# Patient Record
Sex: Female | Born: 1960 | Race: White | Marital: Single | Smoking: Former smoker
Health system: Southern US, Community
[De-identification: ages and names within clinical notes are randomized; demographics above are authoritative.]

## PROBLEM LIST (undated history)

## (undated) DIAGNOSIS — Z9359 Other cystostomy status: Secondary | ICD-10-CM

## (undated) DIAGNOSIS — N390 Urinary tract infection, site not specified: Secondary | ICD-10-CM

## (undated) DIAGNOSIS — R0602 Shortness of breath: Secondary | ICD-10-CM

## (undated) DIAGNOSIS — M797 Fibromyalgia: Secondary | ICD-10-CM

## (undated) DIAGNOSIS — G609 Hereditary and idiopathic neuropathy, unspecified: Secondary | ICD-10-CM

## (undated) DIAGNOSIS — G479 Sleep disorder, unspecified: Secondary | ICD-10-CM

## (undated) DIAGNOSIS — I10 Essential (primary) hypertension: Secondary | ICD-10-CM

## (undated) DIAGNOSIS — R41840 Attention and concentration deficit: Secondary | ICD-10-CM

## (undated) DIAGNOSIS — F332 Major depressive disorder, recurrent severe without psychotic features: Secondary | ICD-10-CM

## (undated) DIAGNOSIS — Z9289 Personal history of other medical treatment: Secondary | ICD-10-CM

## (undated) DIAGNOSIS — A6920 Lyme disease, unspecified: Secondary | ICD-10-CM

## (undated) DIAGNOSIS — D509 Iron deficiency anemia, unspecified: Secondary | ICD-10-CM

## (undated) DIAGNOSIS — IMO0001 Reserved for inherently not codable concepts without codable children: Secondary | ICD-10-CM

## (undated) DIAGNOSIS — R683 Clubbing of fingers: Secondary | ICD-10-CM

## (undated) DIAGNOSIS — R160 Hepatomegaly, not elsewhere classified: Secondary | ICD-10-CM

## (undated) DIAGNOSIS — M6208 Separation of muscle (nontraumatic), other site: Secondary | ICD-10-CM

## (undated) DIAGNOSIS — E039 Hypothyroidism, unspecified: Secondary | ICD-10-CM

## (undated) DIAGNOSIS — R109 Unspecified abdominal pain: Secondary | ICD-10-CM

## (undated) DIAGNOSIS — K5900 Constipation, unspecified: Secondary | ICD-10-CM

## (undated) DIAGNOSIS — J449 Chronic obstructive pulmonary disease, unspecified: Secondary | ICD-10-CM

## (undated) DIAGNOSIS — E876 Hypokalemia: Secondary | ICD-10-CM

## (undated) DIAGNOSIS — K219 Gastro-esophageal reflux disease without esophagitis: Secondary | ICD-10-CM

## (undated) DIAGNOSIS — F419 Anxiety disorder, unspecified: Secondary | ICD-10-CM

## (undated) DIAGNOSIS — G8929 Other chronic pain: Secondary | ICD-10-CM

## (undated) DIAGNOSIS — G932 Benign intracranial hypertension: Secondary | ICD-10-CM

## (undated) DIAGNOSIS — G4733 Obstructive sleep apnea (adult) (pediatric): Secondary | ICD-10-CM

## (undated) DIAGNOSIS — G894 Chronic pain syndrome: Secondary | ICD-10-CM

## (undated) DIAGNOSIS — E1159 Type 2 diabetes mellitus with other circulatory complications: Secondary | ICD-10-CM

## (undated) DIAGNOSIS — R262 Difficulty in walking, not elsewhere classified: Secondary | ICD-10-CM

## (undated) DIAGNOSIS — R531 Weakness: Secondary | ICD-10-CM

## (undated) DIAGNOSIS — E119 Type 2 diabetes mellitus without complications: Secondary | ICD-10-CM

## (undated) DIAGNOSIS — R609 Edema, unspecified: Secondary | ICD-10-CM

## (undated) DIAGNOSIS — Z9981 Dependence on supplemental oxygen: Secondary | ICD-10-CM

## (undated) DIAGNOSIS — R251 Tremor, unspecified: Secondary | ICD-10-CM

## (undated) DIAGNOSIS — D649 Anemia, unspecified: Secondary | ICD-10-CM

## (undated) DIAGNOSIS — M533 Sacrococcygeal disorders, not elsewhere classified: Secondary | ICD-10-CM

## (undated) HISTORY — PX: DENTAL RESTORATION/EXTRACTION WITH X-RAY: SHX5796

## (undated) HISTORY — DX: Type 2 diabetes mellitus with other circulatory complications: E11.59

## (undated) HISTORY — DX: Iron deficiency anemia, unspecified: D50.9

## (undated) HISTORY — DX: Sacrococcygeal disorders, not elsewhere classified: M53.3

## (undated) HISTORY — DX: Hepatomegaly, not elsewhere classified: R16.0

## (undated) HISTORY — DX: Separation of muscle (nontraumatic), other site: M62.08

## (undated) HISTORY — DX: Major depressive disorder, recurrent severe without psychotic features: F33.2

## (undated) HISTORY — DX: Clubbing of fingers: R68.3

## (undated) HISTORY — DX: Hereditary and idiopathic neuropathy, unspecified: G60.9

## (undated) HISTORY — DX: Type 2 diabetes mellitus without complications: E11.9

## (undated) HISTORY — PX: APPENDECTOMY: SHX54

## (undated) HISTORY — DX: Unspecified abdominal pain: R10.9

## (undated) HISTORY — DX: Attention and concentration deficit: R41.840

## (undated) HISTORY — PX: OTHER SURGICAL HISTORY: SHX169

## (undated) HISTORY — DX: Constipation, unspecified: K59.00

## (undated) HISTORY — DX: Difficulty in walking, not elsewhere classified: R26.2

## (undated) HISTORY — DX: Chronic pain syndrome: G89.4

## (undated) HISTORY — DX: Anxiety disorder, unspecified: F41.9

## (undated) HISTORY — DX: Dependence on supplemental oxygen: Z99.81

## (undated) HISTORY — PX: FOOT SURGERY: SHX648

## (undated) HISTORY — DX: Tremor, unspecified: R25.1

## (undated) HISTORY — DX: Benign intracranial hypertension: G93.2

## (undated) HISTORY — DX: Essential (primary) hypertension: I10

## (undated) HISTORY — DX: Hypokalemia: E87.6

## (undated) HISTORY — DX: Edema, unspecified: R60.9

## (undated) HISTORY — DX: Other cystostomy status: Z93.59

## (undated) HISTORY — DX: Reserved for inherently not codable concepts without codable children: IMO0001

---

## 1991-12-28 DIAGNOSIS — A6920 Lyme disease, unspecified: Secondary | ICD-10-CM

## 1991-12-28 HISTORY — DX: Lyme disease, unspecified: A69.20

## 2012-03-02 DIAGNOSIS — Z993 Dependence on wheelchair: Secondary | ICD-10-CM | POA: Insufficient documentation

## 2012-09-18 DIAGNOSIS — E662 Morbid (severe) obesity with alveolar hypoventilation: Secondary | ICD-10-CM | POA: Insufficient documentation

## 2013-10-19 ENCOUNTER — Non-Acute Institutional Stay (SKILLED_NURSING_FACILITY): Payer: Medicare Other | Admitting: Internal Medicine

## 2013-10-19 DIAGNOSIS — I10 Essential (primary) hypertension: Secondary | ICD-10-CM

## 2013-10-19 DIAGNOSIS — E1159 Type 2 diabetes mellitus with other circulatory complications: Secondary | ICD-10-CM

## 2013-10-19 DIAGNOSIS — R300 Dysuria: Secondary | ICD-10-CM

## 2013-10-19 DIAGNOSIS — J441 Chronic obstructive pulmonary disease with (acute) exacerbation: Secondary | ICD-10-CM

## 2013-10-19 DIAGNOSIS — K5900 Constipation, unspecified: Secondary | ICD-10-CM

## 2013-10-19 DIAGNOSIS — F32A Depression, unspecified: Secondary | ICD-10-CM | POA: Insufficient documentation

## 2013-10-19 DIAGNOSIS — G609 Hereditary and idiopathic neuropathy, unspecified: Secondary | ICD-10-CM

## 2013-10-19 DIAGNOSIS — G894 Chronic pain syndrome: Secondary | ICD-10-CM | POA: Insufficient documentation

## 2013-10-19 DIAGNOSIS — E039 Hypothyroidism, unspecified: Secondary | ICD-10-CM

## 2013-10-19 DIAGNOSIS — E1151 Type 2 diabetes mellitus with diabetic peripheral angiopathy without gangrene: Secondary | ICD-10-CM | POA: Insufficient documentation

## 2013-10-19 DIAGNOSIS — D500 Iron deficiency anemia secondary to blood loss (chronic): Secondary | ICD-10-CM

## 2013-10-19 DIAGNOSIS — F3289 Other specified depressive episodes: Secondary | ICD-10-CM

## 2013-10-19 DIAGNOSIS — F329 Major depressive disorder, single episode, unspecified: Secondary | ICD-10-CM

## 2013-10-19 DIAGNOSIS — G4733 Obstructive sleep apnea (adult) (pediatric): Secondary | ICD-10-CM

## 2013-10-19 DIAGNOSIS — E1149 Type 2 diabetes mellitus with other diabetic neurological complication: Secondary | ICD-10-CM

## 2013-10-19 HISTORY — DX: Hereditary and idiopathic neuropathy, unspecified: G60.9

## 2013-10-19 HISTORY — DX: Chronic pain syndrome: G89.4

## 2013-10-19 HISTORY — DX: Constipation, unspecified: K59.00

## 2013-10-19 HISTORY — DX: Type 2 diabetes mellitus with other circulatory complications: E11.59

## 2013-10-19 NOTE — Progress Notes (Signed)
Patient ID: [name], female   DOB: 31-Dec-1960, 52 y.o.   MRN: [id number]  This is an acute visit.  I will care skilled.  Facilities [facility].  Chief complaint-acute visit status post transfer to our service from another facility  History of present illness.  Patient is a 52 year old female with a very complex medical history.  She had been in a skilled facility in [place] and is transferring here to be closer to her parents who live in [place].  She apparently has an extensive medical history including morbid obesity fibromyalgia chronic fatigue-also a history of diabetes type 2 hypothyroidism anemia which has required transfusion in the past.  Also a history of COPD and respiratory failure complicated at times with pneumonia.  She appears to have an extensive history of pain control and i her medications include methadone as well as oxycodone 10 mg every 8 hours-- also is receiving a muscle relaxer.   she does complain of constipation at times--she did receive an enema last night apparently with good results said she had some relief she is on numerous agenst  She also has a history of hypertension blood pressures currently appear to be fairly well controlled most recently 128/70 132/76.  She is a type II diabetic she is on NovoLog 70 30---34 units blood sugars so far in the higher 100 lower 200 range at this point will monitor.  Today her main complaint is some dysuria she does have a suprapubic catheter that is draining light amber colored urine-.  She says she has a history of frequent UTIs.  Family medical social history.  As stated above-patient is single she did have a partner in [place] apparently that relationship has ended.  Her parents living [place] and that is why apparently she is here  Previous medical history.  History of morbid obesity.  Diabetes type 2.  Hypothyroidism.  Hypertension.  COPD.  Pneumonia.  Constipation.  Chronic  pain.  Coronary artery disease.   appears to have a history of neurogenic bladder  Review of systems.  In general no complaints of fever or chills.  Skin-does not complaining of itching or bruising today.  Eyes-states she thinks she has diabetic retinopathy-says occasionally she will see black spots in her visual fields this has been going on for several months-also complains of dry eyes.  Throat-complains of dry throat but does not complaining of dysphagia.  Respiratory does not complaining of shortness of breath or cough-she does have history of COPD.  Cardiac-does not complaining of chest pain appears to have some history of coronary artery disease has when necessary nitroglycerin.  GI-history of significant constipation on numerous agents apparently had a large bowel movement yesterday although has still concerned about constipation.  GU-appears to have some history of neurogenic bladder with a suprapubic catheter does complain of some dysuria.  Muscle skeletal has diffuse joint pain this is chronic on numerous agents as noted above.  Neurologic history of peripheral neuropathy she says the Neurontin helps significantly does not complaining of headache or dizziness.  Psych does have a history of significant depression is on Paxil as well as Wellbutrin.  Physical exam.  Temperature 98.6 pulse 90 respirations 18 blood pressure 128/70 her weight is 287.  In general this is a pleasant morbidly obese middle-aged female in no distress resting comfortably in bed.  Her skin is warm and dry.  Eyes she does have prescription lenses pupils are. We'll round reactive light sclera and conjunctiva are clear extraocular movements intact visual acuity  appears grossly intact.  Oropharynx is clear she has dentures upper and lower plates mucous membranes moist.  Chest is clear to auscultation with somewhat reduced breath sounds I suspect this is due to obesity could not appreciate any  rhonchi rales or wheezes.  Heart is regular rate and rhythm without murmur gallop or rub.  Abdomen she is morbidly obese protuberant soft with positive bowel sounds.  GU-he does have a suprapubic catheter there is some suprapubic tenderness it is draining amber colored urine.  Muscle skeletal does move all extremities x4 has obese changes to her lower extremities which limits her range of motion.  Neurologic difficult exam patient is very sensitive lower extremities when areas attempted to be palpated--clinically pulling her legs away.   Circulation-appear to have somewhat reduced peripheral pulses bilaterally.Marland Kitchen  Psych she is alert and oriented x3 pleasant and appropriate.  Labs none available from the current data we have received from previous nursing facility.  Assessment and plan.  #1 history of chronic pain-she is on numerous agents including methadone 30 mg every 8 hours as well as oxycodone 10 mg every 8 hours-he also continues on Zanaflex when necessary which appear she takes about once a day continue to monitor I suspect this will be a challenge.  #2 constipation again she is on numerous agents including Colace fleets enema every 3 days when necessary as well as Dulcolax milk of magnesia when necessary she is also receiving -she is still complaining of some constipation Will obtain an abdominal x-ray to rule out anything acute.  #3 hypertension-at this point appears to be fairly well controlled she is  HCTZ on clonidine when necessary--will check a metabolic panel  #4-dysuria-she does have a significant history of UTIs per patient's report with I suspect a neurogenic bladder well check a urine culture.  #5-diabetes type 2-she is on Novolog-70//30-since we have minimal data will continue to monitor for now.  #6 history of neuropathy-she is on Neurontin she says this helps significantly we will continue to monitor.  #7-history of dry eyes-will order topical eyedrops and  monitor.  #8 history of hypothyroidism she is on Synthroid we'll update ATS H.  #9 history COPD she does continue on nebulizers at this point appears stable.  #10-history of depression I suspect this is been quite significant she does continue on Paxil as well as Wellbutrin.  Of note we'll order a CBC CMP and TSH for updated values  and hopefully give [place] an indication of what her baseline might be  [id number]-of note greater than 45 minutes spent assessing patient-discussing her status with nursing staff as well with the patient at bedside-and coordinating and formulating a plan of care for numerous diagnoses    .

## 2013-10-23 ENCOUNTER — Non-Acute Institutional Stay (SKILLED_NURSING_FACILITY): Payer: Medicare Other | Admitting: Internal Medicine

## 2013-10-23 DIAGNOSIS — J9610 Chronic respiratory failure, unspecified whether with hypoxia or hypercapnia: Secondary | ICD-10-CM

## 2013-10-23 DIAGNOSIS — IMO0001 Reserved for inherently not codable concepts without codable children: Secondary | ICD-10-CM

## 2013-10-23 DIAGNOSIS — M797 Fibromyalgia: Secondary | ICD-10-CM

## 2013-10-23 DIAGNOSIS — G4733 Obstructive sleep apnea (adult) (pediatric): Secondary | ICD-10-CM

## 2013-10-23 DIAGNOSIS — D500 Iron deficiency anemia secondary to blood loss (chronic): Secondary | ICD-10-CM
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
## 2013-10-27 DIAGNOSIS — F332 Major depressive disorder, recurrent severe without psychotic features: Secondary | ICD-10-CM

## 2013-10-27 HISTORY — DX: Major depressive disorder, recurrent severe without psychotic features: F33.2

## 2013-10-30 ENCOUNTER — Non-Acute Institutional Stay (SKILLED_NURSING_FACILITY): Payer: Medicare Other | Admitting: Internal Medicine

## 2013-10-30 DIAGNOSIS — D500 Iron deficiency anemia secondary to blood loss (chronic): Secondary | ICD-10-CM

## 2013-10-30 NOTE — Progress Notes (Signed)
Patient ID: [name], female   DOB: 11-Jan-1961, 52 y.o.   MRN: [id number] Facility; [facility] Chief complaint followup anemia History; this is a patient I admitted to the facility from another nursing facility. She is a medically complex 52 year old woman who arrived with virtually no useful medical information. Her medical issues was fairly severe anemia and a history of being transfused on 5 different occasions. She gives a history of both bleeding from chronic interstitial cystitis with a chronic Foley catheter as well as rectal bleeding. She has not allowed a rectal exam and has not allowed the nurses to do a digital guaiac test her hemoglobin on November 3 was 6.9 MCV of 74.6 MCHC of 21.4 white count is 5.7 platelet count at 258 differential count on the white count is normal. I had put out a fairly urgent request for medical records. I have received a discharge summary from [facility] in [place]. This is dated 08/18/2013. With regards to her anemia her hemoglobin was 7.5 on presentation to their emergency room. There is no further information on this it is not stated she was transfused she was listed as being on ferrous sulfate at 325 twice a day as well as Protonix and Carafate. However the patient tells me she will not take either oral or intravenous iron. She states she has a severe "almost allergic reaction to both of these" therefore she has been refusing the oral iron that she was on on arrival here. Furthermore she tells me that she does not wish to be transfused at least at this moment. She states she does not have enough quality of life to justify prolonging it. Nor will she except iron in any form.   Physical examination O2 sat is 96% on 2 L respirations 18 pulse rate 77 Cardiac 2/6 pansystolic murmur noted at the lower left sternal border. Jugular venous pressure is not elevated. Mental status; clearly an element of depression here note that she has been seen by  psychiatry in the facility started on Effexor on October 28  Imession/plan #1 microcytic hypochromic anemia; at this point I am not completely certain whether this is iron deficiency or anemia of chronic disease. The physician notes from presumably a discharge summary from [facility] states this is chronic disease. Patient is not allowing stool guaiacs, nor does she currently want to be transfused although I will leave this open for further discussion with her. She will not take iron in any form. I note that her reticulocyte percent was 3.5% [slightly elevated] however absolute reticulocyte count is 113.1. Vitamin B12 and folate are pending. I will repeat her hemoglobin and iron studies in 2 weeks and attempt to discuss this with her again. Patient states she has actually had troubles with anemia since her teenage years. Nevertheless I am troubled by the absence of a concrete diagnosis.

## 2013-11-12 ENCOUNTER — Other Ambulatory Visit: Payer: Self-pay | Admitting: *Deleted

## 2013-11-12 MED ORDER — OXYCODONE HCL 10 MG PO TABS: ORAL_TABLET | ORAL | Status: DC

## 2013-11-13 ENCOUNTER — Non-Acute Institutional Stay (SKILLED_NURSING_FACILITY): Payer: Medicare Other | Admitting: Internal Medicine

## 2013-11-13 DIAGNOSIS — F32A Depression, unspecified: Secondary | ICD-10-CM

## 2013-11-13 DIAGNOSIS — F329 Major depressive disorder, single episode, unspecified: Secondary | ICD-10-CM

## 2013-11-13 DIAGNOSIS — D500 Iron deficiency anemia secondary to blood loss (chronic): Secondary | ICD-10-CM

## 2013-11-13 DIAGNOSIS — F3289 Other specified depressive episodes: Secondary | ICD-10-CM

## 2013-11-13 NOTE — Progress Notes (Signed)
Patient ID: [name], female   DOB: 06-20-1961, 52 y.o.   MRN: [id number]  Facility; [facility] Chief complaint followup anemia History; this is a patient I admitted to the facility from another nursing facility. She is a medically complex 52 year old woman who arrived with virtually no useful medical information. Her medical issues was fairly severe anemia and a history of being transfused on 5 different occasions. She gives a history of both bleeding from chronic interstitial cystitis with a chronic Foley catheter as well as rectal bleeding. She has not allowed a rectal exam and has not allowed the nurses to do a digital guaiac test her hemoglobin on November 3 was 6.9 MCV of 74.6 MCHC of 21.4 white count is 5.7 platelet count at 258 differential count on the white count is normal. I had put out a fairly urgent request for medical records. I have received a discharge summary from [facility] in [place]. This is dated 08/18/2013. With regards to her anemia her hemoglobin was 7.5 on presentation to their emergency room. There is no further information on this it is not stated she was transfused she was listed as being on ferrous sulfate at 325 twice a day as well as Protonix and Carafate. However the patient tells me she will not take either oral or intravenous iron. She states she has a severe "almost allergic reaction to both of these" therefore she has been refusing the oral iron that she was on on arrival here. Furthermore she tells me that she does not wish to be transfused at least at this moment. She states she does not have enough quality of life to justify prolonging it. Nor will she except iron in any form.   Followup lab work shows a serum iron of less than 10, ferritin level is 3 sedimentation rate of 85 hemoglobin of 6.9, white count of 6.4 platelet count of 233,000 her MCV is 75 MCHC of 29 ALT is from November 17. Once again the patient reiterates today that she has been iron  deficient since her teenage years. She states her mother had the same thing. He had some form of allergic reaction to IV iron and some form of reaction to oral iron [question vomiting]. I tried to have a discussion with her that vomiting does not not necessarily preclude reattempting oral iron perhaps in a different form however she will not agree to this. Nor is she willing to entertain the idea of a transfusion at the moment although she wishes to leave that discussion open for another day  Physical examination O2 sat is 8% on 2 L respirations 18 pulse rate 82 Cardiac 2/6 pansystolic murmur noted at the lower left sternal border. Jugular venous pressure is not elevated. Abdomen; she has a large ventral hernia however this reduces easily. Her abdomen is otherwise distended. But no clear masses Mental status; clearly an element of depression here note that she has been seen by psychiatry in the facility started on Effexor on October 28  Imession/plan #1 microcytic hypochromic anemia; subsequent lab work has verified that this woman clearly has iron deficiency anemia. However the exact diagnosis is unclear. She has not allowed stool guaiacs. She will not agree to iron in any form of IV or oral. Furthermore she is steadfast in the idea that she does not want to be transfused. The exact diagnosis here is unclear, it is unlikely that this is malignancy given the length of time that this patient states that she has had this  problem [teenager]. She states she has been extensively evaluated in the past however I don't have any of these records. I will recheck her hemoglobin again in a month's time and rediscuss this with her. She will not allow me to do anything to stabilize her hemoglobin I can't see in attempting any further investigations. Otherwise certainly agree that she is depressed I am not at the moment thinking that this is an incompetent decision to refuse transfusion

## 2013-11-14 ENCOUNTER — Other Ambulatory Visit: Payer: Self-pay | Admitting: *Deleted

## 2013-11-14 MED ORDER — METHADONE HCL 10 MG PO TABS: ORAL_TABLET | ORAL | Status: DC

## 2013-11-27 ENCOUNTER — Non-Acute Institutional Stay (SKILLED_NURSING_FACILITY): Payer: Medicare Other | Admitting: Internal Medicine

## 2013-11-27 DIAGNOSIS — D500 Iron deficiency anemia secondary to blood loss (chronic): Secondary | ICD-10-CM

## 2013-11-27 DIAGNOSIS — M797 Fibromyalgia: Secondary | ICD-10-CM

## 2013-11-27 DIAGNOSIS — IMO0001 Reserved for inherently not codable concepts without codable children: Secondary | ICD-10-CM

## 2013-11-27 NOTE — Progress Notes (Signed)
Patient ID: [name], female   DOB: 1961-02-20, 52 y.o.   MRN: [id number] Facility ; [name] Chief complaint; multiple medical complaint History; this is a medically complex 52 year old woman I ask me received in transfer from another facility I believe in September of this year. Among her most significant medical issues is apparently chronic iron deficiency anemia for which the patient absolutely refuses iron and in the form including No oral iron, IV iron and she is not currently allowing transfusions although she has been transfused 5 times in the past according to her. I am following up on her hemoglobin in roughly 2 weeks. Which time I will attempt to rediscuss things with her. Her last hemoglobin was 7 on November 4 she has microcytic hypochromic anemia and iron studies that strongly suggest iron deficiency anemia with a percent saturation of 6% B12 and folate normal  She also has a chronic pain syndrome and has been on long-standing methadone 30 mg 3 times a day. I suspect this is probably a combination of osteoarthritis and fibromyalgia. The patient is adamant today that she has inadequate pain control and asked that she have every 4 when necessary analgesia which I don't think is unreasonable in her setting  She has not allowed any form of workup of her anemia including no stool guaiacs, as mentioned no transfusions or by mouth or IV iron and in light of this referral to other physicians in consultation would seem unreasonable. She's been followed by psychiatry in the facility and there've been adjustments to those medications and in general see seems better now on Effexor rather than Paxil  Impression/plan #1 severe chronic pain on methadone chronically I think it is reasonable to increase her oxycodone when necessary #2 iron deficiency anemia with a history of rectal bleeding and apparently chronic bleeding in her urinary tract according to patient; I have no information on any of  this #3 chronic pruritus this may be narcotic induced however the patient is having none of this. I'll give her something when necessary

## 2013-12-03 ENCOUNTER — Other Ambulatory Visit: Payer: Self-pay | Admitting: *Deleted

## 2013-12-03 MED ORDER — OXYCODONE HCL 10 MG PO TABS: ORAL_TABLET | ORAL | Status: DC

## 2013-12-14 ENCOUNTER — Other Ambulatory Visit: Payer: Self-pay | Admitting: *Deleted

## 2013-12-14 MED ORDER — METHADONE HCL 10 MG PO TABS: ORAL_TABLET | ORAL | Status: DC

## 2014-01-10 ENCOUNTER — Other Ambulatory Visit: Payer: Self-pay | Admitting: *Deleted

## 2014-01-10 MED ORDER — METHADONE HCL 10 MG PO TABS: ORAL_TABLET | ORAL | Status: DC

## 2014-01-15 ENCOUNTER — Non-Acute Institutional Stay (SKILLED_NURSING_FACILITY): Payer: Medicare Other | Admitting: Internal Medicine

## 2014-01-15 DIAGNOSIS — G4733 Obstructive sleep apnea (adult) (pediatric): Secondary | ICD-10-CM

## 2014-01-15 DIAGNOSIS — D500 Iron deficiency anemia secondary to blood loss (chronic): Secondary | ICD-10-CM

## 2014-01-15 NOTE — Progress Notes (Signed)
Patient ID: Karen BarrioChristy Walls, female   DOB: 09/23/1961, 53 y.o.   MRN: [id number] Facility ; [facility] Chief complaint; multiple medical complaints History; this is a medically complex 53 year old woman we received in transfer from another facility I believe in September of 2014. Among her most significant medical issues is apparently chronic iron deficiency anemia for which the patient absolutely refuses iron in form including No oral iron, IV iron and she is not currently allowing transfusions although she has been transfused 5 times in the past according to her. Her last hemoglobin was on 12/17 at which time her hemoglobin was 7 MCV 74.5 MCHC 21.5 her potassium was 3.3 total CO2 at 37. The patient clearly has iron deficiency with a ferritin of 3 an iron of less than 10 both of these on 11/17  She also has a chronic pain syndrome and has been on long-standing methadone 30 mg 3 times a day. I suspect this is probably a combination of osteoarthritis and fibromyalgia. The patient is adamant today that she has inadequate pain control and asked that she have every 4 when necessary analgesia which I don't think is unreasonable in her setting  She has not allowed any form of workup of her anemia including no stool guaiacs, as mentioned no transfusions or by mouth or IV iron and in light of this referral to other physicians in consultation would seem unreasonable. She's been followed by psychiatry in the facility and there've been adjustments to those medications and in general see seems better now on Effexor rather than Paxil   recent issues include anemia increasing shortness of breath, skin lesions on her scalp with generalized scalp pruritus but no history of psoriasis or seborrheic dermatitis.  Physical Exam; O2 sat 95% on 2 L respirations 18 and unlabored pulse rate 75 Respiratory shallow air entry bilaterally but no crackles or wheezes Cardiac heart sounds are distant no murmurs Lymph; in spite of  the patient's claim of swollen lymph nodes in her left cervical chain I feel nothing here there are no masses Skin; on the anterior part of her scalp in the midline and proximal to the her hairline her several ulcerations. A careful inspection of her scalp does not show evidence of any particular lesions although there may be a similar lesion to the front of her scalp at the top of her occiput. There is no evidence of a particular skin the lesion on her scalp nor evidence of an infestation.   Impression/plan #1 severe chronic pain on methadone chronically I think it is reasonable to increase her oxycodone when necessary #2 iron deficiency anemia with a history of rectal bleeding and apparently chronic bleeding in her urinary tract according to patient; I have no information on any of this. The patient does not allowed even a rudimentary workup. I will recheck her hemoglobin periodicly #3 scalp lesions. I am really uncertain about whether what I am seeing is the primary lesion or as a result of her scratching. I am going to treat her empirically for seborrheic dermatitis although the evidence for this seems scant. She will apparently not tolerate topical steroids

## 2014-02-07 ENCOUNTER — Other Ambulatory Visit: Payer: Self-pay | Admitting: *Deleted

## 2014-02-07 MED ORDER — OXYCODONE HCL 10 MG PO TABS: ORAL_TABLET | ORAL | Status: DC

## 2014-02-07 NOTE — Telephone Encounter (Signed)
[facility] 

## 2014-02-13 ENCOUNTER — Other Ambulatory Visit: Payer: Self-pay | Admitting: *Deleted

## 2014-02-13 MED ORDER — METHADONE HCL 10 MG PO TABS: ORAL_TABLET | ORAL | Status: DC

## 2014-02-13 NOTE — Telephone Encounter (Signed)
[facility] 

## 2014-02-18 ENCOUNTER — Non-Acute Institutional Stay (SKILLED_NURSING_FACILITY): Payer: Medicare Other | Admitting: Nurse Practitioner

## 2014-02-18 DIAGNOSIS — F32A Depression, unspecified: Secondary | ICD-10-CM

## 2014-02-18 DIAGNOSIS — L989 Disorder of the skin and subcutaneous tissue, unspecified: Secondary | ICD-10-CM

## 2014-02-18 DIAGNOSIS — G609 Hereditary and idiopathic neuropathy, unspecified: Secondary | ICD-10-CM

## 2014-02-18 DIAGNOSIS — J449 Chronic obstructive pulmonary disease, unspecified: Secondary | ICD-10-CM

## 2014-02-18 DIAGNOSIS — K5900 Constipation, unspecified: Secondary | ICD-10-CM

## 2014-02-18 DIAGNOSIS — I10 Essential (primary) hypertension: Secondary | ICD-10-CM

## 2014-02-18 DIAGNOSIS — F329 Major depressive disorder, single episode, unspecified: Secondary | ICD-10-CM

## 2014-02-18 DIAGNOSIS — F3289 Other specified depressive episodes: Secondary | ICD-10-CM

## 2014-02-18 DIAGNOSIS — E039 Hypothyroidism, unspecified: Secondary | ICD-10-CM

## 2014-02-18 DIAGNOSIS — D509 Iron deficiency anemia, unspecified: Secondary | ICD-10-CM

## 2014-02-18 DIAGNOSIS — E1159 Type 2 diabetes mellitus with other circulatory complications: Secondary | ICD-10-CM

## 2014-02-18 DIAGNOSIS — R300 Dysuria: Secondary | ICD-10-CM

## 2014-02-18 DIAGNOSIS — G894 Chronic pain syndrome: Secondary | ICD-10-CM

## 2014-02-18 HISTORY — DX: Iron deficiency anemia, unspecified: D50.9

## 2014-02-18 NOTE — Progress Notes (Signed)
Patient ID: [name], female   DOB: [birth year], 53 y.o.   MRN: [id number]    Nursing Home Location:  [place] of Service: SNF (31)  PCP: No primary provider on file.  Allergies not on file  Chief Complaint  Patient presents with  . Medical Managment of Chronic Issues    HPI:  53 year old female with a very complex medical history who is being seen today for routine follow up; pt moved to from a skilled facility in [place] and is transferring here to be closer to her parents who live in [place]. medical history includes HTN, morbid obesity, COPD, fibromyalgia and chronic pain, diabetes type 2, hypothyroidism,  anemia which has required transfusion in the past but is current refusing, she reports she wants to be transferred to an assisted living facility and then she may be willing to consider iron or an transfusion at that point however pt is unable to take care of herself in a assisted living environment; pt reports she needs therapy due to ongoing depression and anxiety, assures me she is not suicidal but tired of living the way she is living; pt is currently being seen by psych services at the facility  . Pt also complains about severe constipation complicated by narcotic use due to chronic pain Most recent complaints are dysuria she does have a suprapubic catheter and has a UA C&S pending; and ongoing scalp lesion but does not tolerate steroid cream and current shampoo is not helping  Pt also report worsening shortness of breath and chest pains; realizes her hgb is low and this is most likely the cause but refuses iron or transfusion.  Review of Systems:  Review of Systems  Constitutional: Negative for fever and chills.  Respiratory: Positive for shortness of breath (COPD; ongoing but progressively worse, no SHORTESS OF breath during  exam or interview ). Negative for cough and sputum production.   Cardiovascular: Positive for chest pain. Negative for  leg swelling.  Gastrointestinal: Positive for constipation. Negative for heartburn, abdominal pain and diarrhea.  Genitourinary: Positive for dysuria. Negative for hematuria and flank pain.  Musculoskeletal: Negative for myalgias.  Skin: Negative for itching and rash.  Neurological: Negative for dizziness and headaches.  Psychiatric/Behavioral: Positive for depression. The patient is nervous/anxious.      Medications: [name] Medications  New Prescriptions   No medications on file  Previous Medications   METHADONE (DOLOPHINE) 10 MG TABLET    Take three tablets by mouth every eight hours for pain   OXYCODONE HCL 10 MG TABS    Take one tablet by mouth every 4 hours as needed  Modified Medications   No medications on file  Discontinued Medications   No medications on file     Physical Exam: Blood pressure 127/66, HR 85, RR 20, Temp 97.6 Physical Exam  Constitutional: She is oriented to person, place, and time and well-developed, well-nourished, and in no distress.  HENT:  Mouth/Throat: Oropharynx is clear and moist. No oropharyngeal exudate.  Eyes: Conjunctivae and EOM are normal. Pupils are equal, round, and reactive to light.  Neck: Normal range of motion. Neck supple.  Cardiovascular: Normal rate, regular rhythm and normal heart sounds.   Pulmonary/Chest: Effort normal and breath sounds normal. No respiratory distress.  Abdominal: Soft. Bowel sounds are normal.  Musculoskeletal: She exhibits no edema.  Neurological: She is alert and oriented to person, place, and time.  Skin: Skin is warm and dry. Rash (to scalp) noted.  Labs reviewed: CBC NO Diff (Complete Blood Count)    Result: 10/21/2013 9:38 PM   ( Status: F )       WBC 6.3     4.0-10.5 K/uL SLN   RBC 3.52   L 3.87-5.11 MIL/uL SLN   Hemoglobin 7.7   L 12.0-15.0 g/dL SLN   Hematocrit 26.2   L 36.0-46.0 % SLN   MCV 74.4   L 78.0-100.0 fL SLN   MCH 21.9   L 26.0-34.0 pg SLN   MCHC 29.4    L 30.0-36.0 g/dL SLN   RDW 18.0   H 11.5-15.5 % SLN   Platelet Count 319     150-400 K/uL SLN   Comprehensive Metabolic Panel    Result: 10/21/2013 7:24 PM   ( Status: F )       Sodium 140     135-145 mEq/L SLN   Potassium 3.2   L 3.5-5.3 mEq/L SLN   Chloride 95   L 96-112 mEq/L SLN   CO2 37   H 19-32 mEq/L SLN   Glucose 155   H 70-99 mg/dL SLN   BUN 10     6-23 mg/dL SLN   Creatinine 0.91     0.50-1.10 mg/dL SLN   Bilirubin, Total 0.5     0.3-1.2 mg/dL SLN   Alkaline Phosphatase 73     39-117 U/L SLN   AST/SGOT 17     0-37 U/L SLN   ALT/SGPT 18     0-35 U/L SLN   Total Protein 7.3     6.0-8.3 g/dL SLN   Albumin 3.8     3.5-5.2 g/dL SLN   Calcium 8.9     8.4-10.5 mg/dL SLN   TSH, Ultrasensitive    Result: 10/21/2013 7:37 PM   ( Status: F )       TSH 2.519     0.350-4.500 uIU/mL SLN   Hemoglobin A1C    Result: 10/21/2013 10:20 PM   ( Status: F )       Hemoglobin A1C 6.8   H <5.7 % SLN C Estimated Average Glucose 148   H  CBC NO Diff (Complete Blood Count)    Result: 10/29/2013 10:24 AM   ( Status: F )     C WBC 5.7     4.0-10.5 K/uL SLN   RBC 3.23   L 3.87-5.11 MIL/uL SLN   Hemoglobin 6.9   LL 12.0-15.0 g/dL SLN   Hematocrit 24.1   L 36.0-46.0 % SLN   MCV 74.6   L 78.0-100.0 fL SLN   MCH 21.4   L 26.0-34.0 pg SLN   MCHC 28.6   L 30.0-36.0 g/dL SLN   RDW 17.9   H 11.5-15.5 % SLN   Platelet Count 258     150-400 K/uL SLN   CBC with Diff    Result: 10/29/2013 10:24 AM   ( Status: F )       WBC 5.7     4.0-10.5 K/uL SLN   RBC 3.23   L 3.87-5.11 MIL/uL SLN   Hemoglobin 6.9   LL 12.0-15.0 g/dL SLN   Hematocrit 24.1   L 36.0-46.0 % SLN   MCV 74.6   L 78.0-100.0 fL SLN   MCH 21.4   L 26.0-34.0 pg SLN   MCHC 28.6   L 30.0-36.0 g/dL SLN   RDW 17.9   H 11.5-15.5 % SLN   Platelet Count 258     150-400 K/uL SLN   Granulocyte %  59     43-77 % SLN   Absolute Gran 3.4     1.7-7.7 K/uL SLN   Lymph % 27     12-46 % SLN   Absolute Lymph 1.6     0.7-4.0 K/uL SLN   Mono % 10      3-12 % SLN   Absolute Mono 0.6     0.1-1.0 K/uL SLN   Eos % 3     0-5 % SLN   Absolute Eos 0.2     0.0-0.7 K/uL SLN   Baso % 1     0-1 % SLN   Absolute Baso 0.0     0.0-0.1 K/uL SLN   Smear Review Criteria for review not met  SLN   Reticulocyte Count (RETIC)    Result: 10/29/2013 10:24 AM   ( Status: F )       % Retic 3.5   H 0.4-2.3 % SLN   RBC 3.23   L 3.87-5.11 MIL/uL SLN   ABS Retic 113.1     19.0-186.0 K/uL SLN   Vitamin B12    Result: 10/29/2013 1:38 PM   ( Status: F )       Vitamin B12 559     211-911 pg/mL SLN   Folate    Result: 10/29/2013 1:38 PM   ( Status: F )       Folate 11.1   Iron and IBC    Result: 10/30/2013 10:19 AM   ( Status: F )     C Iron 26   L 42-145 ug/dL SLN   UIBC 381     125-400 ug/dL SLN   TIBC 407     250-470 ug/dL SLN   %SAT 6   L 20-55 % SLN   Hemoglobin    Result: 10/30/2013 11:24 AM   ( Status: F )       Hemoglobin 7.0   L 12.0-15.0 g/dL SLN   Hematocrit    Result: 10/30/2013 11:24 AM   ( Status: F )       Hematocrit 24.4   L 36.0-46.0 % SLN   Vitamin B12    Result: 10/30/2013 3:28 PM   ( Status: F )       Vitamin B12 584     211-911 pg/mL SLN   Folate    Result: 10/30/2013 3:28 PM   ( Status: F )       Folate 11.2      ng/mL SLN  Basic Metabolic Panel    Result: 11/05/2013 11:32 AM   ( Status: F )     C Sodium 139     135-145 mEq/L SLN   Potassium 3.3   L 3.5-5.3 mEq/L SLN   Chloride 94   L 96-112 mEq/L SLN   CO2 37   H 19-32 mEq/L SLN   Glucose 194   H 70-99 mg/dL SLN   BUN 12     6-23 mg/dL SLN   Creatinine 0.99     0.50-1.10 mg/dL SLN   Calcium 8.8     8.4-10.5 mg/dL SLN   Sodium 140     135-145 mEq/L SLN   Potassium 3.4   L 3.5-5.3 mEq/L SLN   Chloride 95   L 96-112 mEq/L SLN   CO2 38   H 19-32 mEq/L SLN   Glucose 192   H 70-99 mg/dL SLN   BUN 13     6-23 mg/dL SLN   Creatinine 1.02  0.50-1.10 mg/dL SLN   Bilirubin, Total 0.4     0.3-1.2 mg/dL SLN   Alkaline Phosphatase 69     39-117 U/L SLN   AST/SGOT 15     0-37 U/L SLN    ALT/SGPT 12     0-35 U/L SLN   Total Protein 6.8     6.0-8.3 g/dL SLN   Albumin 3.5     3.5-5.2 g/dL SLN   Calcium 8.7 CBC NO Diff (Complete Blood Count)    Result: 12/12/2013 12:26 PM   ( Status: F )     C WBC 6.3     4.0-10.5 K/uL SLN   RBC 3.26   L 3.87-5.11 MIL/uL SLN   Hemoglobin 7.0   L 12.0-15.0 g/dL SLN   Hematocrit 24.3   L 36.0-46.0 % SLN   MCV 74.5   L 78.0-100.0 fL SLN   MCH 21.5   L 26.0-34.0 pg SLN   MCHC 28.8   L 30.0-36.0 g/dL SLN   RDW 18.3   H 11.5-15.5 % SLN   Platelet Count 278     150-400 K/uL SLN   Basic Metabolic Panel    Result: 12/12/2013 11:27 AM   ( Status: F )       Sodium 139     135-145 mEq/L SLN   Potassium 3.3   L 3.5-5.3 mEq/L SLN   Chloride 94   L 96-112 mEq/L SLN   CO2 37   H 19-32 mEq/L SLN   Glucose 146   H 70-99 mg/dL SLN   BUN 10     6-23 mg/dL SLN   Creatinine 0.93     0.50-1.10 mg/dL SLN   Calcium 8.8     8.4-10.5 mg/dL SLN   Albumin    Result: 12/14/2013 10:35 AM   ( Status: F )     C Albumin 3.8     3.5-5.2 G/dL Hemoglobin A1C    Result: 01/14/2014 1:23 PM   ( Status: F )     C Hemoglobin A1C 5.7   H <5.7 % SLN C Estimated Average Glucose 117   H <117 mg/dL SLN   CBC NO Diff (Complete Blood Count)    Result: 01/16/2014 12:07 PM   ( Status: F )     C WBC 5.5     4.0-10.5 K/uL SLN   RBC 3.16   L 3.87-5.11 MIL/uL SLN   Hemoglobin 6.8   LL 12.0-15.0 g/dL SLN C Hematocrit 23.2   L 36.0-46.0 % SLN   MCV 73.4   L 78.0-100.0 fL SLN   MCH 21.5   L 26.0-34.0 pg SLN   MCHC 29.3   L 30.0-36.0 g/dL SLN   RDW 18.0   H 11.5-15.5 % SLN   Platelet Count 246     150-400 K/uL SLN  Basic Metabolic Panel    Result: 01/25/2014 10:41 AM   ( Status: F )     C Sodium 139     135-145 mEq/L SLN   Potassium 3.5     3.5-5.3 mEq/L SLN   Chloride 93   L 96-112 mEq/L SLN   CO2 37   H 19-32 mEq/L SLN   Glucose 164   H 70-99 mg/dL SLN   BUN 15     6-23 mg/dL SLN   Creatinine 0.98     0.50-1.10 mg/dL SLN   Calcium 8.8      8.4-10.5 mg/dL SL Assessment/Plan 1. Unspecified constipation - pt reports ongoing IBS and constipation  despite miralax, colace, fleets enema -will start amitiza BID with meals and make miralax and colace PRN at this time for better results with contipation  2. Type II or unspecified type diabetes mellitus with peripheral circulatory disorders, uncontrolled(250.72) -A1c at [facility], pt with SSI, will cont insulin  3. HTN (hypertension) -Patient is stable; continue current regimen. Will monitor and make changes as necessary.  4. Unspecified hereditary and idiopathic peripheral neuropathy -fairly well controlled on gabapentin   5. Chronic pain syndrome -stable on current medication   6. Morbid obesity -conts to eat excessively, RD has met with pt  7. Depression -with anxiety disorder and panic attack; pt being followed by psych   8. Unspecified hypothyroidism -TSH was WNL in oct 2014  9. COPD (chronic obstructive pulmonary disease) -worsening shortness of breath, however her last hgb is 6.6; pt is aware this is contributing to worsening shortness of breath but does not wish for transfusion or supplements. No cough or congestion   10. Anemia, iron deficiency -conts to have a downward trend in hgb does not want supplements or transfusion, said she has had a enough regarding this; will get palliative care consult at this time; pt reports she has been on hospice in the past and is agreeable to this. Hopefully with a palliative care consult better goal of care can be established  11. Scalp lesion -does not want topical steroids; previous treatments have not helped  -will have staff apply mupirocin topically at this time  12. Dysuria Culture and sensitives pending will await results   45 mins in total time time greater than 50% of total time spent doing pt counseled and coordination of care regarding multiple medical diseases

## 2014-02-19 ENCOUNTER — Non-Acute Institutional Stay (SKILLED_NURSING_FACILITY): Payer: Medicare Other | Admitting: Internal Medicine

## 2014-02-19 DIAGNOSIS — K1120 Sialoadenitis, unspecified: Secondary | ICD-10-CM

## 2014-02-19 DIAGNOSIS — N10 Acute tubulo-interstitial nephritis: Secondary | ICD-10-CM

## 2014-02-19 NOTE — Progress Notes (Signed)
Patient ID: [name], female   DOB: 1961-10-15, 53 y.o.   MRN: [id number]  Facility ; [facility] Chief complaint; multiple medical complaints History; this is a medically complex 53 year old woman we received in transfer from another facility I believe in September of 2014. Among her most significant medical issues is apparently chronic iron deficiency anemia for which the patient absolutely refuses iron in any form including No oral iron, IV iron and she is not currently allowing transfusions although she has been transfused 5 times in the past according to her. Her last hemoglobin was on 12/17 at which time her hemoglobin was 6.8 MCV 74.5 MCHC 21.5 her potassium was 3.5 total CO2 at 37. The patient clearly has iron deficiency with a ferritin of 3 an iron of less than 10 both of these on 11/17  She also has a chronic pain syndrome and has been on long-standing methadone 30 mg 3 times a day. I suspect this is probably a combination of osteoarthritis and fibromyalgia. The patient is adamant today that she has inadequate pain control and asked that she have every 4 when necessary analgesia which I don't think is unreasonable in her setting. She states the staff will not give her Tylenol and oxycodone within close proximity of each other her at all try to clarify this.  She has not allowed any form of workup of her anemia including no stool guaiacs, as mentioned no transfusions or by mouth or IV iron and in light of this referral to other physicians in consultation would seem unreasonable. She's been followed by psychiatry in the facility. She is requested to see hospice and palliative care who are seeing her today.  More recently she is complaining of lower abdominal pain, intermittent blood in her Foley catheter. aShe has a culture showing Pseudomonas without sensitivities back yet. She has a significant allergy to penicillin, Levaquin and sulfonamides. She is also complaining of pain at the angle  of her left jaw, generalized headache and diffuse body pain  Physical Exam; Abdomen; obese and distended. There is no overt tenderness and no clear masses GU suprapubic and possibly costovertebral angle tenderness are present HEENT there is indeed significant tenderness and fullness at the angle of her left jaw compatible with possible parotitis Scalp she has a large excoriation on the front of her scalp. I've previously treated her in paraplegia for seborrheic dermatitis although I don't think this is helped. I see no other scalp lesion and I doubt this is scalp psoriasis  Impression/plan #1 severe chronic pain on methadone chronically; she is not want her methadone increased. She would like the oxycodone 5 mg in the Tylenol to be used independently which is reasonable #2 iron deficiency anemia with a history of rectal bleeding and apparently chronic bleeding in her urinary tract according to patient; I have no information on any of this. The patient does not allowed even a rudimentary workup. I will recheck her hemoglobin periodicly #3 scalp lesions; I'm not completely certain what I'm dealing with here whether this is self-induced, or another skin issue. As mentioned I don't think she has psoriasis. #4 Pseudomonas urinary tract infection question pyelonephritis. I'm going to start her on [place]. She is vehementl that she has tolerated cephalosporins in the past [Rocephin]. We'll start her on Fortaz #5 left-sided parotitis I am going to add doxycycline  Once again I have attempted to get this lady to agree to transfusion or iron therapy. She is absolutely opposed to this. She is seeing  hospice although I don't think she is ready to completely give up return to hospital although at this point her refusals would make that probably not very useful.

## 2014-02-22 ENCOUNTER — Emergency Department (HOSPITAL_COMMUNITY): Payer: Medicare Other

## 2014-02-22 ENCOUNTER — Encounter (HOSPITAL_COMMUNITY): Payer: Self-pay | Admitting: Emergency Medicine

## 2014-02-22 ENCOUNTER — Inpatient Hospital Stay (HOSPITAL_COMMUNITY)
Admission: EM | Admit: 2014-02-22 | Discharge: 2014-03-11 | DRG: 699 | Disposition: A | Discharge status: Skilled Nursing Facility | Payer: Medicare Other | Attending: Internal Medicine | Admitting: Internal Medicine

## 2014-02-22 DIAGNOSIS — J449 Chronic obstructive pulmonary disease, unspecified: Secondary | ICD-10-CM

## 2014-02-22 DIAGNOSIS — D500 Iron deficiency anemia secondary to blood loss (chronic): Secondary | ICD-10-CM | POA: Diagnosis present

## 2014-02-22 DIAGNOSIS — E1149 Type 2 diabetes mellitus with other diabetic neurological complication: Secondary | ICD-10-CM | POA: Diagnosis present

## 2014-02-22 DIAGNOSIS — Z7401 Bed confinement status: Secondary | ICD-10-CM

## 2014-02-22 DIAGNOSIS — K5900 Constipation, unspecified: Secondary | ICD-10-CM

## 2014-02-22 DIAGNOSIS — L919 Hypertrophic disorder of the skin, unspecified: Secondary | ICD-10-CM

## 2014-02-22 DIAGNOSIS — F1120 Opioid dependence, uncomplicated: Secondary | ICD-10-CM | POA: Diagnosis present

## 2014-02-22 DIAGNOSIS — Z9119 Patient's noncompliance with other medical treatment and regimen: Secondary | ICD-10-CM

## 2014-02-22 DIAGNOSIS — E1151 Type 2 diabetes mellitus with diabetic peripheral angiopathy without gangrene: Secondary | ICD-10-CM | POA: Diagnosis present

## 2014-02-22 DIAGNOSIS — G894 Chronic pain syndrome: Secondary | ICD-10-CM

## 2014-02-22 DIAGNOSIS — Z91199 Patient's noncompliance with other medical treatment and regimen due to unspecified reason: Secondary | ICD-10-CM

## 2014-02-22 DIAGNOSIS — Z515 Encounter for palliative care: Secondary | ICD-10-CM

## 2014-02-22 DIAGNOSIS — G608 Other hereditary and idiopathic neuropathies: Secondary | ICD-10-CM | POA: Diagnosis present

## 2014-02-22 DIAGNOSIS — M129 Arthropathy, unspecified: Secondary | ICD-10-CM | POA: Diagnosis present

## 2014-02-22 DIAGNOSIS — N390 Urinary tract infection, site not specified: Secondary | ICD-10-CM

## 2014-02-22 DIAGNOSIS — G609 Hereditary and idiopathic neuropathy, unspecified: Secondary | ICD-10-CM

## 2014-02-22 DIAGNOSIS — E039 Hypothyroidism, unspecified: Secondary | ICD-10-CM

## 2014-02-22 DIAGNOSIS — T4275XA Adverse effect of unspecified antiepileptic and sedative-hypnotic drugs, initial encounter: Secondary | ICD-10-CM | POA: Diagnosis present

## 2014-02-22 DIAGNOSIS — G4733 Obstructive sleep apnea (adult) (pediatric): Secondary | ICD-10-CM | POA: Diagnosis present

## 2014-02-22 DIAGNOSIS — Z87891 Personal history of nicotine dependence: Secondary | ICD-10-CM

## 2014-02-22 DIAGNOSIS — E1159 Type 2 diabetes mellitus with other circulatory complications: Secondary | ICD-10-CM

## 2014-02-22 DIAGNOSIS — R509 Fever, unspecified: Secondary | ICD-10-CM | POA: Diagnosis present

## 2014-02-22 DIAGNOSIS — Z79899 Other long term (current) drug therapy: Secondary | ICD-10-CM

## 2014-02-22 DIAGNOSIS — A499 Bacterial infection, unspecified: Secondary | ICD-10-CM | POA: Diagnosis present

## 2014-02-22 DIAGNOSIS — Y846 Urinary catheterization as the cause of abnormal reaction of the patient, or of later complication, without mention of misadventure at the time of the procedure: Secondary | ICD-10-CM | POA: Diagnosis present

## 2014-02-22 DIAGNOSIS — D649 Anemia, unspecified: Secondary | ICD-10-CM

## 2014-02-22 DIAGNOSIS — F329 Major depressive disorder, single episode, unspecified: Secondary | ICD-10-CM

## 2014-02-22 DIAGNOSIS — S0100XA Unspecified open wound of scalp, initial encounter: Secondary | ICD-10-CM | POA: Diagnosis present

## 2014-02-22 DIAGNOSIS — I798 Other disorders of arteries, arterioles and capillaries in diseases classified elsewhere: Secondary | ICD-10-CM | POA: Diagnosis present

## 2014-02-22 DIAGNOSIS — K111 Hypertrophy of salivary gland: Secondary | ICD-10-CM | POA: Diagnosis present

## 2014-02-22 DIAGNOSIS — F332 Major depressive disorder, recurrent severe without psychotic features: Secondary | ICD-10-CM | POA: Diagnosis present

## 2014-02-22 DIAGNOSIS — Z87440 Personal history of urinary (tract) infections: Secondary | ICD-10-CM

## 2014-02-22 DIAGNOSIS — L909 Atrophic disorder of skin, unspecified: Secondary | ICD-10-CM | POA: Diagnosis present

## 2014-02-22 DIAGNOSIS — E876 Hypokalemia: Secondary | ICD-10-CM | POA: Diagnosis present

## 2014-02-22 DIAGNOSIS — K219 Gastro-esophageal reflux disease without esophagitis: Secondary | ICD-10-CM | POA: Diagnosis present

## 2014-02-22 DIAGNOSIS — Z5987 Material hardship due to limited financial resources, not elsewhere classified: Secondary | ICD-10-CM

## 2014-02-22 DIAGNOSIS — B965 Pseudomonas (aeruginosa) (mallei) (pseudomallei) as the cause of diseases classified elsewhere: Secondary | ICD-10-CM | POA: Diagnosis present

## 2014-02-22 DIAGNOSIS — Z6841 Body Mass Index (BMI) 40.0 and over, adult: Secondary | ICD-10-CM

## 2014-02-22 DIAGNOSIS — F22 Delusional disorders: Secondary | ICD-10-CM | POA: Diagnosis present

## 2014-02-22 DIAGNOSIS — Z794 Long term (current) use of insulin: Secondary | ICD-10-CM

## 2014-02-22 DIAGNOSIS — Z8619 Personal history of other infectious and parasitic diseases: Secondary | ICD-10-CM

## 2014-02-22 DIAGNOSIS — F32A Depression, unspecified: Secondary | ICD-10-CM

## 2014-02-22 DIAGNOSIS — I10 Essential (primary) hypertension: Secondary | ICD-10-CM

## 2014-02-22 DIAGNOSIS — R195 Other fecal abnormalities: Secondary | ICD-10-CM | POA: Diagnosis present

## 2014-02-22 DIAGNOSIS — K2970 Gastritis, unspecified, without bleeding: Secondary | ICD-10-CM | POA: Diagnosis present

## 2014-02-22 DIAGNOSIS — T83511A Infection and inflammatory reaction due to indwelling urethral catheter, initial encounter: Principal | ICD-10-CM | POA: Diagnosis present

## 2014-02-22 DIAGNOSIS — B9689 Other specified bacterial agents as the cause of diseases classified elsewhere: Secondary | ICD-10-CM | POA: Diagnosis present

## 2014-02-22 DIAGNOSIS — IMO0001 Reserved for inherently not codable concepts without codable children: Secondary | ICD-10-CM | POA: Diagnosis present

## 2014-02-22 DIAGNOSIS — L989 Disorder of the skin and subcutaneous tissue, unspecified: Secondary | ICD-10-CM

## 2014-02-22 DIAGNOSIS — E1142 Type 2 diabetes mellitus with diabetic polyneuropathy: Secondary | ICD-10-CM | POA: Diagnosis present

## 2014-02-22 DIAGNOSIS — D509 Iron deficiency anemia, unspecified: Secondary | ICD-10-CM

## 2014-02-22 DIAGNOSIS — K2990 Gastroduodenitis, unspecified, without bleeding: Secondary | ICD-10-CM

## 2014-02-22 DIAGNOSIS — Z9089 Acquired absence of other organs: Secondary | ICD-10-CM

## 2014-02-22 DIAGNOSIS — R5381 Other malaise: Secondary | ICD-10-CM | POA: Diagnosis present

## 2014-02-22 DIAGNOSIS — Z598 Other problems related to housing and economic circumstances: Secondary | ICD-10-CM

## 2014-02-22 DIAGNOSIS — Z66 Do not resuscitate: Secondary | ICD-10-CM

## 2014-02-22 DIAGNOSIS — R5383 Other fatigue: Secondary | ICD-10-CM | POA: Diagnosis present

## 2014-02-22 DIAGNOSIS — J4489 Other specified chronic obstructive pulmonary disease: Secondary | ICD-10-CM | POA: Diagnosis present

## 2014-02-22 DIAGNOSIS — K5641 Fecal impaction: Secondary | ICD-10-CM | POA: Diagnosis present

## 2014-02-22 DIAGNOSIS — F411 Generalized anxiety disorder: Secondary | ICD-10-CM | POA: Diagnosis present

## 2014-02-22 HISTORY — DX: Hypothyroidism, unspecified: E03.9

## 2014-02-22 HISTORY — DX: Essential (primary) hypertension: I10

## 2014-02-22 HISTORY — DX: Gastro-esophageal reflux disease without esophagitis: K21.9

## 2014-02-22 HISTORY — DX: Fibromyalgia: M79.7

## 2014-02-22 HISTORY — DX: Urinary tract infection, site not specified: N39.0

## 2014-02-22 HISTORY — DX: Lyme disease, unspecified: A69.20

## 2014-02-22 HISTORY — DX: Chronic obstructive pulmonary disease, unspecified: J44.9

## 2014-02-22 HISTORY — DX: Morbid (severe) obesity due to excess calories: E66.01

## 2014-02-22 HISTORY — DX: Personal history of other medical treatment: Z92.89

## 2014-02-22 HISTORY — DX: Shortness of breath: R06.02

## 2014-02-22 HISTORY — DX: Weakness: R53.1

## 2014-02-22 HISTORY — DX: Sleep disorder, unspecified: G47.9

## 2014-02-22 HISTORY — DX: Other chronic pain: G89.29

## 2014-02-22 HISTORY — DX: Obstructive sleep apnea (adult) (pediatric): G47.33

## 2014-02-22 HISTORY — DX: Anemia, unspecified: D64.9

## 2014-02-22 LAB — HEMOGLOBIN AND HEMATOCRIT, BLOOD
HCT: 26.4 % — ABNORMAL LOW (ref 36.0–46.0)
Hemoglobin: 7.1 g/dL — ABNORMAL LOW (ref 12.0–15.0)

## 2014-02-22 LAB — GLUCOSE, CAPILLARY
GLUCOSE-CAPILLARY: 209 mg/dL — AB (ref 70–99)
Glucose-Capillary: 114 mg/dL — ABNORMAL HIGH (ref 70–99)

## 2014-02-22 LAB — CBC WITH DIFFERENTIAL/PLATELET
Basophils Absolute: 0 10*3/uL (ref 0.0–0.1)
Basophils Relative: 0 % (ref 0–1)
Eosinophils Absolute: 0.1 10*3/uL (ref 0.0–0.7)
Eosinophils Relative: 1 % (ref 0–5)
HCT: 27.6 % — ABNORMAL LOW (ref 36.0–46.0)
Hemoglobin: 7.6 g/dL — ABNORMAL LOW (ref 12.0–15.0)
Lymphocytes Relative: 17 % (ref 12–46)
Lymphs Abs: 1.5 10*3/uL (ref 0.7–4.0)
MCH: 20.9 pg — AB (ref 26.0–34.0)
MCHC: 27.5 g/dL — AB (ref 30.0–36.0)
MCV: 75.8 fL — ABNORMAL LOW (ref 78.0–100.0)
MONO ABS: 0.7 10*3/uL (ref 0.1–1.0)
Monocytes Relative: 8 % (ref 3–12)
NEUTROS PCT: 74 % (ref 43–77)
Neutro Abs: 6.3 10*3/uL (ref 1.7–7.7)
PLATELETS: 281 10*3/uL (ref 150–400)
RBC: 3.64 MIL/uL — AB (ref 3.87–5.11)
RDW: 16.9 % — ABNORMAL HIGH (ref 11.5–15.5)
WBC: 8.6 10*3/uL (ref 4.0–10.5)

## 2014-02-22 LAB — COMPREHENSIVE METABOLIC PANEL
ALT: 62 U/L — ABNORMAL HIGH (ref 0–35)
AST: 61 U/L — ABNORMAL HIGH (ref 0–37)
Albumin: 3.3 g/dL — ABNORMAL LOW (ref 3.5–5.2)
Alkaline Phosphatase: 146 U/L — ABNORMAL HIGH (ref 39–117)
BUN: 11 mg/dL (ref 6–23)
CO2: 35 mEq/L — ABNORMAL HIGH (ref 19–32)
CREATININE: 0.86 mg/dL (ref 0.50–1.10)
Calcium: 9.3 mg/dL (ref 8.4–10.5)
Chloride: 91 mEq/L — ABNORMAL LOW (ref 96–112)
GFR calc Af Amer: 88 mL/min — ABNORMAL LOW (ref >90)
GFR, EST NON AFRICAN AMERICAN: 76 mL/min — AB (ref >90)
Glucose, Bld: 144 mg/dL — ABNORMAL HIGH (ref 70–99)
Potassium: 3.2 mEq/L — ABNORMAL LOW (ref 3.7–5.3)
Sodium: 137 mEq/L (ref 137–147)
TOTAL PROTEIN: 7.8 g/dL (ref 6.0–8.3)
Total Bilirubin: 0.5 mg/dL (ref 0.3–1.2)

## 2014-02-22 LAB — URINALYSIS, ROUTINE W REFLEX MICROSCOPIC
Glucose, UA: NEGATIVE mg/dL
Ketones, ur: NEGATIVE mg/dL
NITRITE: POSITIVE — AB
Protein, ur: NEGATIVE mg/dL
SPECIFIC GRAVITY, URINE: 1.008 (ref 1.005–1.030)
UROBILINOGEN UA: 4 mg/dL — AB (ref 0.0–1.0)
pH: 7 (ref 5.0–8.0)

## 2014-02-22 LAB — URINE MICROSCOPIC-ADD ON

## 2014-02-22 LAB — PROTIME-INR
INR: 1.12 (ref 0.00–1.49)
Prothrombin Time: 14.2 seconds (ref 11.6–15.2)

## 2014-02-22 LAB — LIPASE, BLOOD: Lipase: 13 U/L (ref 11–59)

## 2014-02-22 LAB — I-STAT CG4 LACTIC ACID, ED: Lactic Acid, Venous: 1.17 mmol/L (ref 0.5–2.2)

## 2014-02-22 LAB — AMMONIA: Ammonia: 32 umol/L (ref 11–60)

## 2014-02-22 LAB — POC OCCULT BLOOD, ED: FECAL OCCULT BLD: POSITIVE — AB

## 2014-02-22 LAB — TROPONIN I: Troponin I: <0.3 ng/mL (ref <0.30)

## 2014-02-22 MED ORDER — FLEET ENEMA 7-19 GM/118ML RE ENEM
1.0000 | ENEMA | Freq: Every day | RECTAL | Status: DC | PRN
  Administered 2014-02-22 – 2014-02-26 (×2): 1 via RECTAL
  Filled 2014-02-22 (×6): qty 1

## 2014-02-22 MED ORDER — POTASSIUM CHLORIDE CRYS ER 20 MEQ PO TBCR
40.0000 meq | EXTENDED_RELEASE_TABLET | ORAL | Status: AC
  Administered 2014-02-22 (×2): 40 meq via ORAL
  Filled 2014-02-22 (×2): qty 2

## 2014-02-22 MED ORDER — ONDANSETRON HCL 4 MG PO TABS
4.0000 mg | ORAL_TABLET | Freq: Four times a day (QID) | ORAL | Status: DC | PRN
  Administered 2014-03-10: 4 mg via ORAL
  Filled 2014-02-22 (×2): qty 1

## 2014-02-22 MED ORDER — BENZOCAINE 10 % MT GEL
1.0000 "application " | Freq: Three times a day (TID) | OROMUCOSAL | Status: DC | PRN
  Filled 2014-02-22: qty 9.4

## 2014-02-22 MED ORDER — SODIUM CHLORIDE 0.9 % IV SOLN
250.0000 mL | INTRAVENOUS | Status: DC | PRN
  Administered 2014-02-24: 250 mL via INTRAVENOUS

## 2014-02-22 MED ORDER — VENLAFAXINE HCL ER 150 MG PO CP24
150.0000 mg | ORAL_CAPSULE | Freq: Every day | ORAL | Status: DC
  Administered 2014-02-23 – 2014-02-24 (×2): 150 mg via ORAL
  Filled 2014-02-22 (×3): qty 1

## 2014-02-22 MED ORDER — DEXTROSE 5 % IV SOLN
1.0000 g | Freq: Three times a day (TID) | INTRAVENOUS | Status: DC
  Administered 2014-02-23 – 2014-02-26 (×11): 1 g via INTRAVENOUS
  Filled 2014-02-22 (×11): qty 1

## 2014-02-22 MED ORDER — ONDANSETRON HCL 4 MG/2ML IJ SOLN: 4.0000 mg | Freq: Four times a day (QID) | INTRAMUSCULAR | Status: DC | PRN

## 2014-02-22 MED ORDER — TRAZODONE HCL 100 MG PO TABS
100.0000 mg | ORAL_TABLET | Freq: Every day | ORAL | Status: DC
  Administered 2014-02-22 – 2014-03-10 (×17): 100 mg via ORAL
  Filled 2014-02-22 (×19): qty 1

## 2014-02-22 MED ORDER — LORATADINE 10 MG PO TABS
10.0000 mg | ORAL_TABLET | Freq: Every day | ORAL | Status: DC
  Administered 2014-02-23 – 2014-03-11 (×17): 10 mg via ORAL
  Filled 2014-02-22 (×18): qty 1

## 2014-02-22 MED ORDER — BUPROPION HCL ER (XL) 300 MG PO TB24
300.0000 mg | ORAL_TABLET | Freq: Every day | ORAL | Status: DC
  Administered 2014-02-25 – 2014-03-11 (×15): 300 mg via ORAL
  Filled 2014-02-22 (×18): qty 1

## 2014-02-22 MED ORDER — SODIUM CHLORIDE 0.9 % IJ SOLN
3.0000 mL | Freq: Two times a day (BID) | INTRAMUSCULAR | Status: DC
  Administered 2014-02-23 – 2014-03-09 (×21): 3 mL via INTRAVENOUS

## 2014-02-22 MED ORDER — SUCRALFATE 1 G PO TABS
1.0000 g | ORAL_TABLET | Freq: Three times a day (TID) | ORAL | Status: DC
  Administered 2014-02-22 – 2014-03-11 (×58): 1 g via ORAL
  Filled 2014-02-22 (×75): qty 1

## 2014-02-22 MED ORDER — OXYCODONE HCL 5 MG PO TABS
10.0000 mg | ORAL_TABLET | Freq: Three times a day (TID) | ORAL | Status: DC | PRN
Start: 2014-02-22 — End: 2014-02-22
  Administered 2014-02-22: 10 mg via ORAL
  Filled 2014-02-22: qty 2

## 2014-02-22 MED ORDER — TIZANIDINE HCL 4 MG PO TABS
4.0000 mg | ORAL_TABLET | Freq: Every evening | ORAL | Status: DC
  Administered 2014-02-22 – 2014-03-10 (×16): 4 mg via ORAL
  Filled 2014-02-22 (×20): qty 1

## 2014-02-22 MED ORDER — DOXYCYCLINE HYCLATE 100 MG PO TABS
100.0000 mg | ORAL_TABLET | Freq: Two times a day (BID) | ORAL | Status: DC
  Administered 2014-02-22 – 2014-02-26 (×8): 100 mg via ORAL
  Filled 2014-02-22 (×9): qty 1

## 2014-02-22 MED ORDER — INSULIN ASPART 100 UNIT/ML ~~LOC~~ SOLN
0.0000 [IU] | Freq: Three times a day (TID) | SUBCUTANEOUS | Status: DC
  Administered 2014-02-23: 3 [IU] via SUBCUTANEOUS
  Administered 2014-02-23: 2 [IU] via SUBCUTANEOUS
  Administered 2014-02-23: 3 [IU] via SUBCUTANEOUS
  Administered 2014-02-24: 1 [IU] via SUBCUTANEOUS
  Administered 2014-02-24: 2 [IU] via SUBCUTANEOUS
  Administered 2014-02-25: 3 [IU] via SUBCUTANEOUS
  Administered 2014-02-26 (×2): 1 [IU] via SUBCUTANEOUS
  Administered 2014-02-27: 3 [IU] via SUBCUTANEOUS
  Administered 2014-02-27: 2 [IU] via SUBCUTANEOUS
  Administered 2014-02-28: 1 [IU] via SUBCUTANEOUS
  Administered 2014-03-01: 3 [IU] via SUBCUTANEOUS
  Administered 2014-03-01: 1 [IU] via SUBCUTANEOUS
  Administered 2014-03-02: 3 [IU] via SUBCUTANEOUS
  Administered 2014-03-03: 1 [IU] via SUBCUTANEOUS
  Administered 2014-03-03: 3 [IU] via SUBCUTANEOUS
  Administered 2014-03-03 – 2014-03-07 (×2): 1 [IU] via SUBCUTANEOUS
  Administered 2014-03-07 – 2014-03-08 (×2): 2 [IU] via SUBCUTANEOUS
  Administered 2014-03-09 – 2014-03-10 (×3): 1 [IU] via SUBCUTANEOUS

## 2014-02-22 MED ORDER — PHENAZOPYRIDINE HCL 200 MG PO TABS
200.0000 mg | ORAL_TABLET | Freq: Three times a day (TID) | ORAL | Status: DC | PRN
  Administered 2014-02-22 – 2014-03-11 (×18): 200 mg via ORAL
  Filled 2014-02-22 (×18): qty 1

## 2014-02-22 MED ORDER — POLYVINYL ALCOHOL-POVIDONE 2.7-2 % OP SOLN: 1.0000 [drp] | Freq: Two times a day (BID) | OPHTHALMIC | Status: DC

## 2014-02-22 MED ORDER — OXYCODONE HCL 5 MG PO TABS
10.0000 mg | ORAL_TABLET | ORAL | Status: DC | PRN
  Administered 2014-02-22 – 2014-03-11 (×46): 10 mg via ORAL
  Filled 2014-02-22 (×46): qty 2

## 2014-02-22 MED ORDER — MAGNESIUM HYDROXIDE 400 MG/5ML PO SUSP
30.0000 mL | Freq: Every day | ORAL | Status: DC | PRN
  Administered 2014-03-04: 30 mL via ORAL
  Filled 2014-02-22: qty 30

## 2014-02-22 MED ORDER — HYDROXYZINE HCL 25 MG PO TABS
25.0000 mg | ORAL_TABLET | Freq: Three times a day (TID) | ORAL | Status: DC | PRN
  Administered 2014-02-25: 25 mg via ORAL
  Filled 2014-02-22: qty 1

## 2014-02-22 MED ORDER — POLYETHYLENE GLYCOL 3350 17 G PO PACK
17.0000 g | PACK | Freq: Every day | ORAL | Status: DC | PRN
  Filled 2014-02-22: qty 1

## 2014-02-22 MED ORDER — DEXTROSE 5 % IV SOLN
500.0000 mg | Freq: Once | INTRAVENOUS | Status: DC
  Filled 2014-02-22: qty 0.5

## 2014-02-22 MED ORDER — IPRATROPIUM-ALBUTEROL 0.5-2.5 (3) MG/3ML IN SOLN: 3.0000 mL | RESPIRATORY_TRACT | Status: DC | PRN

## 2014-02-22 MED ORDER — DEXTROSE 5 % IV SOLN
1.0000 g | Freq: Once | INTRAVENOUS | Status: AC
  Administered 2014-02-22: 1 g via INTRAVENOUS
  Filled 2014-02-22: qty 1

## 2014-02-22 MED ORDER — BISACODYL 5 MG PO TBEC
10.0000 mg | DELAYED_RELEASE_TABLET | Freq: Every day | ORAL | Status: DC | PRN
  Filled 2014-02-22 (×2): qty 2

## 2014-02-22 MED ORDER — DIAZEPAM 5 MG PO TABS
10.0000 mg | ORAL_TABLET | Freq: Every evening | ORAL | Status: DC | PRN
  Administered 2014-02-22: 5 mg via ORAL
  Administered 2014-02-25 – 2014-03-09 (×5): 10 mg via ORAL
  Filled 2014-02-22 (×9): qty 2

## 2014-02-22 MED ORDER — CEFTAZIDIME 1 G IJ SOLR
1.0000 g | Freq: Once | INTRAMUSCULAR | Status: DC
  Filled 2014-02-22: qty 1

## 2014-02-22 MED ORDER — INSULIN ASPART PROT & ASPART (70-30 MIX) 100 UNIT/ML ~~LOC~~ SUSP
34.0000 [IU] | Freq: Two times a day (BID) | SUBCUTANEOUS | Status: DC
  Administered 2014-02-23 – 2014-03-04 (×18): 34 [IU] via SUBCUTANEOUS
  Filled 2014-02-22 (×2): qty 10

## 2014-02-22 MED ORDER — ACETAMINOPHEN 325 MG PO TABS
650.0000 mg | ORAL_TABLET | ORAL | Status: DC | PRN
  Administered 2014-02-23 – 2014-03-11 (×34): 650 mg via ORAL
  Filled 2014-02-22 (×34): qty 2

## 2014-02-22 MED ORDER — METHADONE HCL 5 MG PO TABS
10.0000 mg | ORAL_TABLET | Freq: Three times a day (TID) | ORAL | Status: DC
  Administered 2014-02-22: 10 mg via ORAL
  Filled 2014-02-22 (×2): qty 2

## 2014-02-22 MED ORDER — OXYBUTYNIN CHLORIDE 5 MG PO TABS
5.0000 mg | ORAL_TABLET | Freq: Three times a day (TID) | ORAL | Status: DC
  Administered 2014-02-22 – 2014-03-11 (×47): 5 mg via ORAL
  Filled 2014-02-22 (×55): qty 1

## 2014-02-22 MED ORDER — SODIUM CHLORIDE 0.9 % IJ SOLN: 3.0000 mL | INTRAMUSCULAR | Status: DC | PRN

## 2014-02-22 MED ORDER — FLUTICASONE PROPIONATE 50 MCG/ACT NA SUSP
1.0000 | Freq: Every day | NASAL | Status: DC
  Administered 2014-02-23 – 2014-03-09 (×5): 1 via NASAL
  Filled 2014-02-22 (×3): qty 16

## 2014-02-22 MED ORDER — LEVOTHYROXINE SODIUM 150 MCG PO TABS
300.0000 ug | ORAL_TABLET | Freq: Every day | ORAL | Status: DC
  Administered 2014-02-23 – 2014-03-11 (×16): 300 ug via ORAL
  Filled 2014-02-22 (×19): qty 2

## 2014-02-22 MED ORDER — LUBIPROSTONE 24 MCG PO CAPS
24.0000 ug | ORAL_CAPSULE | Freq: Two times a day (BID) | ORAL | Status: DC
  Administered 2014-02-23 – 2014-03-09 (×6): 24 ug via ORAL
  Filled 2014-02-22 (×38): qty 1

## 2014-02-22 MED ORDER — GABAPENTIN 300 MG PO CAPS
300.0000 mg | ORAL_CAPSULE | Freq: Two times a day (BID) | ORAL | Status: DC
  Administered 2014-02-22 – 2014-03-11 (×34): 300 mg via ORAL
  Filled 2014-02-22 (×36): qty 1

## 2014-02-22 MED ORDER — POLYVINYL ALCOHOL 1.4 % OP SOLN
1.0000 [drp] | Freq: Two times a day (BID) | OPHTHALMIC | Status: DC
  Administered 2014-02-22 – 2014-03-11 (×34): 1 [drp] via OPHTHALMIC
  Filled 2014-02-22 (×2): qty 15

## 2014-02-22 MED ORDER — SODIUM CHLORIDE 0.9 % IV SOLN
INTRAVENOUS | Status: DC
  Administered 2014-02-22 – 2014-02-23 (×2): via INTRAVENOUS

## 2014-02-22 NOTE — H&P (Addendum)
PCP:   No primary provider on file.   Chief Complaint:  Fever, weakness  HPI: 53 year old female with multiple medical problems who is currently residing at skilled nursing facility was brought to the hospital with fever and generalized weakness. Patient has previous history of Lyme disease, chronic pain syndrome, chronic UTIs, patient is bedbound says she was diagnosed with Lyme disease in 1993. She also has history of fibromyalgia and has been on methadone 10 mg 3 times a day. Patient has suprapubic catheter in place. Patient was recently diagnosed with Pseudomonas UTI and was started on Fortaz in the nursing home by Dr. Dellia Nims. She also has history of anemia, patient did not want her transfusions at the nursing home, and also has refused iron in the past. Patient is very depressed at this time, and is very unhappy with the care provided at the nursing home. She also has been complaint of lesion on the scalp which appears more self-inflicted as patient says that the skin tags are hanging over her forehead. She also has been complaining of abdominal pain especially around the area of bladder.  In  the ED, patient was found to have abnormal UA as well as positive stool for occult blood. She also found to have hemoglobin of 7.6, she received one dose of Fortaz in the ED. CT scan of the abdomen was done, which showed abundant colonic stool and no other significant abnormality.   Allergies:   Allergies  Allergen Reactions  . Ibuprofen   . Levaquin [Levofloxacin In D5w]   . Penicillins   . Sulfa Antibiotics       Past Medical History  Diagnosis Date  . Diabetes mellitus without complication   . Chronic pain   . Morbid obesity   . OSA (obstructive sleep apnea)   . Generalized weakness   . Fibromyalgia   . Lyme disease   . Anemia   . History of blood transfusion     pt states she has had 5 blood transfusions  . Sleep disorder     Past Surgical History  Procedure Laterality Date  .  Explaratory      on stomach,   . Appendectomy    . Foot surgery      due to stepping on broken glass  . Spinal tap    . Supra pubic catheter      Prior to Admission medications   Medication Sig Start Date End Date Taking? Authorizing Provider  acetaminophen (TYLENOL) 325 MG tablet Take 650 mg by mouth every 4 (four) hours as needed for mild pain.   Yes Historical Provider, MD  benzocaine (ORAJEL) 10 % mucosal gel Use as directed 1 application in the mouth or throat 3 (three) times daily as needed for mouth pain.   Yes Historical Provider, MD  bisacodyl (DULCOLAX) 5 MG EC tablet Take 10 mg by mouth daily as needed for moderate constipation.   Yes Historical Provider, MD  buPROPion (WELLBUTRIN XL) 300 MG 24 hr tablet Take 300 mg by mouth daily.   Yes Historical Provider, MD  cefTAZidime (FORTAZ) 1 G injection Inject 1 g into the muscle every 12 (twelve) hours. At 9am, 9pm   Yes Historical Provider, MD  cloNIDine (CATAPRES) 0.1 MG tablet Take 0.1 mg by mouth as needed.   Yes Historical Provider, MD  diazepam (VALIUM) 10 MG tablet Take 10 mg by mouth at bedtime as needed for anxiety.   Yes Historical Provider, MD  diazepam (VALIUM) 2 MG tablet Take 2  mg by mouth 2 (two) times daily as needed for anxiety (and personal care).   Yes Historical Provider, MD  doxycycline (VIBRA-TABS) 100 MG tablet Take 100 mg by mouth every 12 (twelve) hours. At noon and midnight   Yes Historical Provider, MD  fluticasone (FLONASE) 50 MCG/ACT nasal spray Place 1 spray into both nostrils daily.   Yes Historical Provider, MD  gabapentin (NEURONTIN) 300 MG capsule Take 300 mg by mouth 2 (two) times daily.   Yes Historical Provider, MD  guaifenesin (ROBITUSSIN) 100 MG/5ML syrup Take 200 mg by mouth every 4 (four) hours as needed for cough.   Yes Historical Provider, MD  hydrochlorothiazide (HYDRODIURIL) 25 MG tablet Take 25 mg by mouth daily.   Yes Historical Provider, MD  hydrOXYzine (ATARAX/VISTARIL) 25 MG tablet Take  25 mg by mouth every 8 (eight) hours as needed for itching.   Yes Historical Provider, MD  insulin lispro (HUMALOG) 100 UNIT/ML injection Inject 0-8 Units into the skin See admin instructions. Sliding Scale - Inject before each meal and at bed time   Yes Historical Provider, MD  insulin NPH-regular Human (NOVOLIN 70/30) (70-30) 100 UNIT/ML injection Inject 34 Units into the skin 2 (two) times daily with a meal.    Yes Historical Provider, MD  ipratropium-albuterol (DUONEB) 0.5-2.5 (3) MG/3ML SOLN Take 3 mLs by nebulization every 4 (four) hours as needed (for wheezing).   Yes Historical Provider, MD  levothyroxine (SYNTHROID, LEVOTHROID) 300 MCG tablet Take 300 mcg by mouth daily before breakfast.   Yes Historical Provider, MD  loperamide (IMODIUM) 1 MG/5ML solution Take 4 mg by mouth as needed for diarrhea or loose stools.   Yes Historical Provider, MD  loratadine (CLARITIN) 10 MG tablet Take 10 mg by mouth daily.   Yes Historical Provider, MD  lubiprostone (AMITIZA) 24 MCG capsule Take 24 mcg by mouth 2 (two) times daily with a meal.   Yes Historical Provider, MD  magnesium hydroxide (MILK OF MAGNESIA) 400 MG/5ML suspension Take 30 mLs by mouth daily as needed for mild constipation.   Yes Historical Provider, MD  methadone (DOLOPHINE) 10 MG tablet Take three tablets by mouth every eight hours for pain 02/13/14  Yes Estill Dooms, MD  nitroGLYCERIN (NITROSTAT) 0.4 MG SL tablet Place 0.4 mg under the tongue every 5 (five) minutes as needed for chest pain.   Yes Historical Provider, MD  omeprazole (PRILOSEC) 20 MG capsule Take 20 mg by mouth daily.   Yes Historical Provider, MD  oxybutynin (DITROPAN) 5 MG tablet Take 5 mg by mouth 3 (three) times daily.   Yes Historical Provider, MD  Oxycodone HCl 10 MG TABS Take one tablet by mouth every 4 hours as needed 02/07/14  Yes Tiffany L Reed, DO  phenazopyridine (PYRIDIUM) 200 MG tablet Take 200 mg by mouth 3 (three) times daily as needed for pain.   Yes  Historical Provider, MD  phenol-menthol (CEPASTAT) 14.5 MG lozenge Place 1 lozenge inside cheek as needed for sore throat.   Yes Historical Provider, MD  Polyethyl Glycol-Propyl Glycol (SYSTANE) 0.4-0.3 % SOLN Place 2 drops into both eyes every 6 (six) hours as needed.   Yes Historical Provider, MD  polyethylene glycol (MIRALAX / GLYCOLAX) packet Take 17 g by mouth daily as needed (constipation).    Yes Historical Provider, MD  Polyvinyl Alcohol-Povidone (FRESHKOTE) 2.7-2 % SOLN Place 1 drop into both eyes 2 (two) times daily.   Yes Historical Provider, MD  potassium chloride (MICRO-K) 10 MEQ CR capsule Take 10  mEq by mouth daily.   Yes Historical Provider, MD  sodium phosphate (FLEET) enema Place 1 enema rectally daily as needed. follow package directions   Yes Historical Provider, MD  sucralfate (CARAFATE) 1 G tablet Take 1 g by mouth 4 (four) times daily -  with meals and at bedtime.   Yes Historical Provider, MD  tizanidine (ZANAFLEX) 2 MG capsule Take 2 mg by mouth daily.    Yes Historical Provider, MD  tiZANidine (ZANAFLEX) 4 MG capsule Take 4 mg by mouth every evening.    Yes Historical Provider, MD  traZODone (DESYREL) 100 MG tablet Take 100 mg by mouth at bedtime.   Yes Historical Provider, MD  venlafaxine XR (EFFEXOR-XR) 150 MG 24 hr capsule Take 150 mg by mouth daily with breakfast.   Yes Historical Provider, MD  Vitamin D, Ergocalciferol, (DRISDOL) 50000 UNITS CAPS capsule Take 50,000 Units by mouth every 14 (fourteen) days.   Yes Historical Provider, MD    Social History:  reports that she has quit smoking. She does not have any smokeless tobacco history on file. She reports that she does not drink alcohol or use illicit drugs.  History reviewed. No pertinent family history.   All the positives are listed in BOLD  Review of Systems:  HEENT: Headache, blurred vision, runny nose, sore throat, history of pseudotumor cerebri Neck: Hypothyroidism,  hyperthyroidism,,lymphadenopathy Chest : Shortness of breath, history of COPD, Asthma Heart : Chest pain, history of coronary arterey disease GI:  Nausea, vomiting, diarrhea, constipation, GERD, GU: Dysuria, urgency, frequency of urination, hematuria Neuro: Stroke, seizures, syncope Psych: Depression, anxiety, hallucinations   Physical Exam: Blood pressure 124/72, pulse 103, temperature 99.9 F (37.7 C), temperature source Oral, SpO2 97.00%. Constitutional:   Patient is a morbidly obese female in no acute distress and cooperative with exam. Head: Normocephalic and atraumatic Mouth: Mucus membranes moist Eyes: PERRL, EOMI, conjunctivae normal Neck: Supple, No Thyromegaly Cardiovascular: RRR, S1 normal, S2 normal Pulmonary/Chest: CTAB, no wheezes, rales, or rhonchi Abdominal: Soft. Non-tender, distended, suprapubic catheter in place, bowel sounds are normal, no masses, organomegaly, or guarding present.  Neurological: A&O x3, Strenght is normal and symmetric bilaterally, cranial nerve II-XII are grossly intact, no focal motor deficit, sensory intact to light touch bilaterally.  Extremities : No Cyanosis, Clubbing or Edema Skin: Patient has open wound on the scalp.   Labs on Admission:  Results for orders placed during the hospital encounter of 02/22/14 (from the past 48 hour(s))  URINALYSIS, ROUTINE W REFLEX MICROSCOPIC     Status: Abnormal   Collection Time    02/22/14  1:47 PM      Result Value Ref Range   Color, Urine ORANGE (*) YELLOW   Comment: BIOCHEMICALS MAY BE AFFECTED BY COLOR   APPearance CLOUDY (*) CLEAR   Specific Gravity, Urine 1.008  1.005 - 1.030   pH 7.0  5.0 - 8.0   Glucose, UA NEGATIVE  NEGATIVE mg/dL   Hgb urine dipstick TRACE (*) NEGATIVE   Bilirubin Urine SMALL (*) NEGATIVE   Ketones, ur NEGATIVE  NEGATIVE mg/dL   Protein, ur NEGATIVE  NEGATIVE mg/dL   Urobilinogen, UA 4.0 (*) 0.0 - 1.0 mg/dL   Nitrite POSITIVE (*) NEGATIVE   Leukocytes, UA MODERATE (*)  NEGATIVE  URINE MICROSCOPIC-ADD ON     Status: Abnormal   Collection Time    02/22/14  1:47 PM      Result Value Ref Range   Squamous Epithelial / LPF RARE  RARE   WBC, UA  3-6  <3 WBC/hpf   RBC / HPF 0-2  <3 RBC/hpf   Bacteria, UA MANY (*) RARE  CBC WITH DIFFERENTIAL     Status: Abnormal   Collection Time    02/22/14  1:50 PM      Result Value Ref Range   WBC 8.6  4.0 - 10.5 K/uL   RBC 3.64 (*) 3.87 - 5.11 MIL/uL   Hemoglobin 7.6 (*) 12.0 - 15.0 g/dL   Comment: REPEATED TO VERIFY   HCT 27.6 (*) 36.0 - 46.0 %   MCV 75.8 (*) 78.0 - 100.0 fL   MCH 20.9 (*) 26.0 - 34.0 pg   MCHC 27.5 (*) 30.0 - 36.0 g/dL   RDW 16.9 (*) 11.5 - 15.5 %   Platelets 281  150 - 400 K/uL   Neutrophils Relative % 74  43 - 77 %   Lymphocytes Relative 17  12 - 46 %   Monocytes Relative 8  3 - 12 %   Eosinophils Relative 1  0 - 5 %   Basophils Relative 0  0 - 1 %   Neutro Abs 6.3  1.7 - 7.7 K/uL   Lymphs Abs 1.5  0.7 - 4.0 K/uL   Monocytes Absolute 0.7  0.1 - 1.0 K/uL   Eosinophils Absolute 0.1  0.0 - 0.7 K/uL   Basophils Absolute 0.0  0.0 - 0.1 K/uL   RBC Morphology STOMATOCYTES     Comment: POLYCHROMASIA PRESENT   WBC Morphology VACUOLATED NEUTROPHILS     Comment: WHITE COUNT CONFIRMED ON SMEAR  COMPREHENSIVE METABOLIC PANEL     Status: Abnormal   Collection Time    02/22/14  1:50 PM      Result Value Ref Range   Sodium 137  137 - 147 mEq/L   Potassium 3.2 (*) 3.7 - 5.3 mEq/L   Chloride 91 (*) 96 - 112 mEq/L   CO2 35 (*) 19 - 32 mEq/L   Glucose, Bld 144 (*) 70 - 99 mg/dL   BUN 11  6 - 23 mg/dL   Creatinine, Ser 0.86  0.50 - 1.10 mg/dL   Calcium 9.3  8.4 - 10.5 mg/dL   Total Protein 7.8  6.0 - 8.3 g/dL   Albumin 3.3 (*) 3.5 - 5.2 g/dL   AST 61 (*) 0 - 37 U/L   ALT 62 (*) 0 - 35 U/L   Alkaline Phosphatase 146 (*) 39 - 117 U/L   Total Bilirubin 0.5  0.3 - 1.2 mg/dL   GFR calc non Af Amer 76 (*) >90 mL/min   GFR calc Af Amer 88 (*) >90 mL/min   Comment: (NOTE)     The eGFR has been calculated  using the CKD EPI equation.     This calculation has not been validated in all clinical situations.     eGFR's persistently <90 mL/min signify possible Chronic Kidney     Disease.  PROTIME-INR     Status: None   Collection Time    02/22/14  1:50 PM      Result Value Ref Range   Prothrombin Time 14.2  11.6 - 15.2 seconds   INR 1.12  0.00 - 1.49  TROPONIN I     Status: None   Collection Time    02/22/14  1:50 PM      Result Value Ref Range   Troponin I <0.30  <0.30 ng/mL   Comment:            Due to the release kinetics  of cTnI,     a negative result within the first hours     of the onset of symptoms does not rule out     myocardial infarction with certainty.     If myocardial infarction is still suspected,     repeat the test at appropriate intervals.  LIPASE, BLOOD     Status: None   Collection Time    02/22/14  1:50 PM      Result Value Ref Range   Lipase 13  11 - 59 U/L  AMMONIA     Status: None   Collection Time    02/22/14  1:56 PM      Result Value Ref Range   Ammonia 32  11 - 60 umol/L  POC OCCULT BLOOD, ED     Status: Abnormal   Collection Time    02/22/14  2:09 PM      Result Value Ref Range   Fecal Occult Bld POSITIVE (*) NEGATIVE  I-STAT CG4 LACTIC ACID, ED     Status: None   Collection Time    02/22/14  2:11 PM      Result Value Ref Range   Lactic Acid, Venous 1.17  0.5 - 2.2 mmol/L    Radiological Exams on Admission: Ct Abdomen Pelvis Wo Contrast  02/22/2014   CLINICAL DATA:  rectal bleeding, weakness, fibromyalgia , morbid obesity  EXAM: CT ABDOMEN AND PELVIS WITHOUT CONTRAST  TECHNIQUE: Multidetector CT imaging of the abdomen and pelvis was performed following the standard protocol without intravenous contrast.  COMPARISON:  None.  FINDINGS: Sagittal images of the spine are unremarkable. There are streaky artifacts from patient's large body habitus.  Lung bases are unremarkable.  Unenhanced liver shows no biliary ductal dilatation. Markedly distended  gallbladder without evidence of calcified gallstones. No pericholecystic fluid is suggested. Partially fatty replaced pancreas. The spleen and adrenal glands are unremarkable.  Kidneys are symmetrical in size. No nephrolithiasis. No hydronephrosis or hydroureter. No aortic aneurysm.  Abundant colonic stool.  No pericecal inflammation.  Abundant stool noted within distal sigmoid colon and rectum. The rectum measures at least 8 cm in diameter suspicious for fecal impaction. The uterus has a nodular appearance probable due to fibroids. The largest fibroid best visualized in sagittal image 73 measures about 4.4 cm. Correlation with pelvic ultrasound is recommended.  There is a suprapubic bladder catheter.  There is asymmetric thickening of left in left anal wall. Correlation with clinical exam is recommended to exclude inflammation or neoplastic process. Best seen in axial image 77.  No small bowel obstruction.  No adenopathy.  No ascites or free air.  IMPRESSION: Main impression:  Abundant colonic stool. There is distension of the rectum with stool measures at least 8 cm in diameter suspicious for fecal impaction.  There is asymmetric thickening of left in left anal wall. Correlation with clinical exam is recommended to exclude inflammation or neoplastic process. Best seen in axial image 77.  1. Probable fibroid uterus. 2. Suprapubic bladder catheter. 3. Distended gallbladder without calcified gallstones. 4. No hydronephrosis or hydroureter. 5. No small bowel obstruction.   Electronically Signed   By: Natasha Mead M.D.   On: 02/22/2014 14:41   Dg Chest 2 View  02/22/2014   CLINICAL DATA:  Weakness, body pain  EXAM: CHEST  2 VIEW  COMPARISON:  None.  FINDINGS: Mediastinal contour is normal. The heart size is probably enlarged. The aorta is tortuous. Both lungs are clear. The visualized skeletal structures are unremarkable.  IMPRESSION:  No active cardiopulmonary disease.   Electronically Signed   By: Abelardo Diesel M.D.    On: 02/22/2014 14:32    Assessment/Plan Principal Problem:   Urinary tract infection Active Problems:   Chronic pain syndrome   HTN (hypertension)   Type II or unspecified type diabetes mellitus with peripheral circulatory disorders, uncontrolled(250.72)   Depression   Anemia, iron deficiency   UTI (lower urinary tract infection)  UTI Patient has history of chronic UTI and has a suprapubic catheter in place. Will start the patient on IV Fortaz. We'll follow the urine culture results.  Anemia Patient has history of chronic anemia, she also found to have altered stool for occult blood. Patient has refused GI workup in the past, and is not sure whether she would like to get endoscopy or colonoscopy. Patient would like to first discuss with palliative care, before deciding on aggressive measures. We'll continue to monitor the H&H every 8 hours and will transfuse as needed for hemoglobin less than 7. Patient also has refused blood transfusion the past.  Depression  Patient is severely depressed, has been crying throughout the interview. She also has lot of anger against her partner, and also is very unhappy with the care provided at the nursing facility. We'll call psychiatric consult  to adjust the patient's medications for depression. Social worker has already been consulted. We'll continue patient's home regimen for depression including Effexor, Wellbutrin  Chronic pain syndrome Patient has history of Lyme disease, fibromyalgia and has been on methadone 10 mg by mouth 3 times a day along with oxycodone when necessary for pain. We'll continue the same medications.  Diabetes mellitus Patient wants to eat regular food, as she feels that she doesn't have much quality of life. We'll continue the Novolin 70/30 along with sliding scale insulin.  Constipation Patient says that she had last BM last night after she was given an enema, will continue the MiraLAX and enemas when  necessary.  Abdominal pain Patient has history of chronic UTI with suprapubic catheter in place. CT bone pelvis negative for acute pathology. Likely combination of constipation and chronic UTI. We'll continue with the methadone and when necessary oxycodone.  Scalp wound Most likely self-inflicted, as patient complains of skin tags hanging around for head. And she admits to scratching on the wound. We'll continue with the hydroxyzine when necessary for itching and also obtain a wound care consult.   DVT prophylaxis SCDs  Goals of care- discussed with patient in detail, patient is severely depressed and unhappy with her current situation, she is not very willing to go with aggressive measures, and constantly talking about quality of life issues, she agrees to get palliative care consult to discuss goals of care.      Code status: DO NOT RESUSCITATE  Family discussion: No family at bedside, patient's both parents have dementia. She does not have any other family member or friend.   Time Spent on Admission: 80 minutes  [facility] Pager: [phone] 02/22/2014, 4:37 PM  If 7PM-7AM, please contact night-coverage  [email or web address]  Password [email or web address]

## 2014-02-22 NOTE — Progress Notes (Signed)
Utilization Review completed.  Merle Whitehorn RN CM  

## 2014-02-22 NOTE — ED Provider Notes (Signed)
CSN: [id number]     Arrival date & time 02/22/14  1312 History   First MD Initiated Contact with Patient 02/22/14 1319     Chief Complaint  Patient presents with  . Rectal Bleeding  . Weakness     (Consider location/radiation/quality/duration/timing/severity/associated sxs/prior Treatment) HPI Comments: Patient from nursing home with multiple complaints. She complains of generalized weakness for several weeks, increased stressors and doesn't like her living situation. She does not walk secondary to previous history of Lyme disease and chronic arthritis. She has a suprapubic catheter. She has chronic UTIs and was told that she had a Pseudomonas infection but she couldn't tolerate the antibiotic she was given. She endorses increasing shortness of breath, abdominal girth and distention with abdominal pain and diffuse body aches. She reports a fever to 102. She endorses feeling constipated and seeing some blood in her last bowel movement. She feels like her breathing is worse than baseline. She is worried about a scalp lesion that she's had for several months. She believes is related to everything else going on.  The history is provided by the patient.    Past Medical History  Diagnosis Date  . Diabetes mellitus without complication   . Chronic pain   . Morbid obesity   . OSA (obstructive sleep apnea)   . Generalized weakness   . Fibromyalgia   . Lyme disease   . Anemia   . History of blood transfusion     pt states she has had 5 blood transfusions  . Sleep disorder    Past Surgical History  Procedure Laterality Date  . Explaratory      on stomach,   . Appendectomy    . Foot surgery      due to stepping on broken glass  . Spinal tap    . Supra pubic catheter     History reviewed. No pertinent family history. History  Substance Use Topics  . Smoking status: Former Games developer  . Smokeless tobacco: Not on file  . Alcohol Use: No   OB History   Grav Para Term Preterm Abortions TAB  SAB Ect Mult Living                 Review of Systems  Constitutional: Positive for fever, activity change, appetite change and fatigue.  Eyes: Negative for visual disturbance.  Respiratory: Positive for cough and shortness of breath. Negative for chest tightness.   Cardiovascular: Negative for chest pain.  Gastrointestinal: Positive for nausea, vomiting, abdominal pain and hematochezia.  Genitourinary: Negative for dysuria, hematuria, vaginal bleeding and vaginal discharge.  Musculoskeletal: Positive for arthralgias, back pain and myalgias.  Neurological: Positive for weakness. Negative for light-headedness.  A complete 10 system review of systems was obtained and all systems are negative except as noted in the HPI and PMH.      Allergies  Ibuprofen; Levaquin; Penicillins; and Sulfa antibiotics  Home Medications   No current outpatient prescriptions on file. BP 114/56  Pulse 95  Temp(Src) 99.3 F (37.4 C) (Oral)  Resp 16  Ht 5\' 5"  (1.651 m)  Wt 282 lb (127.914 kg)  BMI 46.93 kg/m2  SpO2 100% Physical Exam  Constitutional: She is oriented to person, place, and time. She appears well-developed and well-nourished. No distress.  HENT:  Head: Normocephalic and atraumatic.  Mouth/Throat: Oropharynx is clear and moist. No oropharyngeal exudate.  Eyes: Conjunctivae and EOM are normal. Pupils are equal, round, and reactive to light.  Neck: Normal range of motion. Neck supple.  Cardiovascular: Normal rate, regular rhythm and normal heart sounds.   Pulmonary/Chest: Effort normal and breath sounds normal. No respiratory distress.  Abdominal: Soft. She exhibits distension. There is tenderness.  Distended abdomen with reducible ventral hernia. Suprapubic catheter in place  Genitourinary: Guaiac positive stool.  Chaperon present, no fecal impaction  Musculoskeletal: Normal range of motion. She exhibits no edema and no tenderness.  No CVAT  Neurological: She is alert and oriented to  person, place, and time. No cranial nerve deficit. Coordination normal.  Skin: Skin is warm.  Erythematous lesion to frontal scalp with excoriation.    ED Course  Procedures (including critical care time) Labs Review Labs Reviewed  CBC WITH DIFFERENTIAL - Abnormal; Notable for the following:    RBC 3.64 (*)    Hemoglobin 7.6 (*)    HCT 27.6 (*)    MCV 75.8 (*)    MCH 20.9 (*)    MCHC 27.5 (*)    RDW 16.9 (*)    All other components within normal limits  COMPREHENSIVE METABOLIC PANEL - Abnormal; Notable for the following:    Potassium 3.2 (*)    Chloride 91 (*)    CO2 35 (*)    Glucose, Bld 144 (*)    Albumin 3.3 (*)    AST 61 (*)    ALT 62 (*)    Alkaline Phosphatase 146 (*)    GFR calc non Af Amer 76 (*)    GFR calc Af Amer 88 (*)    All other components within normal limits  URINALYSIS, ROUTINE W REFLEX MICROSCOPIC - Abnormal; Notable for the following:    Color, Urine ORANGE (*)    APPearance CLOUDY (*)    Hgb urine dipstick TRACE (*)    Bilirubin Urine SMALL (*)    Urobilinogen, UA 4.0 (*)    Nitrite POSITIVE (*)    Leukocytes, UA MODERATE (*)    All other components within normal limits  URINE MICROSCOPIC-ADD ON - Abnormal; Notable for the following:    Bacteria, UA MANY (*)    All other components within normal limits  HEMOGLOBIN AND HEMATOCRIT, BLOOD - Abnormal; Notable for the following:    Hemoglobin 7.1 (*)    HCT 26.4 (*)    All other components within normal limits  POC OCCULT BLOOD, ED - Abnormal; Notable for the following:    Fecal Occult Bld POSITIVE (*)    All other components within normal limits  CULTURE, BLOOD (ROUTINE X 2)  CULTURE, BLOOD (ROUTINE X 2)  URINE CULTURE  PROTIME-INR  TROPONIN I  LIPASE, BLOOD  AMMONIA  CBC  COMPREHENSIVE METABOLIC PANEL  HEMOGLOBIN AND HEMATOCRIT, BLOOD  I-STAT CG4 LACTIC ACID, ED   Imaging Review Ct Abdomen Pelvis Wo Contrast  02/22/2014   CLINICAL DATA:  rectal bleeding, weakness, fibromyalgia , morbid  obesity  EXAM: CT ABDOMEN AND PELVIS WITHOUT CONTRAST  TECHNIQUE: Multidetector CT imaging of the abdomen and pelvis was performed following the standard protocol without intravenous contrast.  COMPARISON:  None.  FINDINGS: Sagittal images of the spine are unremarkable. There are streaky artifacts from patient's large body habitus.  Lung bases are unremarkable.  Unenhanced liver shows no biliary ductal dilatation. Markedly distended gallbladder without evidence of calcified gallstones. No pericholecystic fluid is suggested. Partially fatty replaced pancreas. The spleen and adrenal glands are unremarkable.  Kidneys are symmetrical in size. No nephrolithiasis. No hydronephrosis or hydroureter. No aortic aneurysm.  Abundant colonic stool.  No pericecal inflammation.  Abundant stool noted within distal  sigmoid colon and rectum. The rectum measures at least 8 cm in diameter suspicious for fecal impaction. The uterus has a nodular appearance probable due to fibroids. The largest fibroid best visualized in sagittal image 73 measures about 4.4 cm. Correlation with pelvic ultrasound is recommended.  There is a suprapubic bladder catheter.  There is asymmetric thickening of left in left anal wall. Correlation with clinical exam is recommended to exclude inflammation or neoplastic process. Best seen in axial image 77.  No small bowel obstruction.  No adenopathy.  No ascites or free air.  IMPRESSION: Main impression:  Abundant colonic stool. There is distension of the rectum with stool measures at least 8 cm in diameter suspicious for fecal impaction.  There is asymmetric thickening of left in left anal wall. Correlation with clinical exam is recommended to exclude inflammation or neoplastic process. Best seen in axial image 77.  1. Probable fibroid uterus. 2. Suprapubic bladder catheter. 3. Distended gallbladder without calcified gallstones. 4. No hydronephrosis or hydroureter. 5. No small bowel obstruction.   Electronically  Signed   By: Natasha MeadLiviu  Pop M.D.   On: 02/22/2014 14:41   Dg Chest 2 View  02/22/2014   CLINICAL DATA:  Weakness, body pain  EXAM: CHEST  2 VIEW  COMPARISON:  None.  FINDINGS: Mediastinal contour is normal. The heart size is probably enlarged. The aorta is tortuous. Both lungs are clear. The visualized skeletal structures are unremarkable.  IMPRESSION: No active cardiopulmonary disease.   Electronically Signed   By: Sherian ReinWei-Chen  Lin M.D.   On: 02/22/2014 14:32     EKG Interpretation   Date/Time:  Friday February 22 2014 13:42:19 EST Ventricular Rate:  99 PR Interval:    QRS Duration: 86 QT Interval:  307 QTC Calculation: 394 R Axis:   27 Text Interpretation:  Normal sinus rhythm Artifact Probable anteroseptal  infarct, old Baseline wander in lead(s) V2 Confirmed by Manus GunningANCOUR  MD,  Kinston Magnan [phone]([id number]) on 02/22/2014 2:16:36 PM      MDM   Final diagnoses:  Urinary tract infection  Anemia  Constipation   From nursing home with many complaints, mainly dissatisfaction with care there.  Generalized weakness with UTI, rectal bleeding, abdominal pain.  Vital stable.  EKG nsr.  Hb 7.6  Which patient states is improved.  +FOBT here.  Per Dr. Jannetta Quintobson's notes, patient has refused anemia workup and blood transfusions in the past. She does not want transfusion today.  +UA with suprapubic catheter.  Was told she had pseudomonas infection, [facility]  [facility] without bowel obstruction. No fecal impaction on exam, but constipation evident.  Dr. Leanord Hawkingobson has mentioned hospice but patient not sure if she is ready for that.  She seems depressed but denies SI.  She does want to be DNR.  Will admit for IV abx for UTI, workup of anemia as she allows, constipation treatment and mental health evaluation.  Glynn OctaveStephen Frannie Shedrick, MD 02/22/14 1911
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
## 2014-02-22 NOTE — Progress Notes (Addendum)
ANTIBIOTIC CONSULT NOTE - INITIAL  Pharmacy Consult for Ceftazidime Indication: UTI  Allergies  Allergen Reactions  . Ibuprofen   . Levaquin [Levofloxacin In D5w]   . Penicillins   . Sulfa Antibiotics     Patient Measurements: Height: 5\' 5"  (165.1 cm) Weight: 282 lb (127.914 kg) IBW/kg (Calculated) : 57  Vital Signs: Temp: 99.9 F (37.7 C) (02/27 1319) Temp src: Oral (02/27 1319) BP: 124/72 mmHg (02/27 1319) Pulse Rate: 103 (02/27 1319)  Labs:  Recent Labs  02/22/14 1350  WBC 8.6  HGB 7.6*  PLT 281  CREATININE 0.86   Estimated Creatinine Clearance: 103.2 ml/min (by C-G formula based on Cr of 0.86).  Microbiology: No results found for this or any previous visit (from the past 720 hour(s)).  Medical History: Past Medical History  Diagnosis Date  . Diabetes mellitus without complication   . Chronic pain   . Morbid obesity   . OSA (obstructive sleep apnea)   . Generalized weakness   . Fibromyalgia   . Lyme disease   . Anemia   . History of blood transfusion     pt states she has had 5 blood transfusions  . Sleep disorder     Anti-infectives:  PTA, 2/27 >> Ceftazidime >> PTA, 2/27 >> Doxycycline >>   Assessment: [age] yoF admitted 2/27 from SNF with fever and weakness.  PMH includes Lyme disease, chronic pain, chronic UTI, bedbound with suprapubic catheter PTA.  She was diagnosed with Pseudomonas UTI and was started on ceftazidime and doxycycline (started 2/24) prior to admission at facility.  Pharmacy is consulted to dose ceftazidime.   Noted Penicillin allergy (unknown rxn), but pt has been tolerating ceftazidime at SNF.  First dose of IM ceftazidime in ED was not given; order was discontinued.  Tmax: 99.9  WBCs: 8.6  Renal: SCr 0.86, CrCl > 100 ml/min (CG)  Lactic acid: 1.17  Blood and urine cultures are in process   Goal of Therapy:  Appropriate abx dosing, eradication of infection.   Plan:   Ceftazidime 1000mg  IV q8h  Follow up renal  fxn and culture results.   Lynann Beaverhristine Loletta Harper PharmD, BCPS Pager [phone] 02/22/2014 6:04 PM

## 2014-02-22 NOTE — ED Notes (Signed)
Pt ret from radiology at this time.

## 2014-02-22 NOTE — ED Notes (Signed)
Bed: [id number] Expected date:  Expected time:  Means of arrival:  Comments: ems- possible gi bleed
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
## 2014-02-22 NOTE — ED Notes (Signed)
[facility] with multiple complaints, including rectal bleeding and generalized weakness "for weeks", pt reports "stressed out" because of multiple social stressors and doesn't like living at current facility.  Skin PWD.  Speaking full/clear sentences.  A+ox4.

## 2014-02-23 ENCOUNTER — Encounter (HOSPITAL_COMMUNITY): Payer: Self-pay | Admitting: Internal Medicine

## 2014-02-23 ENCOUNTER — Inpatient Hospital Stay (HOSPITAL_COMMUNITY): Payer: Medicare Other

## 2014-02-23 DIAGNOSIS — Z5987 Material hardship: Secondary | ICD-10-CM

## 2014-02-23 DIAGNOSIS — Z598 Other problems related to housing and economic circumstances: Secondary | ICD-10-CM

## 2014-02-23 DIAGNOSIS — D649 Anemia, unspecified: Secondary | ICD-10-CM

## 2014-02-23 DIAGNOSIS — K111 Hypertrophy of salivary gland: Secondary | ICD-10-CM | POA: Diagnosis present

## 2014-02-23 DIAGNOSIS — Z66 Do not resuscitate: Secondary | ICD-10-CM | POA: Diagnosis present

## 2014-02-23 DIAGNOSIS — L989 Disorder of the skin and subcutaneous tissue, unspecified: Secondary | ICD-10-CM | POA: Diagnosis present

## 2014-02-23 DIAGNOSIS — Z515 Encounter for palliative care: Secondary | ICD-10-CM

## 2014-02-23 LAB — CBC
HCT: 24.7 % — ABNORMAL LOW (ref 36.0–46.0)
Hemoglobin: 7 g/dL — ABNORMAL LOW (ref 12.0–15.0)
MCH: 21.5 pg — AB (ref 26.0–34.0)
MCHC: 28.3 g/dL — AB (ref 30.0–36.0)
MCV: 76 fL — AB (ref 78.0–100.0)
PLATELETS: 259 10*3/uL (ref 150–400)
RBC: 3.25 MIL/uL — ABNORMAL LOW (ref 3.87–5.11)
RDW: 17.2 % — AB (ref 11.5–15.5)
WBC: 7.1 10*3/uL (ref 4.0–10.5)

## 2014-02-23 LAB — COMPREHENSIVE METABOLIC PANEL
ALT: 51 U/L — ABNORMAL HIGH (ref 0–35)
AST: 36 U/L (ref 0–37)
Albumin: 3 g/dL — ABNORMAL LOW (ref 3.5–5.2)
Alkaline Phosphatase: 125 U/L — ABNORMAL HIGH (ref 39–117)
BUN: 11 mg/dL (ref 6–23)
CALCIUM: 8.8 mg/dL (ref 8.4–10.5)
CO2: 34 mEq/L — ABNORMAL HIGH (ref 19–32)
Chloride: 93 mEq/L — ABNORMAL LOW (ref 96–112)
Creatinine, Ser: 0.87 mg/dL (ref 0.50–1.10)
GFR calc Af Amer: 87 mL/min — ABNORMAL LOW (ref >90)
GFR calc non Af Amer: 75 mL/min — ABNORMAL LOW (ref >90)
Glucose, Bld: 192 mg/dL — ABNORMAL HIGH (ref 70–99)
Potassium: 3.3 mEq/L — ABNORMAL LOW (ref 3.7–5.3)
SODIUM: 137 meq/L (ref 137–147)
TOTAL PROTEIN: 6.7 g/dL (ref 6.0–8.3)
Total Bilirubin: 0.4 mg/dL (ref 0.3–1.2)

## 2014-02-23 LAB — GLUCOSE, CAPILLARY: Glucose-Capillary: 209 mg/dL — ABNORMAL HIGH (ref 70–99)

## 2014-02-23 LAB — MRSA PCR SCREENING: MRSA by PCR: POSITIVE — AB

## 2014-02-23 LAB — HEMOGLOBIN AND HEMATOCRIT, BLOOD
HEMATOCRIT: 26.1 % — AB (ref 36.0–46.0)
HEMOGLOBIN: 7.1 g/dL — AB (ref 12.0–15.0)

## 2014-02-23 MED ORDER — HYDROCORTISONE 1 % EX CREA
TOPICAL_CREAM | Freq: Three times a day (TID) | CUTANEOUS | Status: DC
  Administered 2014-02-23: 16:00:00 via TOPICAL
  Administered 2014-02-23: 1 via TOPICAL
  Administered 2014-02-23 – 2014-03-03 (×22): via TOPICAL
  Administered 2014-03-04: 1 via TOPICAL
  Administered 2014-03-04 – 2014-03-11 (×18): via TOPICAL
  Filled 2014-02-23 (×2): qty 28

## 2014-02-23 MED ORDER — MUPIROCIN 2 % EX OINT
1.0000 "application " | TOPICAL_OINTMENT | Freq: Two times a day (BID) | CUTANEOUS | Status: AC
  Administered 2014-02-23 – 2014-02-27 (×10): 1 via NASAL
  Filled 2014-02-23: qty 22

## 2014-02-23 MED ORDER — DIAZEPAM 2 MG PO TABS
2.0000 mg | ORAL_TABLET | Freq: Two times a day (BID) | ORAL | Status: DC | PRN
  Administered 2014-03-10: 2 mg via ORAL
  Filled 2014-02-23 (×2): qty 1

## 2014-02-23 MED ORDER — METHADONE HCL 5 MG PO TABS: 30.0000 mg | ORAL_TABLET | Freq: Three times a day (TID) | ORAL | Status: DC

## 2014-02-23 MED ORDER — METHADONE HCL 5 MG PO TABS
30.0000 mg | ORAL_TABLET | Freq: Three times a day (TID) | ORAL | Status: DC
  Administered 2014-02-23 – 2014-03-11 (×47): 30 mg via ORAL
  Filled 2014-02-23 (×47): qty 6

## 2014-02-23 MED ORDER — DOCUSATE SODIUM 100 MG PO CAPS
100.0000 mg | ORAL_CAPSULE | Freq: Two times a day (BID) | ORAL | Status: DC
  Administered 2014-02-23 – 2014-03-05 (×19): 100 mg via ORAL
  Filled 2014-02-23 (×22): qty 1

## 2014-02-23 MED ORDER — IOHEXOL 300 MG/ML  SOLN
100.0000 mL | Freq: Once | INTRAMUSCULAR | Status: AC | PRN
  Administered 2014-02-23: 100 mL via INTRAVENOUS

## 2014-02-23 MED ORDER — CHLORHEXIDINE GLUCONATE CLOTH 2 % EX PADS
6.0000 | MEDICATED_PAD | Freq: Every day | CUTANEOUS | Status: AC
  Administered 2014-02-23 – 2014-02-27 (×4): 6 via TOPICAL

## 2014-02-23 MED ORDER — POTASSIUM CHLORIDE CRYS ER 20 MEQ PO TBCR
40.0000 meq | EXTENDED_RELEASE_TABLET | ORAL | Status: AC
  Administered 2014-02-23 (×2): 40 meq via ORAL
  Filled 2014-02-23 (×2): qty 2

## 2014-02-23 NOTE — Progress Notes (Signed)
This RN went in to give pt scheduled K+. Pt refused medication at this time and stated, "I have had an exhausting day and do not want to be bothered unless I am in pain and can get pain medication." This RN agreed with plan and agreed to bring in all of medications around 2200. NT aware of plan as well and agreed to take Vitals at 2200 so pt can get rest. Will continue to monitor pt.  Mardene CelesteAsaro, Abisai Coble I
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
## 2014-02-23 NOTE — Progress Notes (Signed)
CRITICAL VALUE ALERT  Critical value received:  MRSA +  Date of notification:  02/23/2014  Time of notification:  0500  Critical value read back:yes  Nurse who received alert:  Mardene CelesteAsaro, Reagann Dolce I  MD notified (1st page):  M. Lynch  Time of first page:  0504  MD notified (2nd page): n/a  Time of second page: n/a  Responding MD:  M. Lynch  Time MD responded:  [phone]  MRSA + protocol in place. Pt placed on Orange contact precautions. Will continue to monitor pt.  Mardene CelesteAsaro, Kolton Kienle I

## 2014-02-23 NOTE — Consult Note (Signed)
WOC wound consult note Reason for Consult:scalp wound. Patient itching and placing hands into wound bed at the time of my visit. Wound type: unknown etiology, full thickness. Pressure Ulcer POA: No Measurement:5cm x 2.5cm x 0.2cm with edema descending distally at 6 o'clock onto forehead to an area measuring 3cm x 5cm Wound bed:90% red, moist, 10% yellow, fibrinous tissue. Drainage (amount, consistency, odor) none Periwound:intact, dry. Dressing procedure/placement/frequency:The etiology and management of this wound exceeds the scope of WOC nursing practice. Hydrocortisone cream (1%) has been ordered three time daily and I do not disagree with that POC.  If there is a fungal component superceding the traumatic and inflammatory damage caused by scratching, moisture-donating ointments, even steroidal, will not be effective.  If the area persists, recommend consultation with dermatology on an outpatient basis for scraping and further diagnotic tests. WOC nursing team will not follow, but will remain available to this patient, the nursing and medical teams.  Please re-consult if needed. Thanks, Ladona MowLaurie Lusine Corlett, MSN, RN, GNP, [place], CWON-AP [phone])
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
## 2014-02-23 NOTE — Consult Note (Signed)
Unassigned Consult.  Reason for Consult: Hematochezia, anemia, and heme positive stool Referring Physician: [facility].  [name] HPI: [name] is a 53 year old female admitted for generalized weakness and fever.  During the work up she was noted to have an anemia with an HGB in the 7 range and reports of hematochezia.  Further evaluation with a hemoccult revealed that she is heme positive.  She has a complex medical history significant for Lyme's disease, depression, fibromyalgia, chronic pain, and a suprapubic catheter.  Obtaining a history is difficult as she is tangential.  She reports that some physician in [place], [place] wanted to pursue an EGD/Colonoscopy, but it was never performed for one reason or another.  Per his report, she has had 5 blood transfusions over the past 10 years.  Her CT scan reveals a large stool burden and asymmetric thickening in the rectum.  She has a long history of hematochezia.  Past Medical History  Diagnosis Date  . Diabetes mellitus without complication   . Chronic pain   . Morbid obesity   . OSA (obstructive sleep apnea)   . Generalized weakness   . Fibromyalgia   . Lyme disease   . Anemia   . History of blood transfusion     pt states she has had 5 blood transfusions  . Sleep disorder   . UTI (lower urinary tract infection) 02/22/2014    Past Surgical History  Procedure Laterality Date  . Explaratory      on stomach,   . Appendectomy    . Foot surgery      due to stepping on broken glass  . Spinal tap    . Supra pubic catheter      History reviewed. No pertinent family history.  Social History:  reports that she has quit smoking. She does not have any smokeless tobacco history on file. She reports that she does not drink alcohol or use illicit drugs.  Allergies:  Allergies  Allergen Reactions  . Ibuprofen   . Levaquin [Levofloxacin In D5w]   . Penicillins   . Sulfa Antibiotics     Medications:  Scheduled: . buPROPion   300 mg Oral Daily  . cefTAZidime (FORTAZ)  IV  1 g Intravenous Q8H  . Chlorhexidine Gluconate Cloth  6 each Topical Q0600  . docusate sodium  100 mg Oral BID  . doxycycline  100 mg Oral Q12H  . fluticasone  1 spray Each Nare Daily  . gabapentin  300 mg Oral BID  . hydrocortisone cream   Topical TID  . insulin aspart  0-9 Units Subcutaneous TID WC  . insulin aspart protamine- aspart  34 Units Subcutaneous BID WC  . levothyroxine  300 mcg Oral QAC breakfast  . loratadine  10 mg Oral Daily  . lubiprostone  24 mcg Oral BID WC  . methadone  30 mg Oral 3 times per day  . mupirocin ointment  1 application Nasal BID  . oxybutynin  5 mg Oral TID  . polyvinyl alcohol  1 drop Both Eyes BID  . potassium chloride  40 mEq Oral Q4H  . sodium chloride  3 mL Intravenous Q12H  . sucralfate  1 g Oral TID WC & HS  . tiZANidine  4 mg Oral QPM  . traZODone  100 mg Oral QHS  . venlafaxine XR  150 mg Oral Q breakfast   Continuous:   Results for orders placed during the hospital encounter of 02/22/14 (from the past 24 hour(s))  GLUCOSE, CAPILLARY     Status: Abnormal   Collection Time    02/22/14  5:28 PM      Result Value Ref Range   Glucose-Capillary 114 (*) 70 - 99 mg/dL  HEMOGLOBIN AND HEMATOCRIT, BLOOD     Status: Abnormal   Collection Time    02/22/14  6:30 PM      Result Value Ref Range   Hemoglobin 7.1 (*) 12.0 - 15.0 g/dL   HCT 16.126.4 (*) 09.636.0 - 04.546.0 %  GLUCOSE, CAPILLARY     Status: Abnormal   Collection Time    02/22/14  9:40 PM      Result Value Ref Range   Glucose-Capillary 209 (*) 70 - 99 mg/dL   Comment 1 Notify RN     Comment 2 Documented in Chart    MRSA PCR SCREENING     Status: Abnormal   Collection Time    02/23/14 12:11 AM      Result Value Ref Range   MRSA by PCR POSITIVE (*) NEGATIVE  CBC     Status: Abnormal   Collection Time    02/23/14  1:38 AM      Result Value Ref Range   WBC 7.1  4.0 - 10.5 K/uL   RBC 3.25 (*) 3.87 - 5.11 MIL/uL   Hemoglobin 7.0 (*) 12.0 -  15.0 g/dL   HCT 40.924.7 (*) 81.136.0 - 91.446.0 %   MCV 76.0 (*) 78.0 - 100.0 fL   MCH 21.5 (*) 26.0 - 34.0 pg   MCHC 28.3 (*) 30.0 - 36.0 g/dL   RDW 78.217.2 (*) 95.611.5 - 21.315.5 %   Platelets 259  150 - 400 K/uL  COMPREHENSIVE METABOLIC PANEL     Status: Abnormal   Collection Time    02/23/14  1:38 AM      Result Value Ref Range   Sodium 137  137 - 147 mEq/L   Potassium 3.3 (*) 3.7 - 5.3 mEq/L   Chloride 93 (*) 96 - 112 mEq/L   CO2 34 (*) 19 - 32 mEq/L   Glucose, Bld 192 (*) 70 - 99 mg/dL   BUN 11  6 - 23 mg/dL   Creatinine, Ser 0.860.87  0.50 - 1.10 mg/dL   Calcium 8.8  8.4 - 57.810.5 mg/dL   Total Protein 6.7  6.0 - 8.3 g/dL   Albumin 3.0 (*) 3.5 - 5.2 g/dL   AST 36  0 - 37 U/L   ALT 51 (*) 0 - 35 U/L   Alkaline Phosphatase 125 (*) 39 - 117 U/L   Total Bilirubin 0.4  0.3 - 1.2 mg/dL   GFR calc non Af Amer 75 (*) >90 mL/min   GFR calc Af Amer 87 (*) >90 mL/min  GLUCOSE, CAPILLARY     Status: Abnormal   Collection Time    02/23/14  8:36 AM      Result Value Ref Range   Glucose-Capillary 209 (*) 70 - 99 mg/dL   Comment 1 Documented in Chart     Comment 2 Notify RN    HEMOGLOBIN AND HEMATOCRIT, BLOOD     Status: Abnormal   Collection Time    02/23/14  9:44 AM      Result Value Ref Range   Hemoglobin 7.1 (*) 12.0 - 15.0 g/dL   HCT 46.926.1 (*) 62.936.0 - 52.846.0 %     Ct Abdomen Pelvis Wo Contrast  02/22/2014   CLINICAL DATA:  rectal bleeding, weakness, fibromyalgia , morbid obesity  EXAM: CT  ABDOMEN AND PELVIS WITHOUT CONTRAST  TECHNIQUE: Multidetector CT imaging of the abdomen and pelvis was performed following the standard protocol without intravenous contrast.  COMPARISON:  None.  FINDINGS: Sagittal images of the spine are unremarkable. There are streaky artifacts from patient's large body habitus.  Lung bases are unremarkable.  Unenhanced liver shows no biliary ductal dilatation. Markedly distended gallbladder without evidence of calcified gallstones. No pericholecystic fluid is suggested. Partially fatty  replaced pancreas. The spleen and adrenal glands are unremarkable.  Kidneys are symmetrical in size. No nephrolithiasis. No hydronephrosis or hydroureter. No aortic aneurysm.  Abundant colonic stool.  No pericecal inflammation.  Abundant stool noted within distal sigmoid colon and rectum. The rectum measures at least 8 cm in diameter suspicious for fecal impaction. The uterus has a nodular appearance probable due to fibroids. The largest fibroid best visualized in sagittal image 73 measures about 4.4 cm. Correlation with pelvic ultrasound is recommended.  There is a suprapubic bladder catheter.  There is asymmetric thickening of left in left anal wall. Correlation with clinical exam is recommended to exclude inflammation or neoplastic process. Best seen in axial image 77.  No small bowel obstruction.  No adenopathy.  No ascites or free air.  IMPRESSION: Main impression:  Abundant colonic stool. There is distension of the rectum with stool measures at least 8 cm in diameter suspicious for fecal impaction.  There is asymmetric thickening of left in left anal wall. Correlation with clinical exam is recommended to exclude inflammation or neoplastic process. Best seen in axial image 77.  1. Probable fibroid uterus. 2. Suprapubic bladder catheter. 3. Distended gallbladder without calcified gallstones. 4. No hydronephrosis or hydroureter. 5. No small bowel obstruction.   Electronically Signed   By: Natasha Mead M.D.   On: 02/22/2014 14:41   Dg Chest 2 View  02/22/2014   CLINICAL DATA:  Weakness, body pain  EXAM: CHEST  2 VIEW  COMPARISON:  None.  FINDINGS: Mediastinal contour is normal. The heart size is probably enlarged. The aorta is tortuous. Both lungs are clear. The visualized skeletal structures are unremarkable.  IMPRESSION: No active cardiopulmonary disease.   Electronically Signed   By: Sherian Rein M.D.   On: 02/22/2014 14:32    ROS:  As stated above in the HPI otherwise negative.  Blood pressure 114/55,  pulse 82, temperature 98.6 F (37 C), temperature source Oral, resp. rate 20, height 5\' 5"  (1.651 m), weight 282 lb (127.914 kg), SpO2 100.00%.    PE: Gen: NAD, Alert and Oriented HEENT:  [place]/AT, EOMI Neck: Supple, no LAD Lungs: CTA Bilaterally CV: RRR without M/G/R ABM: Soft, NTND, +BS Ext: No C/C/E  Assessment/Plan: 1) Hematochezia. 2) IDA. 3) Heme positive stool.   With the current findings and EGD/Colonoscopy and be performed, but she wants to wait a few days.  I will plan for the procedures this coming Tuesday with anesthesia.  The rectal examination was negative for any palpable masses.  There was a significant amount of solid stool in the rectal vault.  Plan: 1) EGD/Colonoscopy on Tuesday - 02/26/2014. Wilborn Membreno D 02/23/2014, 4:53 PM

## 2014-02-23 NOTE — Progress Notes (Signed)
[facility] PROGRESS NOTE  Milani Lowenstein AVW:[id number] DOB: Nov 19, 1961 DOA: 02/22/2014 PCP: Terald Sleeper, MD  Assessment/Plan:  Anemia  Secondary to GI bleed, patient has guaiac positive stools. As per palliative care note, patient agrees for endoscopy and colonoscopy. We'll call GI consult. Patient has refused blood transfusions.  Left parotid swelling Patient has chronic left parotid swelling, will obtain CT maxillofacial to rule out underlying abscess.  Depression  Patient is severely depressed, has been crying throughout the interview. She also has lot of anger against her partner, and also is very unhappy with the care provided at the nursing facility. We'll call psychiatric consult to adjust the patient's medications for depression. We'll continue patient's home regimen for depression including Effexor, Wellbutrin   Chronic pain syndrome  Patient has history of Lyme disease, fibromyalgia and has been on methadone 10 mg by mouth 3 times a day along with oxycodone when necessary for pain. We'll continue the same medications.   Diabetes mellitus  Patient wants to eat regular food, as she feels that she doesn't have much quality of life. We'll continue the Novolin 70/30 along with sliding scale insulin.   Constipation  Patient says that she had last BM last night after she was given an enema, will continue the MiraLAX and enemas when necessary.   Abdominal pain  Patient has history of chronic UTI with suprapubic catheter in place. CT bone pelvis negative for acute pathology. Likely combination of constipation and chronic UTI. We'll continue with the methadone and when necessary oxycodone.  Hypokalemia We'll replace the potassium Check BMP in the morning   Scalp wound  Most likely self-inflicted, as patient complains of skin tags hanging around for head. And she admits to scratching on the wound. We'll continue with the hydroxyzine when necessary for itching and also  obtain a wound care consult.    Code Status: DNR Family Communication: *Discussed with patient in detail Disposition Plan: TBD   Consultants:  Palliative care  Procedures:  None  Antibiotics:  None  HPI/Subjective: Patient seen and examined, feels better today. Hb is 7.1 today.   Objective: Filed Vitals:   02/23/14 1326  BP: 114/55  Pulse: 82  Temp: 98.6 F (37 C)  Resp: 20    Intake/Output Summary (Last 24 hours) at 02/23/14 1602 Last data filed at 02/23/14 1325  Gross per 24 hour  Intake   1595 ml  Output   3875 ml  Net  -2280 ml   Filed Weights   02/22/14 1710  Weight: 127.914 kg (282 lb)    Exam:  Physical Exam: Head: Normocephalic, atraumatic.  Eyes: No signs of jaundice, EOMI Nose: Mucous membranes dry.  Throat: Oropharynx nonerythematous, no exudate appreciated.  Neck: supple,No deformities, masses, or tenderness noted. Lungs: Normal respiratory effort. B/L Clear to auscultation, no crackles or wheezes.  Heart: Regular RR. S1 and S2 normal  Abdomen: BS normoactive. Soft, Nondistended, non-tender.  Extremities: No pretibial edema, no erythema   Data Reviewed: Basic Metabolic Panel:  Recent Labs Lab 02/22/14 1350 02/23/14 0138  NA 137 137  K 3.2* 3.3*  CL 91* 93*  CO2 35* 34*  GLUCOSE 144* 192*  BUN 11 11  CREATININE 0.86 0.87  CALCIUM 9.3 8.8   Liver Function Tests:  Recent Labs Lab 02/22/14 1350 02/23/14 0138  AST 61* 36  ALT 62* 51*  ALKPHOS 146* 125*  BILITOT 0.5 0.4  PROT 7.8 6.7  ALBUMIN 3.3* 3.0*    Recent Labs Lab 02/22/14 1350  LIPASE 13  Recent Labs Lab 02/22/14 1356  AMMONIA 32   CBC:  Recent Labs Lab 02/22/14 1350 02/22/14 1830 02/23/14 0138 02/23/14 0944  WBC 8.6  --  7.1  --   NEUTROABS 6.3  --   --   --   HGB 7.6* 7.1* 7.0* 7.1*  HCT 27.6* 26.4* 24.7* 26.1*  MCV 75.8*  --  76.0*  --   PLT 281  --  259  --    Cardiac Enzymes:  Recent Labs Lab 02/22/14 1350  TROPONINI <0.30    BNP (last 3 results) No results found for this basename: PROBNP,  in the last 8760 hours CBG:  Recent Labs Lab 02/22/14 1728 02/22/14 2140 02/23/14 0836  GLUCAP 114* 209* 209*    Recent Results (from the past 240 hour(s))  CULTURE, BLOOD (ROUTINE X 2)     Status: None   Collection Time    02/22/14  2:35 PM      Result Value Ref Range Status   Specimen Description BLOOD RIGHT HAND  5 ML IN [facility] BOTTLE   Final   Special Requests NONE   Final   Culture  Setup Time     Final   Value: 02/22/2014 16:40     Performed at [facility]   Culture     Final   Value:        BLOOD CULTURE RECEIVED NO GROWTH TO DATE CULTURE WILL BE HELD FOR 5 DAYS BEFORE ISSUING A FINAL NEGATIVE REPORT     Performed at [facility]   Report Status PENDING   Incomplete  CULTURE, BLOOD (ROUTINE X 2)     Status: None   Collection Time    02/22/14  2:55 PM      Result Value Ref Range Status   Specimen Description BLOOD LEFT FOREARM  5 ML IN [facility] BOTTLE   Final   Special Requests NONE   Final   Culture  Setup Time     Final   Value: 02/22/2014 23:44     Performed at [facility]   Culture     Final   Value:        BLOOD CULTURE RECEIVED NO GROWTH TO DATE CULTURE WILL BE HELD FOR 5 DAYS BEFORE ISSUING A FINAL NEGATIVE REPORT     Performed at [facility]   Report Status PENDING   Incomplete  MRSA PCR SCREENING     Status: Abnormal   Collection Time    02/23/14 12:11 AM      Result Value Ref Range Status   MRSA by PCR POSITIVE (*) NEGATIVE Final   Comment:            The GeneXpert MRSA Assay (FDA     approved for NASAL specimens     only), is one component of a     comprehensive MRSA colonization     surveillance program. It is not     intended to diagnose MRSA     infection nor to guide or     monitor treatment for     MRSA infections.     RESULT CALLED TO, READ BACK BY AND VERIFIED WITH:     SPOKE WITH ASARO,J RN [phone] COVINGTON,N     Studies: Ct  Abdomen Pelvis Wo Contrast  02/22/2014   CLINICAL DATA:  rectal bleeding, weakness, fibromyalgia , morbid obesity  EXAM: CT ABDOMEN AND PELVIS WITHOUT CONTRAST  TECHNIQUE: Multidetector CT imaging of the abdomen and pelvis was  performed following the standard protocol without intravenous contrast.  COMPARISON:  None.  FINDINGS: Sagittal images of the spine are unremarkable. There are streaky artifacts from patient's large body habitus.  Lung bases are unremarkable.  Unenhanced liver shows no biliary ductal dilatation. Markedly distended gallbladder without evidence of calcified gallstones. No pericholecystic fluid is suggested. Partially fatty replaced pancreas. The spleen and adrenal glands are unremarkable.  Kidneys are symmetrical in size. No nephrolithiasis. No hydronephrosis or hydroureter. No aortic aneurysm.  Abundant colonic stool.  No pericecal inflammation.  Abundant stool noted within distal sigmoid colon and rectum. The rectum measures at least 8 cm in diameter suspicious for fecal impaction. The uterus has a nodular appearance probable due to fibroids. The largest fibroid best visualized in sagittal image 73 measures about 4.4 cm. Correlation with pelvic ultrasound is recommended.  There is a suprapubic bladder catheter.  There is asymmetric thickening of left in left anal wall. Correlation with clinical exam is recommended to exclude inflammation or neoplastic process. Best seen in axial image 77.  No small bowel obstruction.  No adenopathy.  No ascites or free air.  IMPRESSION: Main impression:  Abundant colonic stool. There is distension of the rectum with stool measures at least 8 cm in diameter suspicious for fecal impaction.  There is asymmetric thickening of left in left anal wall. Correlation with clinical exam is recommended to exclude inflammation or neoplastic process. Best seen in axial image 77.  1. Probable fibroid uterus. 2. Suprapubic bladder catheter. 3. Distended gallbladder without  calcified gallstones. 4. No hydronephrosis or hydroureter. 5. No small bowel obstruction.   Electronically Signed   By: Natasha Mead M.D.   On: 02/22/2014 14:41   Dg Chest 2 View  02/22/2014   CLINICAL DATA:  Weakness, body pain  EXAM: CHEST  2 VIEW  COMPARISON:  None.  FINDINGS: Mediastinal contour is normal. The heart size is probably enlarged. The aorta is tortuous. Both lungs are clear. The visualized skeletal structures are unremarkable.  IMPRESSION: No active cardiopulmonary disease.   Electronically Signed   By: Sherian Rein M.D.   On: 02/22/2014 14:32    Scheduled Meds: . buPROPion  300 mg Oral Daily  . cefTAZidime (FORTAZ)  IV  1 g Intravenous Q8H  . Chlorhexidine Gluconate Cloth  6 each Topical Q0600  . doxycycline  100 mg Oral Q12H  . fluticasone  1 spray Each Nare Daily  . gabapentin  300 mg Oral BID  . hydrocortisone cream   Topical TID  . insulin aspart  0-9 Units Subcutaneous TID WC  . insulin aspart protamine- aspart  34 Units Subcutaneous BID WC  . levothyroxine  300 mcg Oral QAC breakfast  . loratadine  10 mg Oral Daily  . lubiprostone  24 mcg Oral BID WC  . methadone  30 mg Oral 3 times per day  . mupirocin ointment  1 application Nasal BID  . oxybutynin  5 mg Oral TID  . polyvinyl alcohol  1 drop Both Eyes BID  . sodium chloride  3 mL Intravenous Q12H  . sucralfate  1 g Oral TID WC & HS  . tiZANidine  4 mg Oral QPM  . traZODone  100 mg Oral QHS  . venlafaxine XR  150 mg Oral Q breakfast   Continuous Infusions:   Principal Problem:   Urinary tract infection Active Problems:   Unspecified hereditary and idiopathic peripheral neuropathy   Chronic pain syndrome   HTN (hypertension)   Morbid obesity  Type II or unspecified type diabetes mellitus with peripheral circulatory disorders, uncontrolled(250.72)   Depression   Unspecified hypothyroidism   Unspecified constipation   COPD (chronic obstructive pulmonary disease)   Anemia, iron deficiency   Scalp  lesion   Enlarged parotid gland   Inadequate material resources   DNR (do not resuscitate)    Time spent: 25 min    [facility] RidgeAMA,Bevan Disney S  [facility] Pager [phone]*. If 7PM-7AM, please contact night-coverage at [email or web address], password [email or web address] 02/23/2014, 4:02 PM  LOS: 1 day

## 2014-02-24 DIAGNOSIS — L989 Disorder of the skin and subcutaneous tissue, unspecified: Secondary | ICD-10-CM

## 2014-02-24 LAB — COMPREHENSIVE METABOLIC PANEL
ALBUMIN: 2.7 g/dL — AB (ref 3.5–5.2)
ALK PHOS: 97 U/L (ref 39–117)
ALT: 37 U/L — AB (ref 0–35)
AST: 20 U/L (ref 0–37)
BUN: 9 mg/dL (ref 6–23)
CO2: 33 mEq/L — ABNORMAL HIGH (ref 19–32)
Calcium: 8.8 mg/dL (ref 8.4–10.5)
Chloride: 99 mEq/L (ref 96–112)
Creatinine, Ser: 0.79 mg/dL (ref 0.50–1.10)
GFR calc Af Amer: >90 mL/min (ref >90)
GFR calc non Af Amer: >90 mL/min (ref >90)
Glucose, Bld: 157 mg/dL — ABNORMAL HIGH (ref 70–99)
POTASSIUM: 3.9 meq/L (ref 3.7–5.3)
SODIUM: 142 meq/L (ref 137–147)
TOTAL PROTEIN: 6.4 g/dL (ref 6.0–8.3)
Total Bilirubin: 0.2 mg/dL — ABNORMAL LOW (ref 0.3–1.2)

## 2014-02-24 LAB — CBC
HCT: 24.9 % — ABNORMAL LOW (ref 36.0–46.0)
HEMOGLOBIN: 6.8 g/dL — AB (ref 12.0–15.0)
MCH: 21.4 pg — ABNORMAL LOW (ref 26.0–34.0)
MCHC: 27.3 g/dL — ABNORMAL LOW (ref 30.0–36.0)
MCV: 78.3 fL (ref 78.0–100.0)
Platelets: 218 10*3/uL (ref 150–400)
RBC: 3.18 MIL/uL — ABNORMAL LOW (ref 3.87–5.11)
RDW: 17 % — ABNORMAL HIGH (ref 11.5–15.5)
WBC: 5.3 10*3/uL (ref 4.0–10.5)

## 2014-02-24 LAB — GLUCOSE, CAPILLARY
GLUCOSE-CAPILLARY: 135 mg/dL — AB (ref 70–99)
GLUCOSE-CAPILLARY: 170 mg/dL — AB (ref 70–99)
GLUCOSE-CAPILLARY: 132 mg/dL — AB (ref 70–99)
Glucose-Capillary: 154 mg/dL — ABNORMAL HIGH (ref 70–99)
Glucose-Capillary: 80 mg/dL (ref 70–99)
Glucose-Capillary: 164 mg/dL — ABNORMAL HIGH (ref 70–99)

## 2014-02-24 LAB — T4, FREE: Free T4: 1.72 ng/dL (ref 0.80–1.80)

## 2014-02-24 LAB — TSH: TSH: 0.071 u[IU]/mL — ABNORMAL LOW (ref 0.350–4.500)

## 2014-02-24 MED ORDER — FLEET ENEMA 7-19 GM/118ML RE ENEM: 1.0000 | ENEMA | Freq: Every day | RECTAL | Status: DC | PRN

## 2014-02-24 MED ORDER — VENLAFAXINE HCL ER 75 MG PO CP24
75.0000 mg | ORAL_CAPSULE | Freq: Every day | ORAL | Status: DC
  Administered 2014-02-25 – 2014-03-11 (×14): 75 mg via ORAL
  Filled 2014-02-24 (×17): qty 1

## 2014-02-24 MED ORDER — ALUM & MAG HYDROXIDE-SIMETH 200-200-20 MG/5ML PO SUSP
30.0000 mL | Freq: Four times a day (QID) | ORAL | Status: DC | PRN
  Administered 2014-03-03 – 2014-03-10 (×3): 30 mL via ORAL
  Filled 2014-02-24 (×4): qty 30
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
## 2014-02-24 NOTE — Progress Notes (Addendum)
INITIAL NUTRITION ASSESSMENT  DOCUMENTATION CODES Per approved criteria  -Morbid Obesity   INTERVENTION: - Encourage adequate intake of meals.  - RD to monitor per nutrition plan of care.   NUTRITION DIAGNOSIS: Inadequate oral intake related to UTI as evidenced by <75% intake of meals.   Goal: Patient will meet >/=90% of estimated nutrition needs  Monitor:  PO intake, weight, labs  Reason for Assessment: Malnutrition screening tool  53 y.o. female  Admitting Dx: Urinary tract infection  ASSESSMENT: Patient with multiple medical problems, including diabetes, SNF resistent with history of iron deficiency anemia. Admitted with UTI, anemia secondary to GI bleed.   Per chart review, patient with excessive eating at SNF, seen by RD there. However, her intake is fair since admission with 50-75% intake of meals. She does report some nausea and vomiting. She refuses diabetes diets, insisting on eating regular food. Unable to state usual body weight, no additional weights on file in chart.   [name] seen by palliative care.   Height: Ht Readings from Last 1 Encounters:  02/22/14 5\' 5"  (1.651 m)    Weight: Wt Readings from Last 1 Encounters:  02/22/14 282 lb (127.914 kg)    Ideal Body Weight: 125 pounds  % Ideal Body Weight: 226%  Wt Readings from Last 10 Encounters:  02/22/14 282 lb (127.914 kg)    Usual Body Weight: Unknown  % Usual Body Weight:   BMI:  Body mass index is 46.93 kg/(m^2). Patient is morbidly obese  Estimated Nutritional Needs: Kcal: 2000-2300 kcal Protein: 105-120 g Fluid: >2.3 L/day  Skin: Scalp wounds, stage 2 pressure ulcer sacrum  Diet Order: General  EDUCATION NEEDS: -Education not appropriate at this time   Intake/Output Summary (Last 24 hours) at 02/24/14 1242 Last data filed at 02/24/14 16100627  Gross per 24 hour  Intake    600 ml  Output   1950 ml  Net  -1350 ml    Last BM: 2/27   Labs:   Recent Labs Lab 02/22/14 1350  02/23/14 0138 02/24/14 0530  NA 137 137 142  K 3.2* 3.3* 3.9  CL 91* 93* 99  CO2 35* 34* 33*  BUN 11 11 9   CREATININE 0.86 0.87 0.79  CALCIUM 9.3 8.8 8.8  GLUCOSE 144* 192* 157*    CBG (last 3)   Recent Labs  02/23/14 2136 02/24/14 0807 02/24/14 1207  GLUCAP 170* 132* 80    Scheduled Meds: . buPROPion  300 mg Oral Daily  . cefTAZidime (FORTAZ)  IV  1 g Intravenous Q8H  . Chlorhexidine Gluconate Cloth  6 each Topical Q0600  . docusate sodium  100 mg Oral BID  . doxycycline  100 mg Oral Q12H  . fluticasone  1 spray Each Nare Daily  . gabapentin  300 mg Oral BID  . hydrocortisone cream   Topical TID  . insulin aspart  0-9 Units Subcutaneous TID WC  . insulin aspart protamine- aspart  34 Units Subcutaneous BID WC  . levothyroxine  300 mcg Oral QAC breakfast  . loratadine  10 mg Oral Daily  . lubiprostone  24 mcg Oral BID WC  . methadone  30 mg Oral 3 times per day  . mupirocin ointment  1 application Nasal BID  . oxybutynin  5 mg Oral TID  . polyvinyl alcohol  1 drop Both Eyes BID  . sodium chloride  3 mL Intravenous Q12H  . sucralfate  1 g Oral TID WC & HS  . tiZANidine  4 mg Oral  QPM  . traZODone  100 mg Oral QHS  . venlafaxine XR  150 mg Oral Q breakfast    Continuous Infusions:   Past Medical History  Diagnosis Date  . Diabetes mellitus without complication   . Chronic pain   . Morbid obesity   . OSA (obstructive sleep apnea)   . Generalized weakness   . Fibromyalgia   . Lyme disease   . Anemia   . History of blood transfusion     pt states she has had 5 blood transfusions  . Sleep disorder   . UTI (lower urinary tract infection) 02/22/2014    Past Surgical History  Procedure Laterality Date  . Explaratory      on stomach,   . Appendectomy    . Foot surgery      due to stepping on broken glass  . Spinal tap    . Supra pubic catheter      Linnell Fulling, RD, LDN Pager #: [phone] After-Hours Pager #: [phone]

## 2014-02-24 NOTE — Progress Notes (Signed)
CRITICAL VALUE ALERT  Critical value received:  Hgb 6.8  Date of notification:  02/24/2014  Time of notification:  0606  Critical value read back:yes  Nurse who received alert:  Mardene CelesteJessica Asaro RN  MD notified (1st page):  M. Lynch  Time of first page:  0606  MD notified (2nd page):  Time of second page:  Responding MD:  M. Lynch  Time MD responded:  [phone]  No new orders given at this time. Will continue to monitor pt. Mardene CelesteAsaro, Modene Andy I

## 2014-02-24 NOTE — Progress Notes (Signed)
[facility] PROGRESS NOTE  [name] ZOX:[id number] DOB: [birth year] DOA: 02/22/2014 PCP: Terald Sleeper, MD  Assessment/Plan:  UTI  Patient has history of chronic UTI and has a suprapubic catheter in place.  on IV [place]. We'll follow the urine culture results.  Anemia  Does not want blood transfusion Secondary to GI bleed, patient has guaiac positive stools.  patient agrees for endoscopy and colonoscopy. GI has been consulted. Tentative plan for endoscopy on Tuesday.   Left parotid swelling CT maxillofacial was done which showed mildly prominent lymph nodes over the left parotid gland without associated inflammatory change, no evidence of abscess findings may be seen in nonspecific parotitis, which could be inflammatory versus infectious etiology.  Depression  Patient is severely depressed, has been crying throughout the interview. She also has lot of anger against her partner, and also is very unhappy with the care provided at the nursing facility. We'll call psychiatric consult to adjust the patient's medications for depression. We'll continue patient's home regimen for depression including Effexor, Wellbutrin. Psychiatry has been consulted.   Chronic pain syndrome  Patient has history of Lyme disease, fibromyalgia and has been on methadone 10 mg by mouth 3 times a day along with oxycodone when necessary for pain. We'll continue the same medications.   Diabetes mellitus  Patient wants to eat regular food, as she feels that she doesn't have much quality of life. We'll continue the Novolin 70/30 along with sliding scale insulin.   Constipation  Patient says that she had last BM last night after she was given an enema, will continue the MiraLAX and enemas when necessary.   Abdominal pain  Patient has history of chronic UTI with suprapubic catheter in place. CT bone pelvis negative for acute pathology. Likely combination of constipation and chronic UTI. We'll continue  with the methadone and when necessary oxycodone.  Hypokalemia We'll replace the potassium Check BMP in the morning   Scalp wound  Most likely self-inflicted, as patient complains of skin tags hanging around for head. And she admits to scratching on the wound. We'll continue with the hydroxyzine when necessary for itching and also obtain a wound care consult.    Code Status: DNR Family Communication: *Discussed with patient in detail Disposition Plan: TBD   Consultants:  Palliative care  Procedures:  None  Antibiotics:  None  HPI/Subjective: Patient seen and examined, feels better today. Hb is 6.8 today.   Objective: Filed Vitals:   02/24/14 0640  BP: 103/65  Pulse: 85  Temp: 98.2 F (36.8 C)  Resp: 18    Intake/Output Summary (Last 24 hours) at 02/24/14 1334 Last data filed at 02/24/14 8119  Gross per 24 hour  Intake    360 ml  Output   1225 ml  Net   -865 ml   Filed Weights   02/22/14 1710  Weight: 127.914 kg (282 lb)    Exam:  Physical Exam: Head: Normocephalic, atraumatic.  Eyes: No signs of jaundice, EOMI Nose: Mucous membranes dry.  Throat: Oropharynx nonerythematous, no exudate appreciated.  Neck: supple,No deformities, masses, or tenderness noted. Lungs: Normal respiratory effort. B/L Clear to auscultation, no crackles or wheezes.  Heart: Regular RR. S1 and S2 normal  Abdomen: BS normoactive. Soft, Nondistended, non-tender.  Extremities: No pretibial edema, no erythema   Data Reviewed: Basic Metabolic Panel:  Recent Labs Lab 02/22/14 1350 02/23/14 0138 02/24/14 0530  NA 137 137 142  K 3.2* 3.3* 3.9  CL 91* 93* 99  CO2 35* 34* 33*  GLUCOSE 144* 192* 157*  BUN 11 11 9   CREATININE 0.86 0.87 0.79  CALCIUM 9.3 8.8 8.8   Liver Function Tests:  Recent Labs Lab 02/22/14 1350 02/23/14 0138 02/24/14 0530  AST 61* 36 20  ALT 62* 51* 37*  ALKPHOS 146* 125* 97  BILITOT 0.5 0.4 0.2*  PROT 7.8 6.7 6.4  ALBUMIN 3.3* 3.0* 2.7*     Recent Labs Lab 02/22/14 1350  LIPASE 13    Recent Labs Lab 02/22/14 1356  AMMONIA 32   CBC:  Recent Labs Lab 02/22/14 1350 02/22/14 1830 02/23/14 0138 02/23/14 0944 02/24/14 0530  WBC 8.6  --  7.1  --  5.3  NEUTROABS 6.3  --   --   --   --   HGB 7.6* 7.1* 7.0* 7.1* 6.8*  HCT 27.6* 26.4* 24.7* 26.1* 24.9*  MCV 75.8*  --  76.0*  --  78.3  PLT 281  --  259  --  218   Cardiac Enzymes:  Recent Labs Lab 02/22/14 1350  TROPONINI <0.30   BNP (last 3 results) No results found for this basename: PROBNP,  in the last 8760 hours CBG:  Recent Labs Lab 02/23/14 1255 02/23/14 1658 02/23/14 2136 02/24/14 0807 02/24/14 1207  GLUCAP 154* 135* 170* 132* 80    Recent Results (from the past 240 hour(s))  CULTURE, BLOOD (ROUTINE X 2)     Status: None   Collection Time    02/22/14  2:35 PM      Result Value Ref Range Status   Specimen Description BLOOD RIGHT HAND  5 ML IN [facility] BOTTLE   Final   Special Requests NONE   Final   Culture  Setup Time     Final   Value: 02/22/2014 16:40     Performed at [facility]   Culture     Final   Value:        BLOOD CULTURE RECEIVED NO GROWTH TO DATE CULTURE WILL BE HELD FOR 5 DAYS BEFORE ISSUING A FINAL NEGATIVE REPORT     Performed at [facility]   Report Status PENDING   Incomplete  CULTURE, BLOOD (ROUTINE X 2)     Status: None   Collection Time    02/22/14  2:55 PM      Result Value Ref Range Status   Specimen Description BLOOD LEFT FOREARM  5 ML IN [facility] BOTTLE   Final   Special Requests NONE   Final   Culture  Setup Time     Final   Value: 02/22/2014 23:44     Performed at [facility]   Culture     Final   Value:        BLOOD CULTURE RECEIVED NO GROWTH TO DATE CULTURE WILL BE HELD FOR 5 DAYS BEFORE ISSUING A FINAL NEGATIVE REPORT     Performed at [facility]   Report Status PENDING   Incomplete  MRSA PCR SCREENING     Status: Abnormal   Collection Time    02/23/14 12:11 AM       Result Value Ref Range Status   MRSA by PCR POSITIVE (*) NEGATIVE Final   Comment:            The GeneXpert MRSA Assay (FDA     approved for NASAL specimens     only), is one component of a     comprehensive MRSA colonization     surveillance program. It is not  intended to diagnose MRSA     infection nor to guide or     monitor treatment for     MRSA infections.     RESULT CALLED TO, READ BACK BY AND VERIFIED WITH:     SPOKE WITH ASARO,J RN [phone] COV[place],N     Studies: Ct Abdomen Pelvis Wo Contrast  02/22/2014   CLINICAL DATA:  rectal bleeding, weakness, fibromyalgia , morbid obesity  EXAM: CT ABDOMEN AND PELVIS WITHOUT CONTRAST  TECHNIQUE: Multidetector CT imaging of the abdomen and pelvis was performed following the standard protocol without intravenous contrast.  COMPARISON:  None.  FINDINGS: Sagittal images of the spine are unremarkable. There are streaky artifacts from patient's large body habitus.  Lung bases are unremarkable.  Unenhanced liver shows no biliary ductal dilatation. Markedly distended gallbladder without evidence of calcified gallstones. No pericholecystic fluid is suggested. Partially fatty replaced pancreas. The spleen and adrenal glands are unremarkable.  Kidneys are symmetrical in size. No nephrolithiasis. No hydronephrosis or hydroureter. No aortic aneurysm.  Abundant colonic stool.  No pericecal inflammation.  Abundant stool noted within distal sigmoid colon and rectum. The rectum measures at least 8 cm in diameter suspicious for fecal impaction. The uterus has a nodular appearance probable due to fibroids. The largest fibroid best visualized in sagittal image 73 measures about 4.4 cm. Correlation with pelvic ultrasound is recommended.  There is a suprapubic bladder catheter.  There is asymmetric thickening of left in left anal wall. Correlation with clinical exam is recommended to exclude inflammation or neoplastic process. Best seen in axial image 77.  No  small bowel obstruction.  No adenopathy.  No ascites or free air.  IMPRESSION: Main impression:  Abundant colonic stool. There is distension of the rectum with stool measures at least 8 cm in diameter suspicious for fecal impaction.  There is asymmetric thickening of left in left anal wall. Correlation with clinical exam is recommended to exclude inflammation or neoplastic process. Best seen in axial image 77.  1. Probable fibroid uterus. 2. Suprapubic bladder catheter. 3. Distended gallbladder without calcified gallstones. 4. No hydronephrosis or hydroureter. 5. No small bowel obstruction.   Electronically Signed   By: Natasha Mead M.D.   On: 02/22/2014 14:41   Dg Chest 2 View  02/22/2014   CLINICAL DATA:  Weakness, body pain  EXAM: CHEST  2 VIEW  COMPARISON:  None.  FINDINGS: Mediastinal contour is normal. The heart size is probably enlarged. The aorta is tortuous. Both lungs are clear. The visualized skeletal structures are unremarkable.  IMPRESSION: No active cardiopulmonary disease.   Electronically Signed   By: Sherian Rein M.D.   On: 02/22/2014 14:32   Ct Maxillofacial W/cm  02/23/2014   CLINICAL DATA:  Left parotid swelling.  Rule out underlying abscess.  EXAM: CT MAXILLOFACIAL WITH CONTRAST  TECHNIQUE: Multidetector CT imaging of the maxillofacial structures was performed with intravenous contrast. Multiplanar CT image reconstructions were also generated. A small metallic BB was placed on the right temple in order to reliably differentiate right from left.  CONTRAST:  OMNIPAQUE IOHEXOL 300 MG/ML  SOLN  COMPARISON:  None.  FINDINGS: Orbits are normal and symmetric. Paranasal sinuses well developed and well aerated without significant opacification or air-fluid levels. Mastoid air cells are clear. External auditory canals are normal and symmetric. There are several lymph nodes over the left parotid gland with the largest anterior to the external auditory canal measuring 9 mm by short axis. There  are a couple right parotid  gland lymph nodes over the superficial aspect. There are no significant inflammatory changes present. There is no underlying abscess. Remainder of the suprahyoid neck is unremarkable. Visualized infrahyoid neck is unremarkable. Remainder of the bony and soft tissue structures are within normal  IMPRESSION: Increased number of several mildly prominent lymph nodes over the left parotid gland without significant associated inflammatory change. No evidence of abscess. Findings may be seen in nonspecific parotitis which may be of infectious or autoimmune etiology. Neoplastic disease of the less likely. Recommend followup CT 6-8 weeks.   Electronically Signed   By: Elberta Fortisaniel  Boyle M.D.   On: 02/23/2014 18:23    Scheduled Meds: . buPROPion  300 mg Oral Daily  . cefTAZidime (FORTAZ)  IV  1 g Intravenous Q8H  . Chlorhexidine Gluconate Cloth  6 each Topical Q0600  . docusate sodium  100 mg Oral BID  . doxycycline  100 mg Oral Q12H  . fluticasone  1 spray Each Nare Daily  . gabapentin  300 mg Oral BID  . hydrocortisone cream   Topical TID  . insulin aspart  0-9 Units Subcutaneous TID WC  . insulin aspart protamine- aspart  34 Units Subcutaneous BID WC  . levothyroxine  300 mcg Oral QAC breakfast  . loratadine  10 mg Oral Daily  . lubiprostone  24 mcg Oral BID WC  . methadone  30 mg Oral 3 times per day  . mupirocin ointment  1 application Nasal BID  . oxybutynin  5 mg Oral TID  . polyvinyl alcohol  1 drop Both Eyes BID  . sodium chloride  3 mL Intravenous Q12H  . sucralfate  1 g Oral TID WC & HS  . tiZANidine  4 mg Oral QPM  . traZODone  100 mg Oral QHS  . venlafaxine XR  150 mg Oral Q breakfast   Continuous Infusions:   Principal Problem:   Urinary tract infection Active Problems:   Unspecified hereditary and idiopathic peripheral neuropathy   Chronic pain syndrome   HTN (hypertension)   Morbid obesity   Type II or unspecified type diabetes mellitus with peripheral  circulatory disorders, uncontrolled(250.72)   Depression   Unspecified hypothyroidism   Unspecified constipation   COPD (chronic obstructive pulmonary disease)   Anemia, iron deficiency   Scalp lesion   Enlarged parotid gland   Inadequate material resources   DNR (do not resuscitate)    Time spent: 25 min    [facility],Taja Pentland S  [facility] Pager [phone]*. If 7PM-7AM, please contact night-coverage at [email or web address], password [email or web address] 02/24/2014, 1:34 PM  LOS: 2 days

## 2014-02-24 NOTE — Progress Notes (Signed)
Pt c/o bladder pain, and burning. States this is relieved by pyridium. Pt requested that her dosage of pyridium  be increased. MD was paged and informed RN that pt was already on maximum dose. Pt was updated.

## 2014-02-25 DIAGNOSIS — E1159 Type 2 diabetes mellitus with other circulatory complications: Secondary | ICD-10-CM

## 2014-02-25 DIAGNOSIS — L989 Disorder of the skin and subcutaneous tissue, unspecified: Secondary | ICD-10-CM

## 2014-02-25 DIAGNOSIS — D649 Anemia, unspecified: Secondary | ICD-10-CM

## 2014-02-25 DIAGNOSIS — G894 Chronic pain syndrome: Secondary | ICD-10-CM

## 2014-02-25 DIAGNOSIS — N390 Urinary tract infection, site not specified: Secondary | ICD-10-CM

## 2014-02-25 LAB — CBC
HEMATOCRIT: 24 % — AB (ref 36.0–46.0)
Hemoglobin: 6.5 g/dL — CL (ref 12.0–15.0)
MCH: 21.1 pg — AB (ref 26.0–34.0)
MCHC: 27.1 g/dL — ABNORMAL LOW (ref 30.0–36.0)
MCV: 77.9 fL — AB (ref 78.0–100.0)
Platelets: 214 10*3/uL (ref 150–400)
RBC: 3.08 MIL/uL — AB (ref 3.87–5.11)
RDW: 16.8 % — ABNORMAL HIGH (ref 11.5–15.5)
WBC: 4.3 10*3/uL (ref 4.0–10.5)

## 2014-02-25 LAB — GLUCOSE, CAPILLARY
GLUCOSE-CAPILLARY: 217 mg/dL — AB (ref 70–99)
Glucose-Capillary: 151 mg/dL — ABNORMAL HIGH (ref 70–99)
Glucose-Capillary: 112 mg/dL — ABNORMAL HIGH (ref 70–99)
Glucose-Capillary: 187 mg/dL — ABNORMAL HIGH (ref 70–99)

## 2014-02-25 LAB — URINE CULTURE

## 2014-02-25 NOTE — Progress Notes (Signed)
PT Cancellation Note/ Screen  Patient Details Name: [name] MRN: [id number] DOB: 08/19/1961   Cancelled Treatment:    Reason Eval/Treat Not Completed: PT screened, no needs identified, will sign off Talked with pt at bedside.  She reports recently moving to [facility] facility after separating from her partner in [place].  Pt states she had aide when living at home to assist with ADLs and [facility] since her partner was also part caregiver.  Pt states she uses electric w/c for mobility and has no problems transferring.  Pt reports just feeling ill at this time and states no PT needs at present however aware to re-order if trouble transferring or increased weakness arises.  [facility]  PT to sign off.  Zenovia JarredKati Kenzel Ruesch, PT, DPT 02/25/2014 Pager: [phone]    Maida SaleLEMYRE,KATHrine E 02/25/2014, 10:11 AM

## 2014-02-25 NOTE — Progress Notes (Signed)
[facility] PROGRESS NOTE  Karen BarrioChristy Hlavacek AVW:[id number] DOB: 03/11/1961 DOA: 02/22/2014 PCP: Terald SleeperOBSON,MICHAEL GAVIN, MD  Assessment/Plan:  UTI  Patient has history of chronic UTI and has a suprapubic catheter in place.  on IV [place]. We'll follow the urine culture results.  Anemia  Does not want blood transfusion Secondary to GI bleed, patient has guaiac positive stools.  patient has refused  for endoscopy and colonoscopy, as she wants to wait for 2 weeks. GI has signed off.  Hb is 6.5 today, patient does not want blood transfusion.  Left parotid swelling CT maxillofacial was done which showed mildly prominent lymph nodes over the left parotid gland without associated inflammatory change, no evidence of abscess findings may be seen in nonspecific parotitis, which could be inflammatory versus infectious etiology. At this time no acute issue, can follow up ENT as outpatient.  Depression  Patient is severely depressed, has been crying throughout the interview. She also has lot of anger against her partner, and also is very unhappy with the care provided at the nursing facility. Psychiatry  consulted to adjust the patient's medications for depression. We'll continue patient's home regimen for depression including Effexor, Wellbutrin.    Chronic pain syndrome  Patient has history of Lyme disease, fibromyalgia and has been on methadone 10 mg by mouth 3 times a day along with oxycodone when necessary for pain. We'll continue the same medications.   Diabetes mellitus  Patient wants to eat regular food, as she feels that she doesn't have much quality of life. We'll continue the Novolin 70/30 along with sliding scale insulin.   Constipation  Patient says that she had last BM last night after she was given an enema, will continue the MiraLAX and enemas when necessary.   Abdominal pain  Patient has history of chronic UTI with suprapubic catheter in place. CT bone pelvis negative for acute  pathology. Likely combination of constipation and chronic UTI. We'll continue with the methadone and when necessary oxycodone.  Hypokalemia We'll replace the potassium Check BMP in the morning   Scalp wound  Most likely self-inflicted, as patient complains of skin tags hanging around for head. And she admits to scratching on the wound. We'll continue with the hydroxyzine when necessary for itching and also obtain a wound care consult.    Code Status: DNR Family Communication: *Discussed with patient in detail Disposition Plan: TBD   Consultants:  Palliative care  Procedures:  None  Antibiotics:  None  HPI/Subjective: Patient seen and examined, feels better today. Hb is 6.5 today. Again refusing blood transfusion.  Objective: Filed Vitals:   02/25/14 0720  BP: 123/77  Pulse: 81  Temp: 98.3 F (36.8 C)  Resp: 20    Intake/Output Summary (Last 24 hours) at 02/25/14 1336 Last data filed at 02/25/14 0700  Gross per 24 hour  Intake  700.5 ml  Output   2720 ml  Net -2019.5 ml   Filed Weights   02/22/14 1710  Weight: 127.914 kg (282 lb)    Exam:  Physical Exam: Head: Normocephalic, atraumatic.  Eyes: No signs of jaundice, EOMI Nose: Mucous membranes dry.  Throat: Oropharynx nonerythematous, no exudate appreciated.  Neck: supple,No deformities, masses, or tenderness noted. Lungs: Normal respiratory effort. B/L Clear to auscultation, no crackles or wheezes.  Heart: Regular RR. S1 and S2 normal  Abdomen: BS normoactive. Soft, Nondistended, non-tender.  Extremities: No pretibial edema, no erythema   Data Reviewed: Basic Metabolic Panel:  Recent Labs Lab 02/22/14 1350 02/23/14 0138 02/24/14 0530  NA 137 137 142  K 3.2* 3.3* 3.9  CL 91* 93* 99  CO2 35* 34* 33*  GLUCOSE 144* 192* 157*  BUN 11 11 9   CREATININE 0.86 0.87 0.79  CALCIUM 9.3 8.8 8.8   Liver Function Tests:  Recent Labs Lab 02/22/14 1350 02/23/14 0138 02/24/14 0530  AST 61* 36 20   ALT 62* 51* 37*  ALKPHOS 146* 125* 97  BILITOT 0.5 0.4 0.2*  PROT 7.8 6.7 6.4  ALBUMIN 3.3* 3.0* 2.7*    Recent Labs Lab 02/22/14 1350  LIPASE 13    Recent Labs Lab 02/22/14 1356  AMMONIA 32   CBC:  Recent Labs Lab 02/22/14 1350 02/22/14 1830 02/23/14 0138 02/23/14 0944 02/24/14 0530 02/25/14 0925  WBC 8.6  --  7.1  --  5.3 4.3  NEUTROABS 6.3  --   --   --   --   --   HGB 7.6* 7.1* 7.0* 7.1* 6.8* 6.5*  HCT 27.6* 26.4* 24.7* 26.1* 24.9* 24.0*  MCV 75.8*  --  76.0*  --  78.3 77.9*  PLT 281  --  259  --  218 214   Cardiac Enzymes:  Recent Labs Lab 02/22/14 1350  TROPONINI <0.30   BNP (last 3 results) No results found for this basename: PROBNP,  in the last 8760 hours CBG:  Recent Labs Lab 02/23/14 2136 02/24/14 0807 02/24/14 1207 02/24/14 1718 02/25/14 1206  GLUCAP 170* 132* 80 164* 112*    Recent Results (from the past 240 hour(s))  CULTURE, BLOOD (ROUTINE X 2)     Status: None   Collection Time    02/22/14  2:35 PM      Result Value Ref Range Status   Specimen Description BLOOD RIGHT HAND  5 ML IN [facility] BOTTLE   Final   Special Requests NONE   Final   Culture  Setup Time     Final   Value: 02/22/2014 16:40     Performed at [facility]   Culture     Final   Value:        BLOOD CULTURE RECEIVED NO GROWTH TO DATE CULTURE WILL BE HELD FOR 5 DAYS BEFORE ISSUING A FINAL NEGATIVE REPORT     Performed at [facility]   Report Status PENDING   Incomplete  CULTURE, BLOOD (ROUTINE X 2)     Status: None   Collection Time    02/22/14  2:55 PM      Result Value Ref Range Status   Specimen Description BLOOD LEFT FOREARM  5 ML IN [facility] BOTTLE   Final   Special Requests NONE   Final   Culture  Setup Time     Final   Value: 02/22/2014 23:44     Performed at [facility]   Culture     Final   Value:        BLOOD CULTURE RECEIVED NO GROWTH TO DATE CULTURE WILL BE HELD FOR 5 DAYS BEFORE ISSUING A FINAL NEGATIVE REPORT     Performed  at [facility]   Report Status PENDING   Incomplete  URINE CULTURE     Status: None   Collection Time    02/22/14  5:33 PM      Result Value Ref Range Status   Specimen Description URINE, CATHETERIZED   Final   Special Requests NONE   Final   Culture  Setup Time     Final   Value: 02/23/2014 01:43  Performed at [facility] Count     Final   Value: >=100,000 COLONIES/ML     Performed at [facility]   Culture     Final   Value: PSEUDOMONAS AERUGINOSA     Performed at [facility]   Report Status 02/25/2014 FINAL   Final   Organism ID, Bacteria PSEUDOMONAS AERUGINOSA   Final  MRSA PCR SCREENING     Status: Abnormal   Collection Time    02/23/14 12:11 AM      Result Value Ref Range Status   MRSA by PCR POSITIVE (*) NEGATIVE Final   Comment:            The GeneXpert MRSA Assay (FDA     approved for NASAL specimens     only), is one component of a     comprehensive MRSA colonization     surveillance program. It is not     intended to diagnose MRSA     infection nor to guide or     monitor treatment for     MRSA infections.     RESULT CALLED TO, READ BACK BY AND VERIFIED WITH:     SPOKE WITH ASARO,J RN [phone] COVINGTON,N     Studies: Ct Maxillofacial W/cm  02/23/2014   CLINICAL DATA:  Left parotid swelling.  Rule out underlying abscess.  EXAM: CT MAXILLOFACIAL WITH CONTRAST  TECHNIQUE: Multidetector CT imaging of the maxillofacial structures was performed with intravenous contrast. Multiplanar CT image reconstructions were also generated. A small metallic BB was placed on the right temple in order to reliably differentiate right from left.  CONTRAST:  OMNIPAQUE IOHEXOL 300 MG/ML  SOLN  COMPARISON:  None.  FINDINGS: Orbits are normal and symmetric. Paranasal sinuses well developed and well aerated without significant opacification or air-fluid levels. Mastoid air cells are clear. External auditory canals are normal and  symmetric. There are several lymph nodes over the left parotid gland with the largest anterior to the external auditory canal measuring 9 mm by short axis. There are a couple right parotid gland lymph nodes over the superficial aspect. There are no significant inflammatory changes present. There is no underlying abscess. Remainder of the suprahyoid neck is unremarkable. Visualized infrahyoid neck is unremarkable. Remainder of the bony and soft tissue structures are within normal  IMPRESSION: Increased number of several mildly prominent lymph nodes over the left parotid gland without significant associated inflammatory change. No evidence of abscess. Findings may be seen in nonspecific parotitis which may be of infectious or autoimmune etiology. Neoplastic disease of the less likely. Recommend followup CT 6-8 weeks.   Electronically Signed   By: Elberta Fortis M.D.   On: 02/23/2014 18:23    Scheduled Meds: . buPROPion  300 mg Oral Daily  . cefTAZidime (FORTAZ)  IV  1 g Intravenous Q8H  . Chlorhexidine Gluconate Cloth  6 each Topical Q0600  . docusate sodium  100 mg Oral BID  . doxycycline  100 mg Oral Q12H  . fluticasone  1 spray Each Nare Daily  . gabapentin  300 mg Oral BID  . hydrocortisone cream   Topical TID  . insulin aspart  0-9 Units Subcutaneous TID WC  . insulin aspart protamine- aspart  34 Units Subcutaneous BID WC  . levothyroxine  300 mcg Oral QAC breakfast  . loratadine  10 mg Oral Daily  . lubiprostone  24 mcg Oral BID WC  . methadone  30 mg Oral 3  times per day  . mupirocin ointment  1 application Nasal BID  . oxybutynin  5 mg Oral TID  . polyvinyl alcohol  1 drop Both Eyes BID  . sodium chloride  3 mL Intravenous Q12H  . sucralfate  1 g Oral TID WC & HS  . tiZANidine  4 mg Oral QPM  . traZODone  100 mg Oral QHS  . venlafaxine XR  75 mg Oral Q breakfast   Continuous Infusions:   Principal Problem:   Urinary tract infection Active Problems:   Unspecified hereditary and  idiopathic peripheral neuropathy   Chronic pain syndrome   HTN (hypertension)   Morbid obesity   Type II or unspecified type diabetes mellitus with peripheral circulatory disorders, uncontrolled(250.72)   Depression   Unspecified hypothyroidism   Unspecified constipation   COPD (chronic obstructive pulmonary disease)   Anemia, iron deficiency   Scalp lesion   Enlarged parotid gland   Inadequate material resources   DNR (do not resuscitate)    Time spent: 25 min    [facility]  [facility] Pager [phone]*. If 7PM-7AM, please contact night-coverage at [email or web address], password [facility] 02/25/2014, 1:36 PM  LOS: 3 days

## 2014-02-25 NOTE — Care Management Note (Addendum)
    Page 1 of 2   02/28/2014     11:40:46 AM   CARE MANAGEMENT NOTE 02/28/2014  Patient:  [name],[name]   Account Number:  [id number]  Date Initiated:  02/25/2014  Documentation initiated by:  Lanier ClamMAHABIR,Aldous Housel  Subjective/Objective Assessment:   52 Y/O F ADMITTED W/UTI.     Action/Plan:   FROM  SNF-[facility].   Anticipated DC Date:  02/26/2014   Anticipated DC Plan:  SKILLED NURSING FACILITY      DC Planning Services  CM consult      Choice offered to / List presented to:             Status of service:  In process, will continue to follow Medicare Important Message given?  YES (If response is "NO", the following Medicare IM given date fields will be blank) Date Medicare IM given:  02/26/2014 Date Additional Medicare IM given:  02/28/2014  Discharge Disposition:    Per UR Regulation:  Reviewed for med. necessity/level of care/duration of stay  If discussed at Long Length of Stay Meetings, dates discussed:    Comments:  02/28/14 Asha Grumbine RN,BSN NCM [phone] NOTED PATIENT DID NOT DRINK TOTAL AMOUNT OF GOLYTELY([name] COMMUNICATION), SO COLONOSCOPY/EGD CANCELLED.GI SIGNED OFF.2ND MEDICARE GIVEN ON 02/26/14,& 3RD MEDICARE IM GIVEN 02/28/14.MD UPDATED.D/C PLAN SNF.  02/26/14 Chariti Havel RN,BSN NCM [phone] FOR COLONOSCOPY ON THURSDAY.D/C PLAN SNF.  NOW PATIENT AGREEING TO COLONOSCOPY-INFORMED MD THAT THIS IS AN OTPT PROCEDURE.GI SIGNED OFF YESTERDAY.PMT SPOKE TO PATIENT SEE THEIR NOTE.PSYCH-OTPT SERVICES.MEDICARE IM GIVEN EXPLAINED PROCESS TO PATIENT.PT VOICED UNDERSTANDING.PATIENT WANTS NEW SNF.D/C PLAN SNF.  02/25/14 Kelly Ranieri RN,BSN NCM [phone] UTI-IV ABX,AWAITING SENSITIVITIES.PSYCH-MED MANAGEMENT.NOTED REFUSING MEDICAL SERVICES.D/C PLAN SNF.

## 2014-02-25 NOTE — Progress Notes (Signed)
ANTIBIOTIC CONSULT NOTE - INITIAL  Pharmacy Consult for Ceftazidime Indication: UTI  Allergies  Allergen Reactions  . Ibuprofen   . Levaquin [Levofloxacin In D5w]   . Penicillins   . Sulfa Antibiotics     Patient Measurements: Height: 5\' 5"  (165.1 cm) Weight: 282 lb (127.914 kg) IBW/kg (Calculated) : 57  Vital Signs: Temp: 98.3 F (36.8 C) (03/02 0720) Temp src: Oral (03/02 0720) BP: 123/77 mmHg (03/02 0720) Pulse Rate: 81 (03/02 0720)  Labs:  Recent Labs  02/22/14 1350  02/23/14 0138 02/23/14 0944 02/24/14 0530  WBC 8.6  --  7.1  --  5.3  HGB 7.6*  < > 7.0* 7.1* 6.8*  PLT 281  --  259  --  218  CREATININE 0.86  --  0.87  --  0.79  < > = values in this interval not displayed. Estimated Creatinine Clearance: 110.9 ml/min (by C-G formula based on Cr of 0.79).  Microbiology: Recent Results (from the past 720 hour(s))  CULTURE, BLOOD (ROUTINE X 2)     Status: None   Collection Time    02/22/14  2:35 PM      Result Value Ref Range Status   Specimen Description BLOOD RIGHT HAND  [facility]   Final   Special Requests NONE   Final   Culture  Setup Time     Final   Value: 02/22/2014 16:40     [facility]   Culture     Final   Value:        BLOOD CULTURE RECEIVED NO GROWTH TO DATE CULTURE WILL BE HELD FOR 5 DAYS BEFORE ISSUING A FINAL NEGATIVE REPORT     [facility]   Report Status PENDING   Incomplete  CULTURE, BLOOD (ROUTINE X 2)     Status: None   Collection Time    02/22/14  2:55 PM      Result Value Ref Range Status   Specimen Description BLOOD LEFT FOREARM  [facility]   Final   Special Requests NONE   Final   Culture  Setup Time     Final   Value: 02/22/2014 23:44     [facility]   Culture     Final   Value:        BLOOD CULTURE RECEIVED NO GROWTH TO DATE CULTURE WILL BE HELD FOR 5 DAYS BEFORE ISSUING A FINAL NEGATIVE REPORT     [facility]   Report  Status PENDING   Incomplete  URINE CULTURE     Status: None   Collection Time    02/22/14  5:33 PM      Result Value Ref Range Status   Specimen Description URINE, CATHETERIZED   Final   Special Requests NONE   Final   Culture  Setup Time     Final   Value: 02/23/2014 01:43     [facility]   Colony Count     Final   Value: >=100,000 COLONIES/ML     [facility] at [facility]   Culture     Final   Value: PSEUDOMONAS AERUGINOSA     [facility]formed at [facility]   Report Status PENDING   Incomplete  MRSA PCR SCREENING     Status: Abnormal   Collection Time    02/23/14 12:11 AM      Result Value Ref Range Status  MRSA by PCR POSITIVE (*) NEGATIVE Final   Comment:            The GeneXpert MRSA Assay (FDA     approved for NASAL specimens     only), is one component of a     comprehensive MRSA colonization     surveillance program. It is not     intended to diagnose MRSA     infection nor to guide or     monitor treatment for     MRSA infections.     RESULT CALLED TO, READ BACK BY AND VERIFIED WITH:     SPOKE WITH ASARO,J RN [phone] COVINGTON,N    Medical History: Past Medical History  Diagnosis Date  . Diabetes mellitus without complication   . Chronic pain   . Morbid obesity   . OSA (obstructive sleep apnea)   . Generalized weakness   . Fibromyalgia   . Lyme disease   . Anemia   . History of blood transfusion     pt states she has had 5 blood transfusions  . Sleep disorder   . UTI (lower urinary tract infection) 02/22/2014    Anti-infectives:  PTA, 2/27 >> Ceftazidime >> PTA, 2/27 >> Doxycycline >>     Assessment: 44 yoF admitted 2/27 from SNF with fever and weakness.  PMH includes Lyme disease, chronic pain, chronic UTI, bedbound with suprapubic catheter PTA.  She was diagnosed with Pseudomonas UTI and was started on ceftazidime and doxycycline (started 2/24) prior to admission at facility.  Pharmacy is consulted to dose  ceftazidime.   D# 7 Total Ceftazidime for Pseudomonas UTI  D#7/10 Doxycycline per outpatient prescription   Noted Penicillin allergy (unknown rxn), but pt has been tolerating ceftazidime   Renal: SCr 0.86, CrCl > 100 ml/min (CG)  WBC WNL  AF  Micro 2/27 Blood x 2: NGTD 2/27 Urine > 100k Pseudomonas - Pending     Goal of Therapy:  Ceftazidime per renal function   Plan:   Ceftazidime 1000mg  IV q8h, f/u final culture results (still pending)  Doxycycline 100 mg BID - MD Can you specify indication/continued need for doxycyline, or add 10 day stop date (March 5th)  Onell Mcmath, Loma Messing PharmD Pager #: [phone] 8:55 AM 02/25/2014

## 2014-02-25 NOTE — Progress Notes (Signed)
I discussed performing the colonoscopy tomorrow, which is the agreed upon time.  Now she states that she wants more time as she is not ready.  She wants to have the procedure in 1-2 weeks.  The patient reports that her current infection is worse than it was in the past and this concerns her with relation to the EGD/colonoscopy.  I told her that her current medical condition does not preclude a work up, but she is not ready personally.  She reports in the past that she had many medical issues that always prevented her from undergoing the endoscopic work up, however, I believe the delays were stemming from the patient.  It is clear to me that if we wait another 1-2 weeks she will find another reason not to undergo the procedures.  At this time I cannot offer any further assistance.  Signing off.
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
## 2014-02-26 LAB — CBC
HCT: 25.3 % — ABNORMAL LOW (ref 36.0–46.0)
HEMOGLOBIN: 6.8 g/dL — AB (ref 12.0–15.0)
MCH: 20.8 pg — ABNORMAL LOW (ref 26.0–34.0)
MCHC: 26.9 g/dL — ABNORMAL LOW (ref 30.0–36.0)
MCV: 77.4 fL — AB (ref 78.0–100.0)
Platelets: 258 10*3/uL (ref 150–400)
RBC: 3.27 MIL/uL — AB (ref 3.87–5.11)
RDW: 16.6 % — ABNORMAL HIGH (ref 11.5–15.5)
WBC: 6.8 10*3/uL (ref 4.0–10.5)

## 2014-02-26 MED ORDER — PEG 3350-KCL-NA BICARB-NACL 420 G PO SOLR
4000.0000 mL | Freq: Once | ORAL | Status: AC
  Administered 2014-02-27: 4000 mL via ORAL

## 2014-02-26 MED ORDER — DOXYCYCLINE HYCLATE 100 MG PO TABS
100.0000 mg | ORAL_TABLET | Freq: Two times a day (BID) | ORAL | Status: AC
  Administered 2014-02-26 – 2014-02-28 (×5): 100 mg via ORAL
  Filled 2014-02-26 (×5): qty 1

## 2014-02-26 MED ORDER — CIPROFLOXACIN HCL 500 MG PO TABS
500.0000 mg | ORAL_TABLET | Freq: Two times a day (BID) | ORAL | Status: DC
  Administered 2014-02-26: 500 mg via ORAL
  Filled 2014-02-26 (×5): qty 1

## 2014-02-26 MED ORDER — SODIUM CHLORIDE 0.9 % IV SOLN
INTRAVENOUS | Status: DC
  Administered 2014-02-26 – 2014-03-02 (×3): via INTRAVENOUS
  Administered 2014-03-05: 1 mL via INTRAVENOUS

## 2014-02-26 NOTE — Progress Notes (Signed)
RN paged this NP because is refusing cipro because she is "immune" to it. RN has explained to her the importance of taking this med. I asked RN to explain to pt that urine culture showed sensitivity to Cipro and that is why it was chosen.  Jimmye NormanKaren Kirby-Graham, NP [facility]

## 2014-02-26 NOTE — Progress Notes (Signed)
Patient refused oral ciprofloxacin because she said it made her immune with that and only IV antibiotic can work for her infection.I told her the importance of the antibiotic. Donnamarie PoagK. Kirby NP was made aware.

## 2014-02-26 NOTE — Progress Notes (Signed)
CSW met with patient in conjunction with RN Lennie Hummer. Patient is upset at the prospect of going back to [facility]. CSW explained that CSW will fax patient out but that it is going to be difficult due to lack of funds. Patient reports she has been having difficulty getting her social security straightened out so she hasnt paid [facility] her social security for the past several months. CSW explained that will be another barrier. Patient became frustrated in the fact that she wants either an assisted living or an independent apartment but CSW discussed her limitations in her physical capabilities. Patient is aware of these but having a difficult time accepting them. Patient is agreeable to returning to [facility] but wants her colonoscopy first. She wants CSW to speak with social worker at [employer] [employer] to explain her wishes. CSW states that she will do same. CSW called [facility] to try and figure out what patients passar number is as CSW can not look it up. CSW has left messages for both admissions and social work at [employer].  Huel Centola C. [place] MSW, [place]

## 2014-02-26 NOTE — Progress Notes (Signed)
Palliative Medicine Team Progress Note  Case discussed with Dr. Lama, patient refusSharl Walls medical interventions. Psychiatry has evaluated and is following patient-unclear if patient is refusing treatments as a form of self harm due to depression and Cluster B personality disorder. Her Hb is life threatening at 6.8 and trending down. She is clear on her goals of care and expresses them freely - she is not consenting to treatment despite how irrational this may seem but her own perception of her QOL is very poor. Her goal is to stay out of SNF and she is very clear on that as well-she reports prior use of Hospice services but this has been unconfirmed. Primary psychiatric issues currently and a severe undefined anemia-please re-consult us if there is a need for additional palliative care services.  Karen MaltaElizabeth Khayden Herzberg, DO Palliative Medicine

## 2014-02-26 NOTE — Progress Notes (Signed)
[facility] PROGRESS NOTE  [name] ZOX:[id number] DOB: [birth year] DOA: 02/22/2014 PCP: Terald Sleeper, MD  Assessment/Plan:  UTI  Patient has history of chronic UTI and has a suprapubic catheter in place.  Was started on IV Fortaz. Urine culture came back and pseudomonas is sensitive to Cipro.Will discontinue Fortaz and start Cipro.  Anemia  Does not want blood transfusion Secondary to GI bleed, patient has guaiac positive stools.  Earlier patient had refused  for endoscopy and colonoscopy, but now wants to have colonoscopy, called and discussed with GI. They plan to do the colonoscopy on Thursday.  Hb is 6.8 today, patient does not want blood transfusion.  Left parotid swelling CT maxillofacial was done which showed mildly prominent lymph nodes over the left parotid gland without associated inflammatory change, no evidence of abscess findings may be seen in nonspecific parotitis, which could be inflammatory versus infectious etiology. At this time no acute issue, can follow up ENT as outpatient.  Depression  Patient is severely depressed, has been crying throughout the interview. She also has lot of anger against her partner, and also is very unhappy with the care provided at the nursing facility. Psychiatry  consulted to adjust the patient's medications for depression. We'll continue patient's home regimen for depression including Effexor, Wellbutrin.    Chronic pain syndrome  Patient has history of Lyme disease, fibromyalgia and has been on methadone 10 mg by mouth 3 times a day along with oxycodone when necessary for pain. We'll continue the same medications.   Diabetes mellitus  Patient wants to eat regular food, as she feels that she doesn't have much quality of life. We'll continue the Novolin 70/30 along with sliding scale insulin.   Constipation  Patient says that she had last BM last night after she was given an enema, will continue the MiraLAX and enemas  when necessary.   Abdominal pain  Patient has history of chronic UTI with suprapubic catheter in place. CT bone pelvis negative for acute pathology. Likely combination of constipation and chronic UTI. We'll continue with the methadone and when necessary oxycodone.  Hypokalemia We'll replace the potassium Check BMP in the morning   Scalp wound  Most likely self-inflicted, as patient complains of skin tags hanging around for head. And she admits to scratching on the wound. We'll continue with the hydroxyzine when necessary for itching. Patient needs to follow up Dermatology as outpatient.   Code Status: DNR Family Communication: *Discussed with patient in detail Disposition Plan: TBD   Consultants:  Palliative care  Procedures:  None  Antibiotics:  None  HPI/Subjective: Patient seen and examined, feels better today. Hb is 6.8 today. Again refusing blood transfusion.  Objective: Filed Vitals:   02/26/14 0614  BP: 122/67  Pulse: 92  Temp: 98.9 F (37.2 C)  Resp: 18    Intake/Output Summary (Last 24 hours) at 02/26/14 1358 Last data filed at 02/26/14 0700  Gross per 24 hour  Intake    360 ml  Output   1885 ml  Net  -1525 ml   Filed Weights   02/22/14 1710  Weight: 127.914 kg (282 lb)    Exam:  Physical Exam: Head: Normocephalic, atraumatic.  Eyes: No signs of jaundice, EOMI Nose: Mucous membranes dry.  Throat: Oropharynx nonerythematous, no exudate appreciated.  Neck: supple,No deformities, masses, or tenderness noted. Lungs: Normal respiratory effort. B/L Clear to auscultation, no crackles or wheezes.  Heart: Regular RR. S1 and S2 normal  Abdomen: BS normoactive. Soft, Nondistended, non-tender.  Extremities:  No pretibial edema, no erythema   Data Reviewed: Basic Metabolic Panel:  Recent Labs Lab 02/22/14 1350 02/23/14 0138 02/24/14 0530  NA 137 137 142  K 3.2* 3.3* 3.9  CL 91* 93* 99  CO2 35* 34* 33*  GLUCOSE 144* 192* 157*  BUN 11 11 9    CREATININE 0.86 0.87 0.79  CALCIUM 9.3 8.8 8.8   Liver Function Tests:  Recent Labs Lab 02/22/14 1350 02/23/14 0138 02/24/14 0530  AST 61* 36 20  ALT 62* 51* 37*  ALKPHOS 146* 125* 97  BILITOT 0.5 0.4 0.2*  PROT 7.8 6.7 6.4  ALBUMIN 3.3* 3.0* 2.7*    Recent Labs Lab 02/22/14 1350  LIPASE 13    Recent Labs Lab 02/22/14 1356  AMMONIA 32   CBC:  Recent Labs Lab 02/22/14 1350  02/23/14 0138 02/23/14 0944 02/24/14 0530 02/25/14 0925 02/26/14 0540  WBC 8.6  --  7.1  --  5.3 4.3 6.8  NEUTROABS 6.3  --   --   --   --   --   --   HGB 7.6*  < > 7.0* 7.1* 6.8* 6.5* 6.8*  HCT 27.6*  < > 24.7* 26.1* 24.9* 24.0* 25.3*  MCV 75.8*  --  76.0*  --  78.3 77.9* 77.4*  PLT 281  --  259  --  218 214 258  < > = values in this interval not displayed. Cardiac Enzymes:  Recent Labs Lab 02/22/14 1350  TROPONINI <0.30   BNP (last 3 results) No results found for this basename: PROBNP,  in the last 8760 hours CBG:  Recent Labs Lab 02/24/14 1718 02/24/14 2132 02/25/14 0751 02/25/14 1206 02/25/14 1651  GLUCAP 164* 151* 217* 112* 187*    Recent Results (from the past 240 hour(s))  CULTURE, BLOOD (ROUTINE X 2)     Status: None   Collection Time    02/22/14  2:35 PM      Result Value Ref Range Status   Specimen Description BLOOD RIGHT HAND  5 [facility]   Final   Special Requests NONE   Final   Culture  Setup Time     Final   Value: 02/22/2014 16:40     Performed at [facility]   Culture     Final   Value:        BLOOD CULTURE RECEIVED NO GROWTH TO DATE CULTURE WILL BE HELD FOR 5 DAYS BEFORE ISSUING A FINAL NEGATIVE REPORT     Performed at [facility]   Report Status PENDING   Incomplete  CULTURE, BLOOD (ROUTINE X 2)     Status: None   Collection Time    02/22/14  2:55 PM      Result Value Ref Range Status   Specimen Description BLOOD LEFT FOREARM  5 ML [facility]   Final   Special Requests NONE   Final   Culture  Setup Time     Final    Value: 02/22/2014 23:44     Performed at [facility]   Culture     Final   Value:        BLOOD CULTURE RECEIVED NO GROWTH TO DATE CULTURE WILL BE HELD FOR 5 DAYS BEFORE ISSUING A FINAL NEGATIVE REPORT     Performed at [facility]   Report Status PENDING   Incomplete  URINE CULTURE     Status: None   Collection Time    02/22/14  5:33 PM  Result Value Ref Range Status   Specimen Description URINE, CATHETERIZED   Final   Special Requests NONE   Final   Culture  Setup Time     Final   Value: 02/23/2014 01:43     Performed at [facility]   Colony Count     Final   Value: >=100,000 COLONIES/ML     Performed at [facility]   Culture     Final   Value: PSEUDOMONAS AERUGINOSA     Performed at [facility]   Report Status 02/25/2014 FINAL   Final   Organism ID, Bacteria PSEUDOMONAS AERUGINOSA   Final  MRSA PCR SCREENING     Status: Abnormal   Collection Time    02/23/14 12:11 AM      Result Value Ref Range Status   MRSA by PCR POSITIVE (*) NEGATIVE Final   Comment:            The GeneXpert MRSA Assay (FDA     approved for NASAL specimens     only), is one component of a     comprehensive MRSA colonization     surveillance program. It is not     intended to diagnose MRSA     infection nor to guide or     monitor treatment for     MRSA infections.     RESULT CALLED TO, READ BACK BY AND VERIFIED WITH:     SPOKE WITH ASARO,J RN [phone] [place],N     Studies: No results found.  Scheduled Meds: . buPROPion  300 mg Oral Daily  . Chlorhexidine Gluconate Cloth  6 each Topical Q0600  . ciprofloxacin  500 mg Oral BID  . docusate sodium  100 mg Oral BID  . doxycycline  100 mg Oral Q12H  . fluticasone  1 spray Each Nare Daily  . gabapentin  300 mg Oral BID  . hydrocortisone cream   Topical TID  . insulin aspart  0-9 Units Subcutaneous TID WC  . insulin aspart protamine- aspart  34 Units Subcutaneous BID WC  . levothyroxine  300  mcg Oral QAC breakfast  . loratadine  10 mg Oral Daily  . lubiprostone  24 mcg Oral BID WC  . methadone  30 mg Oral 3 times per day  . mupirocin ointment  1 application Nasal BID  . oxybutynin  5 mg Oral TID  . polyvinyl alcohol  1 drop Both Eyes BID  . sodium chloride  3 mL Intravenous Q12H  . sucralfate  1 g Oral TID WC & HS  . tiZANidine  4 mg Oral QPM  . traZODone  100 mg Oral QHS  . venlafaxine XR  75 mg Oral Q breakfast   Continuous Infusions:   Principal Problem:   Urinary tract infection Active Problems:   Unspecified hereditary and idiopathic peripheral neuropathy   Chronic pain syndrome   HTN (hypertension)   Morbid obesity   Type II or unspecified type diabetes mellitus with peripheral circulatory disorders, uncontrolled(250.72)   Depression   Unspecified hypothyroidism   Unspecified constipation   COPD (chronic obstructive pulmonary disease)   Anemia, iron deficiency   Scalp lesion   Enlarged parotid gland   Inadequate material resources   DNR (do not resuscitate)    Time spent: 25 min    [facility]  [facility] Pager [phone]*. If 7PM-7AM, please contact night-coverage at [email or web address], password [facility] 02/26/2014, 1:58 PM  LOS: 4 days

## 2014-02-26 NOTE — Progress Notes (Addendum)
Pharmacy: Brief Antibiotic note    Patient was on IV Ceftazidime for UTI Prior to admission.  Started on 2/24.  Today is D#8 total antibiotics.  Her most recent Urine culture from 2/27 grew > 100k Colonies of psuedomonas sensitive to: Cefepime, Ceftazidime, Cipro, Gent, Primaxin, Zosyn, Tobramycin  Spoke with Dr. Sharl MaLama with plans to finish out 10 day course of antibiotics with PO Cipro as she is able to tolerate regular diet and other PO medications.    Also to complete course of doxycycline for scalp wound x 10 days  Allergy to Levaquin noted - Per patient it made her nauseas.  She has taken PO cipro in the past with no reaction.  She states it does not work well for her but given culture sensitives we will try it.  MD aware of warning with use of cipro w/ tizanidine (Potential for hypotension/dizziness) - Will monitor closely and see how she tolerates.   Plan 1.) Ciprofloxacin 500 mg PO BID x 6 doses  2.) Doxycycline to stop after march 5th 3.) Discontinue IV ceftazidime     Anjelika Ausburn, Loma MessingMary Patricia PharmD Pager #: [phone] 10:35 AM 02/26/2014
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
## 2014-02-27 LAB — CBC
HEMATOCRIT: 24 % — AB (ref 36.0–46.0)
HEMOGLOBIN: 6.5 g/dL — AB (ref 12.0–15.0)
MCH: 20.9 pg — ABNORMAL LOW (ref 26.0–34.0)
MCHC: 27.1 g/dL — ABNORMAL LOW (ref 30.0–36.0)
MCV: 77.2 fL — ABNORMAL LOW (ref 78.0–100.0)
Platelets: 221 10*3/uL (ref 150–400)
RBC: 3.11 MIL/uL — ABNORMAL LOW (ref 3.87–5.11)
RDW: 16.9 % — ABNORMAL HIGH (ref 11.5–15.5)
WBC: 6.6 10*3/uL (ref 4.0–10.5)

## 2014-02-27 MED ORDER — DEXTROSE 5 % IV SOLN
1.0000 g | Freq: Three times a day (TID) | INTRAVENOUS | Status: DC
  Administered 2014-02-27 – 2014-03-05 (×19): 1 g via INTRAVENOUS
  Filled 2014-02-27 (×21): qty 1

## 2014-02-27 MED ORDER — BISACODYL 5 MG PO TBEC
20.0000 mg | DELAYED_RELEASE_TABLET | Freq: Once | ORAL | Status: AC
  Administered 2014-02-27: 20 mg via ORAL
  Filled 2014-02-27: qty 4
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
## 2014-02-27 NOTE — Progress Notes (Signed)
Clinical Social Work  CSW went to room to meet with patient. Patient reports "I'm out of it" and asked CSW to come back tomorrow. CSW will continue to follow.  FincastleHolly Bernarr Walls, [place] [phone]

## 2014-02-27 NOTE — Progress Notes (Signed)
CSW met with patient. Patient had been refaxed out. She has a bed offer for a private room in [place]. She would like to accept that. Her medicare time has started over and her goal is to rehab and get to an assisted living. She thanked CSW profusely for finding her a place.  Kashauna Celmer C. Bethune MSW, [place]

## 2014-02-27 NOTE — Progress Notes (Signed)
[facility] PROGRESS NOTE  [name] ZOX:[id number] DOB: [birth year] DOA: 02/22/2014 PCP: Terald Sleeper, MD  Assessment/Plan:  UTI  Patient has history of chronic UTI and has a suprapubic catheter in place.  Was started on IV Fortaz. Urine culture came back and pseudomonas is sensitive to Cipro.but patient is refusing to take oral ciprofloxacin, hence we will continue with IV fortaz. Discussed with pharmacy.   Anemia  Does not want blood transfusion Secondary to GI bleed, patient has guaiac positive stools.  Earlier patient had refused  for endoscopy and colonoscopy, but now wants to have colonoscopy, called and discussed with GI. They plan to do the colonoscopy on Thursday.  Hb is 6.5 today, patient does not want blood transfusion.  Left parotid swelling CT maxillofacial was done which showed mildly prominent lymph nodes over the left parotid gland without associated inflammatory change, no evidence of abscess findings may be seen in nonspecific parotitis, which could be inflammatory versus infectious etiology. At this time no acute issue, can follow up ENT as outpatient.  Depression  Patient is severely depressed, has been crying throughout the interview. She also has lot of anger against her partner, and also is very unhappy with the care provided at the nursing facility. Psychiatry  consulted to adjust the patient's medications for depression. We'll continue patient's home regimen for depression including Effexor, Wellbutrin.    Chronic pain syndrome  Patient has history of Lyme disease, fibromyalgia and has been on methadone 10 mg by mouth 3 times a day along with oxycodone when necessary for pain. We'll continue the same medications.   Diabetes mellitus  Patient wants to eat regular food, as she feels that she doesn't have much quality of life. We'll continue the Novolin 70/30 along with sliding scale insulin.   Constipation  Patient says that she had last BM last  night after she was given an enema, will continue the MiraLAX and enemas when necessary.   Abdominal pain  Patient has history of chronic UTI with suprapubic catheter in place. CT bone pelvis negative for acute pathology. Likely combination of constipation and chronic UTI. We'll continue with the methadone and when necessary oxycodone.  Hypokalemia We'll replace the potassium Check BMP in the morning   Scalp wound  Most likely self-inflicted, as patient complains of skin tags hanging around for head. And she admits to scratching on the wound. We'll continue with the hydroxyzine when necessary for itching. Patient needs to follow up Dermatology as outpatient.   Code Status: DNR Family Communication: *Discussed with patient in detail Disposition Plan: SNF possibly on Friday after colonoscopy.    Consultants:  Palliative care  Gastroenterology.   Procedures:  Colonoscopy planned for 3/5  Antibiotics:  None  HPI/Subjective: Patient seen and examined, feels better today. Hb is 6.5 today. Again refusing blood transfusion.  Objective: Filed Vitals:   02/27/14 1430  BP: 112/67  Pulse: 77  Temp: 98.5 F (36.9 C)  Resp: 20    Intake/Output Summary (Last 24 hours) at 02/27/14 1556 Last data filed at 02/27/14 1500  Gross per 24 hour  Intake   1644 ml  Output   4825 ml  Net  -3181 ml   Filed Weights   02/22/14 1710  Weight: 127.914 kg (282 lb)    Exam:  Physical Exam: Head: Normocephalic, atraumatic.  Eyes: No signs of jaundice, EOMI Nose: Mucous membranes dry.  Throat: Oropharynx nonerythematous, no exudate appreciated.  Neck: supple,No deformities, masses, or tenderness noted. Lungs: Normal respiratory effort. B/L  Clear to auscultation, no crackles or wheezes.  Heart: Regular RR. S1 and S2 normal  Abdomen: BS normoactive. Soft, Nondistended, non-tender.  Extremities: No pretibial edema, no erythema   Data Reviewed: Basic Metabolic Panel:  Recent  Labs Lab 02/22/14 1350 02/23/14 0138 02/24/14 0530  NA 137 137 142  K 3.2* 3.3* 3.9  CL 91* 93* 99  CO2 35* 34* 33*  GLUCOSE 144* 192* 157*  BUN 11 11 9   CREATININE 0.86 0.87 0.79  CALCIUM 9.3 8.8 8.8   Liver Function Tests:  Recent Labs Lab 02/22/14 1350 02/23/14 0138 02/24/14 0530  AST 61* 36 20  ALT 62* 51* 37*  ALKPHOS 146* 125* 97  BILITOT 0.5 0.4 0.2*  PROT 7.8 6.7 6.4  ALBUMIN 3.3* 3.0* 2.7*    Recent Labs Lab 02/22/14 1350  LIPASE 13    Recent Labs Lab 02/22/14 1356  AMMONIA 32   CBC:  Recent Labs Lab 02/22/14 1350  02/23/14 0138 02/23/14 0944 02/24/14 0530 02/25/14 0925 02/26/14 0540 02/27/14 0525  WBC 8.6  --  7.1  --  5.3 4.3 6.8 6.6  NEUTROABS 6.3  --   --   --   --   --   --   --   HGB 7.6*  < > 7.0* 7.1* 6.8* 6.5* 6.8* 6.5*  HCT 27.6*  < > 24.7* 26.1* 24.9* 24.0* 25.3* 24.0*  MCV 75.8*  --  76.0*  --  78.3 77.9* 77.4* 77.2*  PLT 281  --  259  --  218 214 258 221  < > = values in this interval not displayed. Cardiac Enzymes:  Recent Labs Lab 02/22/14 1350  TROPONINI <0.30   BNP (last 3 results) No results found for this basename: PROBNP,  in the last 8760 hours CBG:  Recent Labs Lab 02/24/14 1718 02/24/14 2132 02/25/14 0751 02/25/14 1206 02/25/14 1651  GLUCAP 164* 151* 217* 112* 187*    Recent Results (from the past 240 hour(s))  CULTURE, BLOOD (ROUTINE X 2)     Status: None   Collection Time    02/22/14  2:35 PM      Result Value Ref Range Status   Specimen Description BLOOD RIGHT HAND  5 ML IN [facility] BOTTLE   Final   Special Requests NONE   Final   Culture  Setup Time     Final   Value: 02/22/2014 16:40     Performed at [facility]   Culture     Final   Value:        BLOOD CULTURE RECEIVED NO GROWTH TO DATE CULTURE WILL BE HELD FOR 5 DAYS BEFORE ISSUING A FINAL NEGATIVE REPORT     Performed at [facility]   Report Status PENDING   Incomplete  CULTURE, BLOOD (ROUTINE X 2)     Status: None    Collection Time    02/22/14  2:55 PM      Result Value Ref Range Status   Specimen Description BLOOD LEFT FOREARM  5 ML IN [facility] BOTTLE   Final   Special Requests NONE   Final   Culture  Setup Time     Final   Value: 02/22/2014 23:44     Performed at [facility]   Culture     Final   Value:        BLOOD CULTURE RECEIVED NO GROWTH TO DATE CULTURE WILL BE HELD FOR 5 DAYS BEFORE ISSUING A FINAL NEGATIVE REPORT  Performed at [facility]   Report Status PENDING   Incomplete  URINE CULTURE     Status: None   Collection Time    02/22/14  5:33 PM      Result Value Ref Range Status   Specimen Description URINE, CATHETERIZED   Final   Special Requests NONE   Final   Culture  Setup Time     Final   Value: 02/23/2014 01:43     Performed at [facility] Count     Final   Value: >=100,000 COLONIES/ML     Performed at [facility]   Culture     Final   Value: PSEUDOMONAS AERUGINOSA     Performed at [facility]   Report Status 02/25/2014 FINAL   Final   Organism ID, Bacteria PSEUDOMONAS AERUGINOSA   Final  MRSA PCR SCREENING     Status: Abnormal   Collection Time    02/23/14 12:11 AM      Result Value Ref Range Status   MRSA by PCR POSITIVE (*) NEGATIVE Final   Comment:            The GeneXpert MRSA Assay (FDA     approved for NASAL specimens     only), is one component of a     comprehensive MRSA colonization     surveillance program. It is not     intended to diagnose MRSA     infection nor to guide or     monitor treatment for     MRSA infections.     RESULT CALLED TO, READ BACK BY AND VERIFIED WITH:     SPOKE WITH ASARO,J RN [phone] COV[place],N     Studies: No results found.  Scheduled Meds: . buPROPion  300 mg Oral Daily  . cefTAZidime (FORTAZ)  IV  1 g Intravenous 3 times per day  . Chlorhexidine Gluconate Cloth  6 each Topical Q0600  . docusate sodium  100 mg Oral BID  . doxycycline  100 mg Oral Q12H  .  fluticasone  1 spray Each Nare Daily  . gabapentin  300 mg Oral BID  . hydrocortisone cream   Topical TID  . insulin aspart  0-9 Units Subcutaneous TID WC  . insulin aspart protamine- aspart  34 Units Subcutaneous BID WC  . levothyroxine  300 mcg Oral QAC breakfast  . loratadine  10 mg Oral Daily  . lubiprostone  24 mcg Oral BID WC  . methadone  30 mg Oral 3 times per day  . mupirocin ointment  1 application Nasal BID  . oxybutynin  5 mg Oral TID  . polyethylene glycol-electrolytes  4,000 mL Oral Once  . polyvinyl alcohol  1 drop Both Eyes BID  . sodium chloride  3 mL Intravenous Q12H  . sucralfate  1 g Oral TID WC & HS  . tiZANidine  4 mg Oral QPM  . traZODone  100 mg Oral QHS  . venlafaxine XR  75 mg Oral Q breakfast   Continuous Infusions: . sodium chloride 20 mL/hr at 02/26/14 2019    Principal Problem:   Urinary tract infection Active Problems:   Unspecified hereditary and idiopathic peripheral neuropathy   Chronic pain syndrome   HTN (hypertension)   Morbid obesity   Type II or unspecified type diabetes mellitus with peripheral circulatory disorders, uncontrolled(250.72)   Depression   Unspecified hypothyroidism   Unspecified constipation   COPD (chronic obstructive pulmonary disease)  Anemia, iron deficiency   Scalp lesion   Enlarged parotid gland   Inadequate material resources   DNR (do not resuscitate)    Time spent: 25 min    Jameisha Stofko  [facility] Pager [phone]. If 7PM-7AM, please contact night-coverage at [email or web address], password [email or web address] 02/27/2014, 3:56 PM  LOS: 5 days

## 2014-02-28 ENCOUNTER — Encounter (HOSPITAL_COMMUNITY)
Admission: EM | Disposition: A | Discharge status: Skilled Nursing Facility | Payer: Self-pay | Source: Home / Self Care | Attending: Internal Medicine

## 2014-02-28 LAB — GLUCOSE, CAPILLARY
GLUCOSE-CAPILLARY: 164 mg/dL — AB (ref 70–99)
GLUCOSE-CAPILLARY: 134 mg/dL — AB (ref 70–99)
GLUCOSE-CAPILLARY: 107 mg/dL — AB (ref 70–99)
GLUCOSE-CAPILLARY: 138 mg/dL — AB (ref 70–99)
GLUCOSE-CAPILLARY: 199 mg/dL — AB (ref 70–99)
GLUCOSE-CAPILLARY: 197 mg/dL — AB (ref 70–99)
GLUCOSE-CAPILLARY: 72 mg/dL (ref 70–99)
GLUCOSE-CAPILLARY: 145 mg/dL — AB (ref 70–99)
GLUCOSE-CAPILLARY: 172 mg/dL — AB (ref 70–99)
Glucose-Capillary: 230 mg/dL — ABNORMAL HIGH (ref 70–99)
Glucose-Capillary: 103 mg/dL — ABNORMAL HIGH (ref 70–99)
Glucose-Capillary: 70 mg/dL (ref 70–99)
Glucose-Capillary: 96 mg/dL (ref 70–99)

## 2014-02-28 LAB — CBC
HCT: 24.8 % — ABNORMAL LOW (ref 36.0–46.0)
HEMOGLOBIN: 6.6 g/dL — AB (ref 12.0–15.0)
MCH: 20.6 pg — AB (ref 26.0–34.0)
MCHC: 26.6 g/dL — ABNORMAL LOW (ref 30.0–36.0)
MCV: 77.5 fL — AB (ref 78.0–100.0)
PLATELETS: 231 10*3/uL (ref 150–400)
RBC: 3.2 MIL/uL — ABNORMAL LOW (ref 3.87–5.11)
RDW: 16.9 % — ABNORMAL HIGH (ref 11.5–15.5)
WBC: 6.3 10*3/uL (ref 4.0–10.5)

## 2014-02-28 LAB — CULTURE, BLOOD (ROUTINE X 2)
CULTURE: NO GROWTH
Culture: NO GROWTH

## 2014-02-28 SURGERY — ESOPHAGOGASTRODUODENOSCOPY (EGD) WITH PROPOFOL
Anesthesia: Monitor Anesthesia Care

## 2014-02-28 MED ORDER — LIDOCAINE HCL (CARDIAC) 20 MG/ML IV SOLN
INTRAVENOUS | Status: AC
  Filled 2014-02-28: qty 5

## 2014-02-28 MED ORDER — MIDAZOLAM HCL 2 MG/2ML IJ SOLN
INTRAMUSCULAR | Status: AC
  Filled 2014-02-28: qty 2

## 2014-02-28 MED ORDER — FENTANYL CITRATE 0.05 MG/ML IJ SOLN
INTRAMUSCULAR | Status: AC
  Filled 2014-02-28: qty 2

## 2014-02-28 MED ORDER — ONDANSETRON HCL 4 MG/2ML IJ SOLN
INTRAMUSCULAR | Status: AC
  Filled 2014-02-28: qty 2

## 2014-02-28 MED ORDER — PROPOFOL 10 MG/ML IV BOLUS
INTRAVENOUS | Status: AC
  Filled 2014-02-28: qty 20

## 2014-02-28 MED ORDER — VITAMINS A & D EX OINT
TOPICAL_OINTMENT | CUTANEOUS | Status: AC
  Administered 2014-02-28: 5
  Filled 2014-02-28: qty 5

## 2014-02-28 SURGICAL SUPPLY — 24 items

## 2014-02-28 NOTE — Anesthesia Preprocedure Evaluation (Addendum)
Anesthesia Evaluation  Patient identified by MRN, date of birth, ID band Patient awake    Reviewed: Allergy & Precautions, H&P , NPO status , [name] Chart, lab work & pertinent test results  Airway Mallampati: II TM Distance: >3 FB Neck ROM: Limited    Dental no notable dental hx.    Pulmonary sleep apnea , COPDformer smoker,  breath sounds clear to auscultation  Pulmonary exam normal       Cardiovascular hypertension, Pt. on medications Rhythm:Regular Rate:Normal     Neuro/Psych negative neurological ROS  negative psych ROS   GI/Hepatic negative GI ROS, Neg liver ROS,   Endo/Other  diabetes, Insulin DependentHypothyroidism Morbid obesity  Renal/GU negative Renal ROS  negative genitourinary   Musculoskeletal negative musculoskeletal ROS (+)   Abdominal   Peds negative pediatric ROS (+)  Hematology  (+) anemia ,   Anesthesia Other Findings   Reproductive/Obstetrics negative OB ROS                           Anesthesia Physical Anesthesia Plan  ASA: IV  Anesthesia Plan: MAC   Post-op Pain Management:    Induction: Intravenous  Airway Management Planned: Nasal Cannula  Additional Equipment:   Intra-op Plan:   Post-operative Plan:   Informed Consent: I have reviewed the patients History and Physical, chart, labs and discussed the procedure including the risks, benefits and alternatives for the proposed anesthesia with the patient or authorized representative who has indicated his/her understanding and acceptance.     Plan Discussed with: CRNA and Surgeon  Anesthesia Plan Comments:         Anesthesia Quick Evaluation

## 2014-02-28 NOTE — Progress Notes (Signed)
Pt did not complete golytely GI prep. Pt encourage to drink GI prep multiple times. Pt tolerated clear liquid diet well otherwise. [facility] NP notified and ordered 20mg  dulcolax. Pt has had no bowel movements overnight.

## 2014-02-28 NOTE — Progress Notes (Signed)
[facility] PROGRESS NOTE  Karen BarrioChristy Rossner ZOX:[id number] DOB: 03/11/1961 DOA: 02/22/2014 PCP: Terald SleeperOBSON,MICHAEL GAVIN, MD  Assessment/Plan:  UTI  Patient has history of chronic UTI and has a suprapubic catheter in place.  Was started on IV Fortaz. Urine culture came back and pseudomonas is sensitive to Cipro.but patient is refusing to take oral ciprofloxacin, hence we will continue with IV fortaz. Discussed with pharmacy.   Anemia  Does not want blood transfusion Secondary to GI bleed, patient has guaiac positive stools.  Earlier patient had refused  for endoscopy and colonoscopy, but now wants to have colonoscopy, called and discussed with Dr Elnoria HowardHung, who put her on the schedule on 3/5. But she refused to drink the prep and the colonoscopy on 3/5 was cancelled. Dr Elnoria HowardHung signed off,. Pt became all teary eyed and reported that she doesn't know that she has to drink the prep/golytely. She promised to drink the prep and go ahead with the colonoscopy. Requested Dr schooler who is on call today, to see if can be done. Since she is on a lot of pain medications and narcotics, she might need propofol and she is scheduled for  colonoscopy in [place] , on Monday.  Hb is 6.5 today, patient does not want blood transfusion.  Left parotid swelling CT maxillofacial was done which showed mildly prominent lymph nodes over the left parotid gland without associated inflammatory change, no evidence of abscess findings may be seen in nonspecific parotitis, which could be inflammatory versus infectious etiology. At this time no acute issue, can follow up ENT as outpatient.  Depression  Patient is severely depressed, has been crying throughout the interview. She also has lot of anger against her partner, and also is very unhappy with the care provided at the nursing facility. Psychiatry  consulted to adjust the patient's medications for depression. We'll continue patient's home regimen for depression including Effexor,  Wellbutrin.    Chronic pain syndrome  Patient has history of Lyme disease, fibromyalgia and has been on methadone 10 mg by mouth 3 times a day along with oxycodone when necessary for pain. We'll continue the same medications.   Diabetes mellitus  Patient wants to eat regular food, as she feels that she doesn't have much quality of life. We'll continue the Novolin 70/30 along with sliding scale insulin.   Constipation  Patient says that she had last BM last night after she was given an enema, will continue the MiraLAX and enemas when necessary.   Abdominal pain  Patient has history of chronic UTI with suprapubic catheter in place. CT bone pelvis negative for acute pathology. Likely combination of constipation and chronic UTI. We'll continue with the methadone and when necessary oxycodone.  Hypokalemia We'll replace the potassium Check BMP in the morning   Scalp wound  Most likely self-inflicted, as patient complains of skin tags hanging around for head. And she admits to scratching on the wound. We'll continue with the hydroxyzine when necessary for itching. Patient needs to follow up Dermatology as outpatient.   Code Status: DNR Family Communication: *Discussed with patient in detail Disposition Plan: SNF possibly on Friday after colonoscopy.    Consultants:  Palliative care  Gastroenterology.   Procedures:  Colonoscopy planned for 3/5  Antibiotics:  None  HPI/Subjective: Patient seen and examined, feels better today. Hb is 6.5 today. Again refusing blood transfusion.  Objective: Filed Vitals:   02/28/14 1507  BP: 136/78  Pulse:   Temp:   Resp:     Intake/Output Summary (Last 24  hours) at 02/28/14 1855 Last data filed at 02/28/14 1503  Gross per 24 hour  Intake 390.67 ml  Output   1975 ml  Net -1584.33 ml   Filed Weights   02/22/14 1710  Weight: 127.914 kg (282 lb)    Exam:  Physical Exam: Head: Normocephalic, atraumatic.  Eyes: No signs of  jaundice, EOMI Nose: Mucous membranes dry.  Throat: Oropharynx nonerythematous, no exudate appreciated.  Neck: supple,No deformities, masses, or tenderness noted. Lungs: Normal respiratory effort. B/L Clear to auscultation, no crackles or wheezes.  Heart: Regular RR. S1 and S2 normal  Abdomen: BS normoactive. Soft, Nondistended, non-tender.  Extremities: No pretibial edema, no erythema   Data Reviewed: Basic Metabolic Panel:  Recent Labs Lab 02/22/14 1350 02/23/14 0138 02/24/14 0530  NA 137 137 142  K 3.2* 3.3* 3.9  CL 91* 93* 99  CO2 35* 34* 33*  GLUCOSE 144* 192* 157*  BUN 11 11 9   CREATININE 0.86 0.87 0.79  CALCIUM 9.3 8.8 8.8   Liver Function Tests:  Recent Labs Lab 02/22/14 1350 02/23/14 0138 02/24/14 0530  AST 61* 36 20  ALT 62* 51* 37*  ALKPHOS 146* 125* 97  BILITOT 0.5 0.4 0.2*  PROT 7.8 6.7 6.4  ALBUMIN 3.3* 3.0* 2.7*    Recent Labs Lab 02/22/14 1350  LIPASE 13    Recent Labs Lab 02/22/14 1356  AMMONIA 32   CBC:  Recent Labs Lab 02/22/14 1350  02/24/14 0530 02/25/14 0925 02/26/14 0540 02/27/14 0525 02/28/14 0425  WBC 8.6  < > 5.3 4.3 6.8 6.6 6.3  NEUTROABS 6.3  --   --   --   --   --   --   HGB 7.6*  < > 6.8* 6.5* 6.8* 6.5* 6.6*  HCT 27.6*  < > 24.9* 24.0* 25.3* 24.0* 24.8*  MCV 75.8*  < > 78.3 77.9* 77.4* 77.2* 77.5*  PLT 281  < > 218 214 258 221 231  < > = values in this interval not displayed. Cardiac Enzymes:  Recent Labs Lab 02/22/14 1350  TROPONINI <0.30   BNP (last 3 results) No results found for this basename: PROBNP,  in the last 8760 hours CBG:  Recent Labs Lab 02/27/14 1701 02/27/14 2224 02/28/14 0748 02/28/14 1206 02/28/14 1717  GLUCAP 103* 70 96 72 145*    Recent Results (from the past 240 hour(s))  CULTURE, BLOOD (ROUTINE X 2)     Status: None   Collection Time    02/22/14  2:35 PM      Result Value Ref Range Status   Specimen Description BLOOD RIGHT HAND  5 ML IN [facility] BOTTLE   Final   Special  Requests NONE   Final   Culture  Setup Time     Final   Value: 02/22/2014 16:40     Performed at [facility]   Culture     Final   Value: NO GROWTH 5 DAYS     Performed at [facility]   Report Status 02/28/2014 FINAL   Final  CULTURE, BLOOD (ROUTINE X 2)     Status: None   Collection Time    02/22/14  2:55 PM      Result Value Ref Range Status   Specimen Description BLOOD LEFT FOREARM  5 ML IN [facility] BOTTLE   Final   Special Requests NONE   Final   Culture  Setup Time     Final   Value: 02/22/2014 23:44     Performed  at [facility]   Culture     Final   Value: NO GROWTH 5 DAYS     Performed at [facility]   Report Status 02/28/2014 FINAL   Final  URINE CULTURE     Status: None   Collection Time    02/22/14  5:33 PM      Result Value Ref Range Status   Specimen Description URINE, CATHETERIZED   Final   Special Requests NONE   Final   Culture  Setup Time     Final   Value: 02/23/2014 01:43     Performed at [facility] Count     Final   Value: >=100,000 COLONIES/ML     Performed at [facility]   Culture     Final   Value: PSEUDOMONAS AERUGINOSA     Performed at [facility]   Report Status 02/25/2014 FINAL   Final   Organism ID, Bacteria PSEUDOMONAS AERUGINOSA   Final  MRSA PCR SCREENING     Status: Abnormal   Collection Time    02/23/14 12:11 AM      Result Value Ref Range Status   MRSA by PCR POSITIVE (*) NEGATIVE Final   Comment:            The GeneXpert MRSA Assay (FDA     approved for NASAL specimens     only), is one component of a     comprehensive MRSA colonization     surveillance program. It is not     intended to diagnose MRSA     infection nor to guide or     monitor treatment for     MRSA infections.     RESULT CALLED TO, READ BACK BY AND VERIFIED WITH:     SPOKE WITH ASARO,J RN [phone] COVINGTON,N     Studies: No results found.  Scheduled Meds: . buPROPion  300 mg Oral  Daily  . cefTAZidime (FORTAZ)  IV  1 g Intravenous 3 times per day  . docusate sodium  100 mg Oral BID  . doxycycline  100 mg Oral Q12H  . fluticasone  1 spray Each Nare Daily  . gabapentin  300 mg Oral BID  . hydrocortisone cream   Topical TID  . insulin aspart  0-9 Units Subcutaneous TID WC  . insulin aspart protamine- aspart  34 Units Subcutaneous BID WC  . levothyroxine  300 mcg Oral QAC breakfast  . loratadine  10 mg Oral Daily  . lubiprostone  24 mcg Oral BID WC  . methadone  30 mg Oral 3 times per day  . oxybutynin  5 mg Oral TID  . polyvinyl alcohol  1 drop Both Eyes BID  . sodium chloride  3 mL Intravenous Q12H  . sucralfate  1 g Oral TID WC & HS  . tiZANidine  4 mg Oral QPM  . traZODone  100 mg Oral QHS  . venlafaxine XR  75 mg Oral Q breakfast   Continuous Infusions: . sodium chloride 20 mL/hr at 02/27/14 2341    Principal Problem:   Urinary tract infection Active Problems:   Unspecified hereditary and idiopathic peripheral neuropathy   Chronic pain syndrome   HTN (hypertension)   Morbid obesity   Type II or unspecified type diabetes mellitus with peripheral circulatory disorders, uncontrolled(250.72)   Depression   Unspecified hypothyroidism   Unspecified constipation   COPD (chronic obstructive pulmonary disease)   Anemia, iron deficiency  Scalp lesion   Enlarged parotid gland   Inadequate material resources   DNR (do not resuscitate)    Time spent: 25 min    Lesle Faron  [facility] Pager [phone]. If 7PM-7AM, please contact night-coverage at [email or web address], password [facility] 02/28/2014, 6:55 PM  LOS: 6 days

## 2014-02-28 NOTE — Progress Notes (Signed)
I was informed that she did not attempt to drink any of her prep.  This is the second attempt with scheduling her for an endoscopic work up of her anemia.  Despite her protestations that there were other health reasons that precluded prior attempt [place] it is my opinion that the issue resides with the patient.  I can no longer provide any further assistance to this patient.  My physician-patient relationship is not a viable one.  I have made good faith efforts to provide her proper care.  Signing off.

## 2014-02-28 NOTE — Progress Notes (Signed)
Pt was lying in bed when I arrived. Pt began talking about her evening and the liquid given to her last evening in prep for procedure today. She talked about how she had dinner, etc, and began to feel sick. After several min, pt said her procedure was cxld b/c she had not been able to go to restroom. I asked pt about AD. She said she wanted to talk about it but could not the night b4. I told her I would return since her procedure was postponed and would also bring the devotional she asked for during our visit. Marjory LiesPamela Carrington Holder Chaplain

## 2014-02-28 NOTE — Progress Notes (Signed)
Endo called regarding pt procedure today, and made aware that pt did not drink prep last night, and had no bowel movements overnight. Per Endo RN, Dr. Elnoria HowardHung has cancelled both EGD and colonoscopy today. Will make pt and rounding MD aware.

## 2014-02-28 NOTE — Progress Notes (Signed)
NUTRITION FOLLOW UP  Intervention:   Encourage PO intake Offered RD assistance with meal ordering Continue with liberalized diet per pt request Will continue to monitor  Nutrition Dx:   Inadequate oral intake related to UTI as evidenced by <75% intake of meals-improving   Goal:   Pt to meet >/= 90% of their estimated nutrition needs    Monitor:   PO intake, weight, labs   Assessment:   3/01: Patient with multiple medical problems, including diabetes, SNF resistent with history of iron deficiency anemia. Admitted with UTI, anemia secondary to GI bleed.  Per chart review, patient with excessive eating at SNF, seen by RD there. However, her intake is fair since admission with 50-75% intake of meals. She does report some nausea and vomiting. She refuses diabetes diets, insisting on eating regular food. Unable to state usual body weight, no additional weights on file in chart.  [name] seen by palliative care.   3/05: -Pt refused to drink Golytely bowel prep for EGD and colonscopy per RN notes. GI signed off services d/t pt's repeated cancellations -PO intake 100%. Pt reported being hungry after being on clear liquid diet for 2 days Was eager to begin solid foods. Diet was advanced to regular on 3/05. Offered assistance with meal ordering, which patient declined -Continues to request liberalized diet d/t medical condition -Declined any other nutrition interventions/educations  Height: Ht Readings from Last 1 Encounters:  02/22/14 5\' 5"  (1.651 m)    Weight Status:   Wt Readings from Last 1 Encounters:  02/22/14 282 lb (127.914 kg)    Re-estimated needs:  Kcal: 2000-2300 kcal  Protein: 105-120 g  Fluid: >2.3 L/day   Skin: Scalp wounds, stage 2 pressure ulcer sacrum   Diet Order: General   Intake/Output Summary (Last 24 hours) at 02/28/14 1458 Last data filed at 02/28/14 0702  Gross per 24 hour  Intake 870.67 ml  Output   3975 ml  Net -3104.33 ml    Last BM:  3/05   Labs:   Recent Labs Lab 02/22/14 1350 02/23/14 0138 02/24/14 0530  NA 137 137 142  K 3.2* 3.3* 3.9  CL 91* 93* 99  CO2 35* 34* 33*  BUN 11 11 9   CREATININE 0.86 0.87 0.79  CALCIUM 9.3 8.8 8.8  GLUCOSE 144* 192* 157*    CBG (last 3)   Recent Labs  02/27/14 2224 02/28/14 0748 02/28/14 1206  GLUCAP 70 96 72    Scheduled Meds: . buPROPion  300 mg Oral Daily  . cefTAZidime (FORTAZ)  IV  1 g Intravenous 3 times per day  . docusate sodium  100 mg Oral BID  . doxycycline  100 mg Oral Q12H  . fluticasone  1 spray Each Nare Daily  . gabapentin  300 mg Oral BID  . hydrocortisone cream   Topical TID  . insulin aspart  0-9 Units Subcutaneous TID WC  . insulin aspart protamine- aspart  34 Units Subcutaneous BID WC  . levothyroxine  300 mcg Oral QAC breakfast  . loratadine  10 mg Oral Daily  . lubiprostone  24 mcg Oral BID WC  . methadone  30 mg Oral 3 times per day  . oxybutynin  5 mg Oral TID  . polyvinyl alcohol  1 drop Both Eyes BID  . sodium chloride  3 mL Intravenous Q12H  . sucralfate  1 g Oral TID WC & HS  . tiZANidine  4 mg Oral QPM  . traZODone  100 mg Oral QHS  .  venlafaxine XR  75 mg Oral Q breakfast    Continuous Infusions: . sodium chloride 20 mL/hr at 02/27/14 2341    Lloyd Huger MS RD LDN Clinical Dietitian [phone]

## 2014-02-28 NOTE — Progress Notes (Signed)
Pt was awake and lying in bed when I arrived. The room was really cold. Pt, again, talked about her evening last night. When asked about AD pt said she had one but it was not completed. I asked if it had been notarized and she said yes. It appears to be one on file according to chart. I explained that if it had been notified, it was completed and in place. She said part of it was not filled out and I explained that there are parts that would not be filled out because the form gives options and every line would not need to be completed.  I asked pt if she had talked w/dr and nurse about AD and instructions should she become unresponsive. At first she said yes and she said she did not want them to do anything if she were unresponsive saying she did not want to continue to be here and live in pain. I repeated to confirm DNR and pt said yes.  Later during the conversation pt denied giving permission for DNR and did not have an AD. Pt seemed very confused about DNR and AD process and about instructions her wishes. Pt asked about the possibility of the same process as pt in [place] saying if she is in a lot of pain she did not want to continue to live in pain. She said she is not suicidal but did not want to live in pain. I explained to pt that we do not have those practices in [place] as it is not legal. Pt seemed frustrated and said that's why she hated [place].  She said she should have a choice about whether she wanted to live in pain. I briefly explained the AD to pt and left AD information w/pt to read and told her I would follow-up w/her to answer questions. I have also spoken w/pt's nurse about her DNR status who said she would talk w/pt's physician since pt denied DNR during our conversation (even though she also said she had approved DNR w/dr).  Since pt seems confused about AD, DNR and is inconsistent with her thoughts and conversation, and pt says she does not have anyone to appoint over AD, and there appears to be an AD  on file, I will not execute another AD at this time. Also, according to Chaplain Lumpkin, pt said she wanted another DNR in order to appoint another designee since she and her partner divorced/seperated.   Karen LiesPamela Carrington Walls Chaplain [phone]

## 2014-03-01 DIAGNOSIS — F332 Major depressive disorder, recurrent severe without psychotic features: Secondary | ICD-10-CM

## 2014-03-01 LAB — URINE MICROSCOPIC-ADD ON

## 2014-03-01 LAB — URINALYSIS, ROUTINE W REFLEX MICROSCOPIC
Glucose, UA: NEGATIVE mg/dL
Hgb urine dipstick: NEGATIVE
KETONES UR: 15 mg/dL — AB
NITRITE: POSITIVE — AB
Protein, ur: NEGATIVE mg/dL
Specific Gravity, Urine: 1.015 (ref 1.005–1.030)
UROBILINOGEN UA: 4 mg/dL — AB (ref 0.0–1.0)
pH: 5 (ref 5.0–8.0)

## 2014-03-01 LAB — GLUCOSE, CAPILLARY
GLUCOSE-CAPILLARY: 209 mg/dL — AB (ref 70–99)
Glucose-Capillary: 122 mg/dL — ABNORMAL HIGH (ref 70–99)
Glucose-Capillary: 79 mg/dL (ref 70–99)
Glucose-Capillary: 202 mg/dL — ABNORMAL HIGH (ref 70–99)
Glucose-Capillary: 221 mg/dL — ABNORMAL HIGH (ref 70–99)

## 2014-03-01 LAB — CBC
HCT: 25.1 % — ABNORMAL LOW (ref 36.0–46.0)
HEMOGLOBIN: 6.7 g/dL — AB (ref 12.0–15.0)
MCH: 20.7 pg — AB (ref 26.0–34.0)
MCHC: 26.7 g/dL — ABNORMAL LOW (ref 30.0–36.0)
MCV: 77.7 fL — ABNORMAL LOW (ref 78.0–100.0)
Platelets: 252 10*3/uL (ref 150–400)
RBC: 3.23 MIL/uL — ABNORMAL LOW (ref 3.87–5.11)
RDW: 16.8 % — ABNORMAL HIGH (ref 11.5–15.5)
WBC: 5.5 10*3/uL (ref 4.0–10.5)

## 2014-03-01 MED ORDER — NYSTATIN 100000 UNIT/GM EX OINT
TOPICAL_OINTMENT | Freq: Two times a day (BID) | CUTANEOUS | Status: DC
  Administered 2014-03-01 – 2014-03-11 (×17): via TOPICAL
  Filled 2014-03-01: qty 15

## 2014-03-01 MED ORDER — NYSTATIN 100000 UNIT/GM EX POWD
Freq: Two times a day (BID) | CUTANEOUS | Status: DC
  Filled 2014-03-01: qty 15

## 2014-03-01 MED ORDER — PROMETHAZINE HCL 25 MG/ML IJ SOLN: 6.2500 mg | INTRAMUSCULAR | Status: DC | PRN

## 2014-03-01 NOTE — Progress Notes (Signed)
Clinical Social Work  CSW met with patient at bedside. Patient reports that she is feeling overwhelmed and upset about her situation. Patient reports that she is ready to DC and to get her own apartment. Patient has spoken to her mother who reports that [employer] is now receiving patient social security check and patient is upset because she thought she was going to get her check. CSW and patient spoke about how if patient wants to live independently then she would need an income, have CAPS aides set back up, get all equipment set up, and figure out transportation needs. Patient reports she needs a CSW to set up all plans because it is difficult for her to ambulate and talk on the phone. CSW explained that if patient is wanting to reach the goal to be independent then she would have to take control of the situation and follow through with plans. CSW provided patient with legal aide information and low income housing per patient request. Patient reports she will review information and work on plans.  CSW and patient discussed emotional stressors. Patient continues to feel depressed and overwhelmed. Patient reports she did not envision her life as living in SNF and not having her independence. Patient reports that she is not SI or HI and has motivation to decrease depression so that she may have a better quality of life. Psych MD entered to assess patient so CSW will follow up at later time.  [place], [place] [phone]

## 2014-03-01 NOTE — Progress Notes (Signed)
CSW assisting with d/c planning. No d/c order at this time. [facility] if stable for d/c.CSW will continue to follow to assist with d/c planning to SNF.  Cori RazorJamie Tashea Othman LCSW [phone]

## 2014-03-01 NOTE — Progress Notes (Signed)
Clinical Social Work  CSW attempted to meet with patient but patient reports she has a headache and does not want to speak at this time.  CSW will follow up at later time.  Sur[place]olly Jayleene Walls, [place] [phone]

## 2014-03-01 NOTE — Progress Notes (Signed)
[facility] PROGRESS NOTE  [name] ZOX:[id number] DOB: 1961/05/24 DOA: 02/22/2014 PCP: Terald Sleeper, MD  Assessment/Plan:  UTI  Patient has history of chronic UTI and has a suprapubic catheter in place.  Was started on IV Fortaz. Urine culture came back and pseudomonas is sensitive to Cipro.but patient is refusing to take oral ciprofloxacin, hence we will continue with IV fortaz. Discussed with pharmacy.   Anemia  Does not want blood transfusion Secondary to GI bleed, patient has guaiac positive stools.  Earlier patient had refused  for endoscopy and colonoscopy, but now wants to have colonoscopy, called and discussed with Dr Elnoria Howard, who put her on the schedule on 3/5. But she refused to drink the prep and the colonoscopy on 3/5 was cancelled. Dr Elnoria Howard signed off,. Pt became all teary eyed and reported that she doesn't know that she has to drink the prep/golytely. She promised to drink the prep and go ahead with the colonoscopy. Requested Dr schooler who is on call today, to see if can be done. Since she is on a lot of pain medications and narcotics, she might need propofol and she is scheduled for  colonoscopy in [place] , on Monday.  Hb is 6.5 today, patient does not want blood transfusion.  Left parotid swelling CT maxillofacial was done which showed mildly prominent lymph nodes over the left parotid gland without associated inflammatory change, no evidence of abscess findings may be seen in nonspecific parotitis, which could be inflammatory versus infectious etiology. At this time no acute issue, can follow up ENT as outpatient.  Depression  Patient is severely depressed, has been crying throughout the interview. She also has lot of anger against her partner, and also is very unhappy with the care provided at the nursing facility. Psychiatry  consulted to adjust the patient's medications for depression. We'll continue patient's home regimen for depression including Effexor,  Wellbutrin.    Chronic pain syndrome  Patient has history of Lyme disease, fibromyalgia and has been on methadone 10 mg by mouth 3 times a day along with oxycodone when necessary for pain. We'll continue the same medications.   Diabetes mellitus  Patient wants to eat regular food, as she feels that she doesn't have much quality of life. We'll continue the Novolin 70/30 along with sliding scale insulin. CBG (last 3)   Recent Labs  02/28/14 2125 03/01/14 0747 03/01/14 1223  GLUCAP 172* 122* 79       Constipation  Patient says that she had last BM last night after she was given an enema, will continue the MiraLAX and enemas when necessary.   Abdominal pain  Patient has history of chronic UTI with suprapubic catheter in place. CT bone pelvis negative for acute pathology. Likely combination of constipation and chronic UTI. We'll continue with the methadone and when necessary oxycodone.  Hypokalemia We'll replace the potassium Check BMP in the morning   Scalp wound  Most likely self-inflicted, as patient complains of skin tags hanging around for head. And she admits to scratching on the wound. We'll continue with the hydroxyzine when necessary for itching. Patient needs to follow up Dermatology as outpatient.   Code Status: DNR Family Communication: *Discussed with patient in detail Disposition Plan: SNF possibly on monday after colonoscopy.    Consultants:  Palliative care  Gastroenterology.   Procedures:  Colonoscopy planned for 3/5  Antibiotics:  None  HPI/Subjective: Patient seen and examined, feels better today. Hb is 6.7 today. Again refusing blood transfusion.  Objective: Filed Vitals:  03/01/14 1404  BP: 133/80  Pulse: 88  Temp: 99 F (37.2 C)  Resp: 18    Intake/Output Summary (Last 24 hours) at 03/01/14 1444 Last data filed at 03/01/14 1404  Gross per 24 hour  Intake 1519.33 ml  Output   1650 ml  Net -130.67 ml   Filed Weights   02/22/14  1710  Weight: 127.914 kg (282 lb)    Exam:  Physical Exam: Head: Normocephalic, atraumatic.  Eyes: No signs of jaundice, EOMI Nose: Mucous membranes dry.  Throat: Oropharynx nonerythematous, no exudate appreciated.  Neck: supple,No deformities, masses, or tenderness noted. Lungs: Normal respiratory effort. B/L Clear to auscultation, no crackles or wheezes.  Heart: Regular RR. S1 and S2 normal  Abdomen: BS normoactive. Soft, Nondistended, non-tender.  Extremities: No pretibial edema, no erythema   Data Reviewed: Basic Metabolic Panel:  Recent Labs Lab 02/23/14 0138 02/24/14 0530  NA 137 142  K 3.3* 3.9  CL 93* 99  CO2 34* 33*  GLUCOSE 192* 157*  BUN 11 9  CREATININE 0.87 0.79  CALCIUM 8.8 8.8   Liver Function Tests:  Recent Labs Lab 02/23/14 0138 02/24/14 0530  AST 36 20  ALT 51* 37*  ALKPHOS 125* 97  BILITOT 0.4 0.2*  PROT 6.7 6.4  ALBUMIN 3.0* 2.7*   No results found for this basename: LIPASE, AMYLASE,  in the last 168 hours No results found for this basename: AMMONIA,  in the last 168 hours CBC:  Recent Labs Lab 02/25/14 0925 02/26/14 0540 02/27/14 0525 02/28/14 0425 03/01/14 0400  WBC 4.3 6.8 6.6 6.3 5.5  HGB 6.5* 6.8* 6.5* 6.6* 6.7*  HCT 24.0* 25.3* 24.0* 24.8* 25.1*  MCV 77.9* 77.4* 77.2* 77.5* 77.7*  PLT 214 258 221 231 252   Cardiac Enzymes: No results found for this basename: CKTOTAL, CKMB, CKMBINDEX, TROPONINI,  in the last 168 hours BNP (last 3 results) No results found for this basename: PROBNP,  in the last 8760 hours CBG:  Recent Labs Lab 02/28/14 1206 02/28/14 1717 02/28/14 2125 03/01/14 0747 03/01/14 1223  GLUCAP 72 145* 172* 122* 79    Recent Results (from the past 240 hour(s))  CULTURE, BLOOD (ROUTINE X 2)     Status: None   Collection Time    02/22/14  2:35 PM      Result Value Ref Range Status   Specimen Description BLOOD RIGHT HAND  5 ML IN [facility] BOTTLE   Final   Special Requests NONE   Final   Culture  Setup  Time     Final   Value: 02/22/2014 16:40     Performed at [facility]   Culture     Final   Value: NO GROWTH 5 DAYS     Performed at [facility]   Report Status 02/28/2014 FINAL   Final  CULTURE, BLOOD (ROUTINE X 2)     Status: None   Collection Time    02/22/14  2:55 PM      Result Value Ref Range Status   Specimen Description BLOOD LEFT FOREARM  5 ML IN [facility] BOTTLE   Final   Special Requests NONE   Final   Culture  Setup Time     Final   Value: 02/22/2014 23:44     Performed at [facility]   Culture     Final   Value: NO GROWTH 5 DAYS     Performed at [facility]   Report Status 02/28/2014 FINAL  Final  URINE CULTURE     Status: None   Collection Time    02/22/14  5:33 PM      Result Value Ref Range Status   Specimen Description URINE, CATHETERIZED   Final   Special Requests NONE   Final   Culture  Setup Time     Final   Value: 02/23/2014 01:43     Performed at [facility]   Colony Count     Final   Value: >=100,000 COLONIES/ML     Performed at [facility]   Culture     Final   Value: PSEUDOMONAS AERUGINOSA     Performed at [facility]   Report Status 02/25/2014 FINAL   Final   Organism ID, Bacteria PSEUDOMONAS AERUGINOSA   Final  MRSA PCR SCREENING     Status: Abnormal   Collection Time    02/23/14 12:11 AM      Result Value Ref Range Status   MRSA by PCR POSITIVE (*) NEGATIVE Final   Comment:            The GeneXpert MRSA Assay (FDA     approved for NASAL specimens     only), is one component of a     comprehensive MRSA colonization     surveillance program. It is not     intended to diagnose MRSA     infection nor to guide or     monitor treatment for     MRSA infections.     RESULT CALLED TO, READ BACK BY AND VERIFIED WITH:     SPOKE WITH ASARO,J RN [phone] COVINGTON,N     Studies: No results found.  Scheduled Meds: . buPROPion  300 mg Oral Daily  . cefTAZidime (FORTAZ)  IV  1 g  Intravenous 3 times per day  . docusate sodium  100 mg Oral BID  . fluticasone  1 spray Each Nare Daily  . gabapentin  300 mg Oral BID  . hydrocortisone cream   Topical TID  . insulin aspart  0-9 Units Subcutaneous TID WC  . insulin aspart protamine- aspart  34 Units Subcutaneous BID WC  . levothyroxine  300 mcg Oral QAC breakfast  . loratadine  10 mg Oral Daily  . lubiprostone  24 mcg Oral BID WC  . methadone  30 mg Oral 3 times per day  . nystatin   Topical BID  . oxybutynin  5 mg Oral TID  . polyvinyl alcohol  1 drop Both Eyes BID  . sodium chloride  3 mL Intravenous Q12H  . sucralfate  1 g Oral TID WC & HS  . tiZANidine  4 mg Oral QPM  . traZODone  100 mg Oral QHS  . venlafaxine XR  75 mg Oral Q breakfast   Continuous Infusions: . sodium chloride 20 mL/hr at 02/27/14 2341    Principal Problem:   Urinary tract infection Active Problems:   Unspecified hereditary and idiopathic peripheral neuropathy   Chronic pain syndrome   HTN (hypertension)   Morbid obesity   Type II or unspecified type diabetes mellitus with peripheral circulatory disorders, uncontrolled(250.72)   Depression   Unspecified hypothyroidism   Unspecified constipation   COPD (chronic obstructive pulmonary disease)   Anemia, iron deficiency   Scalp lesion   Enlarged parotid gland   Inadequate material resources   DNR (do not resuscitate)    Time spent: 25 min    Jayjay Littles  [facility] Pager [phone]. If 7PM-7AM,  please contact night-coverage at [email or web address], password [email or web address] 03/01/2014, 2:44 PM  LOS: 7 days

## 2014-03-01 NOTE — Clinical Social Work Placement (Unsigned)
     Clinical Social Work Department CLINICAL SOCIAL WORK PLACEMENT NOTE 03/01/2014  Patient:  [name],[name]  Account Number:  [id number] Admit date:  02/22/2014  Clinical Social Worker:  Becky SaxBETHANY Cashawn Yanko, LCSW  Date/time:  03/01/2014 12:00 M  Clinical Social Work is seeking post-discharge placement for this patient at the following level of care:   SKILLED NURSING   (*CSW will update this form in Epic as items are completed)   03/01/2014  [facility]family [facility] with [facility] list of facilities offering this level of care within the geographic area requested by the patient (or if unable, by the patients family).  03/01/2014  Patient/family informed of their freedom to choose among providers that offer the needed level of care, that participate in Medicare, Medicaid or managed care program needed by the patient, have an available bed and are willing to accept the patient.  03/01/2014  Patient/family informed of [facility] ownership interest in [facility], as well as of the fact that they are under no obligation to receive care at this facility.  PASARR submitted to [employer] on 03/01/2014 PASARR number received from [employer] on 03/01/2014  FL2 transmitted to all facilities in geographic area requested by pt/family on  03/01/2014 FL2 transmitted to all facilities within larger geographic area on   Patient informed that his/her managed care company has contracts with or will negotiate with  certain facilities, including the following:     Patient/family informed of bed offers received:  03[facility]01/2014 [facility] Physician recommends and patient chooses bed at    Patient to be transferred to  on   Patient to be transferred to facility by   The following physician request were entered in Epic:   Additional Comments:
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
## 2014-03-02 LAB — GLUCOSE, CAPILLARY
GLUCOSE-CAPILLARY: 114 mg/dL — AB (ref 70–99)
GLUCOSE-CAPILLARY: 107 mg/dL — AB (ref 70–99)
Glucose-Capillary: 219 mg/dL — ABNORMAL HIGH (ref 70–99)
Glucose-Capillary: 107 mg/dL — ABNORMAL HIGH (ref 70–99)
Glucose-Capillary: 174 mg/dL — ABNORMAL HIGH (ref 70–99)

## 2014-03-02 LAB — CBC
HCT: 24.3 % — ABNORMAL LOW (ref 36.0–46.0)
HEMOGLOBIN: 6.3 g/dL — AB (ref 12.0–15.0)
MCH: 20.1 pg — AB (ref 26.0–34.0)
MCHC: 25.9 g/dL — ABNORMAL LOW (ref 30.0–36.0)
MCV: 77.6 fL — AB (ref 78.0–100.0)
Platelets: 247 10*3/uL (ref 150–400)
RBC: 3.13 MIL/uL — ABNORMAL LOW (ref 3.87–5.11)
RDW: 16.9 % — ABNORMAL HIGH (ref 11.5–15.5)
WBC: 4.5 10*3/uL (ref 4.0–10.5)

## 2014-03-02 NOTE — Progress Notes (Signed)
[facility] PROGRESS NOTE  [name] ZOX:[id number] DOB: 1961/08/16 DOA: 02/22/2014 PCP: Terald Sleeper, MD  Assessment/Plan:  UTI  Patient has history of chronic UTI and has a suprapubic catheter in place.  Was started on IV Fortaz. Urine culture came back and pseudomonas is sensitive to Cipro.but patient is refusing to take oral ciprofloxacin, hence we will continue with IV fortaz. Discussed with pharmacy.    Anemia  Does not want blood transfusion Secondary to GI bleed, patient has guaiac positive stools.  Earlier patient had refused  for endoscopy and colonoscopy, but now wants to have colonoscopy, called and discussed with Dr Elnoria Howard, who put her on the schedule on 3/5. But she refused to drink the prep and the colonoscopy on 3/5 was cancelled. Dr Elnoria Howard signed off,. Pt became all teary eyed and reported that she doesn't know that she has to drink the prep/golytely. She promised to drink the prep and go ahead with the colonoscopy. Requested Dr schooler who is on call today, to see if can be done. Since she is on a lot of pain medications and narcotics, she might need propofol and she is scheduled for  colonoscopy in [place] , on Monday.  Hb is 6.3 today, patient does not want blood transfusion.  Left parotid swelling CT maxillofacial was done which showed mildly prominent lymph nodes over the left parotid gland without associated inflammatory change, no evidence of abscess findings may be seen in nonspecific parotitis, which could be inflammatory versus infectious etiology. At this time no acute issue, can follow up ENT as outpatient.  Depression  Patient is severely depressed, has been crying throughout the interview. She also has lot of anger against her partner, and also is very unhappy with the care provided at the nursing facility. Psychiatry  consulted to adjust the patient's medications for depression. We'll continue patient's home regimen for depression including Effexor,  Wellbutrin.    Chronic pain syndrome  Patient has history of Lyme disease, fibromyalgia and has been on methadone 10 mg by mouth 3 times a day along with oxycodone when necessary for pain. We'll continue the same medications.   Diabetes mellitus  Patient wants to eat regular food, as she feels that she doesn't have much quality of life. We'll continue the Novolin 70/30 along with sliding scale insulin. CBG (last 3)   Recent Labs  03/02/14 1218 03/02/14 1720 03/02/14 1826  GLUCAP 114* 107* 107*       Constipation  Patient says that she had last BM last night after she was given an enema, will continue the MiraLAX and enemas when necessary.   Abdominal pain  Patient has history of chronic UTI with suprapubic catheter in place. CT bone pelvis negative for acute pathology. Likely combination of constipation and chronic UTI. We'll continue with the methadone and when necessary oxycodone.  Hypokalemia We'll replace the potassium Check BMP in the morning   Scalp wound  Most likely self-inflicted, as patient complains of skin tags hanging around for head. And she admits to scratching on the wound. We'll continue with the hydroxyzine when necessary for itching. Patient needs to follow up Dermatology as outpatient.   Code Status: DNR Family Communication: *Discussed with patient in detail Disposition Plan: SNF possibly on monday after colonoscopy.    Consultants:  Palliative care  Gastroenterology.   Procedures:  Colonoscopy planned for 3/5  Antibiotics:  None  HPI/Subjective: Patient seen and examined, feels better today. Hb is 6.3 today. Again refusing blood transfusion.  Objective: Filed  Vitals:   03/02/14 1400  BP: 117/70  Pulse: 76  Temp: 97.4 F (36.3 C)  Resp: 18    Intake/Output Summary (Last 24 hours) at 03/02/14 1945 Last data filed at 03/02/14 1800  Gross per 24 hour  Intake   1312 ml  Output   3150 ml  Net  -1838 ml   Filed Weights   02/22/14  1710  Weight: 127.914 kg (282 lb)    Exam:  Physical Exam: Head: Normocephalic, atraumatic.  Eyes: No signs of jaundice, EOMI Nose: Mucous membranes dry.  Throat: Oropharynx nonerythematous, no exudate appreciated.  Neck: supple,No deformities, masses, or tenderness noted. Lungs: Normal respiratory effort. B/L Clear to auscultation, no crackles or wheezes.  Heart: Regular RR. S1 and S2 normal  Abdomen: BS normoactive. Soft, Nondistended, non-tender.  Extremities: No pretibial edema, no erythema   Data Reviewed: Basic Metabolic Panel:  Recent Labs Lab 02/24/14 0530  NA 142  K 3.9  CL 99  CO2 33*  GLUCOSE 157*  BUN 9  CREATININE 0.79  CALCIUM 8.8   Liver Function Tests:  Recent Labs Lab 02/24/14 0530  AST 20  ALT 37*  ALKPHOS 97  BILITOT 0.2*  PROT 6.4  ALBUMIN 2.7*   No results found for this basename: LIPASE, AMYLASE,  in the last 168 hours No results found for this basename: AMMONIA,  in the last 168 hours CBC:  Recent Labs Lab 02/26/14 0540 02/27/14 0525 02/28/14 0425 03/01/14 0400 03/02/14 0518  WBC 6.8 6.6 6.3 5.5 4.5  HGB 6.8* 6.5* 6.6* 6.7* 6.3*  HCT 25.3* 24.0* 24.8* 25.1* 24.3*  MCV 77.4* 77.2* 77.5* 77.7* 77.6*  PLT 258 221 231 252 247   Cardiac Enzymes: No results found for this basename: CKTOTAL, CKMB, CKMBINDEX, TROPONINI,  in the last 168 hours BNP (last 3 results) No results found for this basename: PROBNP,  in the last 8760 hours CBG:  Recent Labs Lab 03/01/14 2159 03/02/14 0805 03/02/14 1218 03/02/14 1720 03/02/14 1826  GLUCAP 221* 219* 114* 107* 107*    Recent Results (from the past 240 hour(s))  CULTURE, BLOOD (ROUTINE X 2)     Status: None   Collection Time    02/22/14  2:35 PM      Result Value Ref Range Status   Specimen Description BLOOD RIGHT HAND  5 ML IN [facility] BOTTLE   Final   Special Requests NONE   Final   Culture  Setup Time     Final   Value: 02/22/2014 16:40     Performed at [facility]    Culture     Final   Value: NO GROWTH 5 DAYS     Performed at [facility]   Report Status 02/28/2014 FINAL   Final  CULTURE, BLOOD (ROUTINE X 2)     Status: None   Collection Time    02/22/14  2:55 PM      Result Value Ref Range Status   Specimen Description BLOOD LEFT FOREARM  5 ML IN [facility] BOTTLE   Final   Special Requests NONE   Final   Culture  Setup Time     Final   Value: 02/22/2014 23:44     Performed at [facility]   Culture     Final   Value: NO GROWTH 5 DAYS     Performed at [facility]   Report Status 02/28/2014 FINAL   Final  URINE CULTURE     Status: None  Collection Time    02/22/14  5:33 PM      Result Value Ref Range Status   Specimen Description URINE, CATHETERIZED   Final   Special Requests NONE   Final   Culture  Setup Time     Final   Value: 02/23/2014 01:43     Performed at [facility]   Colony Count     Final   Value: >=100,000 COLONIES/ML     Performed at [facility]   Culture     Final   Value: PSEUDOMONAS AERUGINOSA     Performed at [facility]   Report Status 02/25/2014 FINAL   Final   Organism ID, Bacteria PSEUDOMONAS AERUGINOSA   Final  MRSA PCR SCREENING     Status: Abnormal   Collection Time    02/23/14 12:11 AM      Result Value Ref Range Status   MRSA by PCR POSITIVE (*) NEGATIVE Final   Comment:            The GeneXpert MRSA Assay (FDA     approved for NASAL specimens     only), is one component of a     comprehensive MRSA colonization     surveillance program. It is not     intended to diagnose MRSA     infection nor to guide or     monitor treatment for     MRSA infections.     RESULT CALLED TO, READ BACK BY AND VERIFIED WITH:     SPOKE WITH ASARO,J RN [phone] COV[place],N  URINE CULTURE     Status: None   Collection Time    03/01/14  3:00 PM      Result Value Ref Range Status   Specimen Description URINE, CLEAN CATCH   Final   Special Requests NONE   Final   Culture   Setup Time     Final   Value: 03/01/2014 21:20     Performed at [facility] Count PENDING   Incomplete   Culture     Final   Value: Culture reincubated for better growth     Performed at [facility]   Report Status PENDING   Incomplete     Studies: No results found.  Scheduled Meds: . buPROPion  300 mg Oral Daily  . cefTAZidime (FORTAZ)  IV  1 g Intravenous 3 times per day  . docusate sodium  100 mg Oral BID  . fluticasone  1 spray Each Nare Daily  . gabapentin  300 mg Oral BID  . hydrocortisone cream   Topical TID  . insulin aspart  0-9 Units Subcutaneous TID WC  . insulin aspart protamine- aspart  34 Units Subcutaneous BID WC  . levothyroxine  300 mcg Oral QAC breakfast  . loratadine  10 mg Oral Daily  . lubiprostone  24 mcg Oral BID WC  . methadone  30 mg Oral 3 times per day  . nystatin ointment   Topical BID  . oxybutynin  5 mg Oral TID  . polyvinyl alcohol  1 drop Both Eyes BID  . sodium chloride  3 mL Intravenous Q12H  . sucralfate  1 g Oral TID WC & HS  . tiZANidine  4 mg Oral QPM  . traZODone  100 mg Oral QHS  . venlafaxine XR  75 mg Oral Q breakfast   Continuous Infusions: . sodium chloride 20 mL/hr at 03/02/14 0981    Principal Problem:  Urinary tract infection Active Problems:   Unspecified hereditary and idiopathic peripheral neuropathy   Chronic pain syndrome   HTN (hypertension)   Morbid obesity   Type II or unspecified type diabetes mellitus with peripheral circulatory disorders, uncontrolled(250.72)   Depression   Unspecified hypothyroidism   Unspecified constipation   COPD (chronic obstructive pulmonary disease)   Anemia, iron deficiency   Scalp lesion   Enlarged parotid gland   Inadequate material resources   DNR (do not resuscitate)    Time spent: 25 min    [name]  [facility] Pager [phone]. If 7PM-7AM, please contact night-coverage at [email or web address], password [email or web address] 03/02/2014, 7:45 PM  LOS: 8  days

## 2014-03-02 NOTE — Progress Notes (Signed)
Dr. Blake DivineAkula aware via phone pt's BCG 107. Pt scheduled for Novolog 70/30 34 Units SQ. Pt recently ate, yet first tray of the day. See orders entered by MD to recheck CBG in 1 hr and to cover only if found greater than 150.

## 2014-03-02 NOTE — Progress Notes (Signed)
CBG remains at 107 even after eating. Evening 70/30 held per Dr. Darci NeedleAkula's order.

## 2014-03-03 DIAGNOSIS — D509 Iron deficiency anemia, unspecified: Secondary | ICD-10-CM

## 2014-03-03 LAB — CBC
HEMATOCRIT: 23.4 % — AB (ref 36.0–46.0)
Hemoglobin: 6.4 g/dL — CL (ref 12.0–15.0)
MCH: 21 pg — AB (ref 26.0–34.0)
MCHC: 27.4 g/dL — ABNORMAL LOW (ref 30.0–36.0)
MCV: 76.7 fL — ABNORMAL LOW (ref 78.0–100.0)
Platelets: 259 10*3/uL (ref 150–400)
RBC: 3.05 MIL/uL — ABNORMAL LOW (ref 3.87–5.11)
RDW: 16.6 % — ABNORMAL HIGH (ref 11.5–15.5)
WBC: 5.8 10*3/uL (ref 4.0–10.5)

## 2014-03-03 LAB — BASIC METABOLIC PANEL
BUN: 9 mg/dL (ref 6–23)
CALCIUM: 8.8 mg/dL (ref 8.4–10.5)
CO2: 34 mEq/L — ABNORMAL HIGH (ref 19–32)
CREATININE: 0.76 mg/dL (ref 0.50–1.10)
Chloride: 99 mEq/L (ref 96–112)
GFR calc Af Amer: >90 mL/min (ref >90)
GFR calc non Af Amer: >90 mL/min (ref >90)
GLUCOSE: 199 mg/dL — AB (ref 70–99)
Potassium: 3.9 mEq/L (ref 3.7–5.3)
Sodium: 141 mEq/L (ref 137–147)

## 2014-03-03 LAB — URINE CULTURE: Colony Count: 50000

## 2014-03-03 LAB — GLUCOSE, CAPILLARY
Glucose-Capillary: 218 mg/dL — ABNORMAL HIGH (ref 70–99)
Glucose-Capillary: 132 mg/dL — ABNORMAL HIGH (ref 70–99)
Glucose-Capillary: 129 mg/dL — ABNORMAL HIGH (ref 70–99)
Glucose-Capillary: 78 mg/dL (ref 70–99)

## 2014-03-03 MED ORDER — VITAMINS A & D EX OINT
TOPICAL_OINTMENT | CUTANEOUS | Status: AC
  Administered 2014-03-03: 05:00:00
  Filled 2014-03-03: qty 5

## 2014-03-03 MED ORDER — PEG 3350-KCL-NA BICARB-NACL 420 G PO SOLR
4000.0000 mL | Freq: Once | ORAL | Status: AC
  Administered 2014-03-03: 4000 mL via ORAL

## 2014-03-03 NOTE — Progress Notes (Signed)
[facility] PROGRESS NOTE  [name] ZOX:[id number] DOB: 30-Jul-1961 DOA: 02/22/2014 PCP: Terald Sleeper, MD  Assessment/Plan:  UTI  Patient has history of chronic UTI and has a suprapubic catheter in place.  Was started on IV Fortaz. Urine culture came back and pseudomonas is sensitive to Cipro.but patient is refusing to take oral ciprofloxacin, hence we will continue with IV fortaz. Discussed with pharmacy.    Anemia  Does not want blood transfusion Secondary to GI bleed, patient has guaiac positive stools.  Earlier patient had refused  for endoscopy and colonoscopy, but now wants to have colonoscopy, called and discussed with Dr Elnoria Howard, who put her on the schedule on 3/5. But she refused to drink the prep and the colonoscopy on 3/5 was cancelled. Dr Elnoria Howard signed off,. Pt became all teary eyed and reported that she doesn't know that she has to drink the prep/golytely. She promised to drink the prep and go ahead with the colonoscopy. Requested Dr schooler who is on call , to see if can be done. Since she is on a lot of pain medications and narcotics, she might need propofol and she is scheduled for  colonoscopy in [place] , on Monday.  Hb is 6.3 today, patient does not want blood transfusion.  Left parotid swelling CT maxillofacial was done which showed mildly prominent lymph nodes over the left parotid gland without associated inflammatory change, no evidence of abscess findings may be seen in nonspecific parotitis, which could be inflammatory versus infectious etiology. At this time no acute issue, can follow up ENT as outpatient.  Depression  Patient is severely depressed, has been crying throughout the interview. She also has lot of anger against her partner, and also is very unhappy with the care provided at the nursing facility. Psychiatry  consulted to adjust the patient's medications for depression. We'll continue patient's home regimen for depression including Effexor,  Wellbutrin.    Chronic pain syndrome  Patient has history of Lyme disease, fibromyalgia and has been on methadone 10 mg by mouth 3 times a day along with oxycodone when necessary for pain. We'll continue the same medications.   Diabetes mellitus  Patient wants to eat regular food, as she feels that she doesn't have much quality of life. We'll continue the Novolin 70/30 along with sliding scale insulin. CBG (last 3)   Recent Labs  03/03/14 0806 03/03/14 1206 03/03/14 1710  GLUCAP 218* 132* 129*       Constipation  Patient says that she had last BM last night after she was given an enema, will continue the MiraLAX and enemas when necessary.   Abdominal pain  Patient has history of chronic UTI with suprapubic catheter in place. CT bone pelvis negative for acute pathology. Likely combination of constipation and chronic UTI. We'll continue with the methadone and when necessary oxycodone.  Hypokalemia We'll replace the potassium Check BMP in the morning   Scalp wound  Most likely self-inflicted, as patient complains of skin tags hanging around for head. And she admits to scratching on the wound. We'll continue with the hydroxyzine when necessary for itching. Patient needs to follow up Dermatology as outpatient.   Code Status: DNR Family Communication: *Discussed with patient in detail Disposition Plan: SNF possibly on monday after colonoscopy.    Consultants:  Palliative care  Gastroenterology.   Procedures:  Colonoscopy planned for 3/5  Antibiotics:  None  HPI/Subjective: Patient seen and examined, feels better today. Hb is 6.3 today. Again refusing blood transfusion.  Objective: Filed  Vitals:   03/03/14 1400  BP: 117/73  Pulse: 72  Temp: 97.9 F (36.6 C)  Resp: 18    Intake/Output Summary (Last 24 hours) at 03/03/14 1755 Last data filed at 03/03/14 1400  Gross per 24 hour  Intake    723 ml  Output   4100 ml  Net  -3377 ml   Filed Weights   02/22/14  1710  Weight: 127.914 kg (282 lb)    Exam:  Physical Exam: Head: Normocephalic, atraumatic.  Eyes: No signs of jaundice, EOMI Nose: Mucous membranes dry.  Throat: Oropharynx nonerythematous, no exudate appreciated.  Neck: supple,No deformities, masses, or tenderness noted. Lungs: Normal respiratory effort. B/L Clear to auscultation, no crackles or wheezes.  Heart: Regular RR. S1 and S2 normal  Abdomen: BS normoactive. Soft, Nondistended, non-tender.  Extremities: No pretibial edema, no erythema   Data Reviewed: Basic Metabolic Panel:  Recent Labs Lab 03/03/14 0548  NA 141  K 3.9  CL 99  CO2 34*  GLUCOSE 199*  BUN 9  CREATININE 0.76  CALCIUM 8.8   Liver Function Tests: No results found for this basename: AST, ALT, ALKPHOS, BILITOT, PROT, ALBUMIN,  in the last 168 hours No results found for this basename: LIPASE, AMYLASE,  in the last 168 hours No results found for this basename: AMMONIA,  in the last 168 hours CBC:  Recent Labs Lab 02/27/14 0525 02/28/14 0425 03/01/14 0400 03/02/14 0518 03/03/14 0548  WBC 6.6 6.3 5.5 4.5 5.8  HGB 6.5* 6.6* 6.7* 6.3* 6.4*  HCT 24.0* 24.8* 25.1* 24.3* 23.4*  MCV 77.2* 77.5* 77.7* 77.6* 76.7*  PLT 221 231 252 247 259   Cardiac Enzymes: No results found for this basename: CKTOTAL, CKMB, CKMBINDEX, TROPONINI,  in the last 168 hours BNP (last 3 results) No results found for this basename: PROBNP,  in the last 8760 hours CBG:  Recent Labs Lab 03/02/14 1826 03/02/14 2229 03/03/14 0806 03/03/14 1206 03/03/14 1710  GLUCAP 107* 174* 218* 132* 129*    Recent Results (from the past 240 hour(s))  CULTURE, BLOOD (ROUTINE X 2)     Status: None   Collection Time    02/22/14  2:35 PM      Result Value Ref Range Status   Specimen Description BLOOD RIGHT HAND  5 ML IN [facility] BOTTLE   Final   Special Requests NONE   Final   Culture  Setup Time     Final   Value: 02/22/2014 16:40     Performed at [facility]   Culture      Final   Value: NO GROWTH 5 DAYS     Performed at [facility]   Report Status 02/28/2014 FINAL   Final  CULTURE, BLOOD (ROUTINE X 2)     Status: None   Collection Time    02/22/14  2:55 PM      Result Value Ref Range Status   Specimen Description BLOOD LEFT FOREARM  5 ML IN [facility] BOTTLE   Final   Special Requests NONE   Final   Culture  Setup Time     Final   Value: 02/22/2014 23:44     Performed at [facility]   Culture     Final   Value: NO GROWTH 5 DAYS     Performed at [facility]   Report Status 02/28/2014 FINAL   Final  URINE CULTURE     Status: None   Collection Time    02/22/14  5:33 PM      Result Value Ref Range Status   Specimen Description URINE, CATHETERIZED   Final   Special Requests NONE   Final   Culture  Setup Time     Final   Value: 02/23/2014 01:43     Performed at [facility]   Colony Count     Final   Value: >=100,000 COLONIES/ML     Performed at [facility]   Culture     Final   Value: PSEUDOMONAS AERUGINOSA     Performed at [facility]   Report Status 02/25/2014 FINAL   Final   Organism ID, Bacteria PSEUDOMONAS AERUGINOSA   Final  MRSA PCR SCREENING     Status: Abnormal   Collection Time    02/23/14 12:11 AM      Result Value Ref Range Status   MRSA by PCR POSITIVE (*) NEGATIVE Final   Comment:            The GeneXpert MRSA Assay (FDA     approved for NASAL specimens     only), is one component of a     comprehensive MRSA colonization     surveillance program. It is not     intended to diagnose MRSA     infection nor to guide or     monitor treatment for     MRSA infections.     RESULT CALLED TO, READ BACK BY AND VERIFIED WITH:     SPOKE WITH ASARO,J RN [phone] COVINGTON,N  URINE CULTURE     Status: None   Collection Time    03/01/14  3:00 PM      Result Value Ref Range Status   Specimen Description URINE, CLEAN CATCH   Final   Special Requests NONE   Final   Culture  Setup Time      Final   Value: 03/01/2014 21:20     Performed at [facility] Count     Final   Value: 50,000 COLONIES/ML     Performed at [facility]   Culture     Final   Value: Multiple bacterial morphotypes present, none predominant. Suggest appropriate recollection if clinically indicated.     Performed at [facility]   Report Status 03/03/2014 FINAL   Final     Studies: No results found.  Scheduled Meds: . buPROPion  300 mg Oral Daily  . cefTAZidime (FORTAZ)  IV  1 g Intravenous 3 times per day  . docusate sodium  100 mg Oral BID  . fluticasone  1 spray Each Nare Daily  . gabapentin  300 mg Oral BID  . hydrocortisone cream   Topical TID  . insulin aspart  0-9 Units Subcutaneous TID WC  . insulin aspart protamine- aspart  34 Units Subcutaneous BID WC  . levothyroxine  300 mcg Oral QAC breakfast  . loratadine  10 mg Oral Daily  . lubiprostone  24 mcg Oral BID WC  . methadone  30 mg Oral 3 times per day  . nystatin ointment   Topical BID  . oxybutynin  5 mg Oral TID  . polyvinyl alcohol  1 drop Both Eyes BID  . sodium chloride  3 mL Intravenous Q12H  . sucralfate  1 g Oral TID WC & HS  . tiZANidine  4 mg Oral QPM  . traZODone  100 mg Oral QHS  . venlafaxine XR  75 mg Oral Q breakfast  Continuous Infusions: . sodium chloride 20 mL/hr at 03/02/14 16100521    Principal Problem:   Urinary tract infection Active Problems:   Unspecified hereditary and idiopathic peripheral neuropathy   Chronic pain syndrome   HTN (hypertension)   Morbid obesity   Type II or unspecified type diabetes mellitus with peripheral circulatory disorders, uncontrolled(250.72)   Depression   Unspecified hypothyroidism   Unspecified constipation   COPD (chronic obstructive pulmonary disease)   Anemia, iron deficiency   Scalp lesion   Enlarged parotid gland   Inadequate material resources   DNR (do not resuscitate)    Time spent: 25 min    Abiel Antrim  [facility] Pager [phone]. If 7PM-7AM, please contact night-coverage at [email or web address], password [email or web address] 03/03/2014, 5:55 PM  LOS: 9 days

## 2014-03-03 NOTE — Progress Notes (Signed)
Patient ID: Karen BarrioChristy Walls, female   DOB: 12/29/1960, 53 y.o.   MRN: [id number] [facility]  [name] 53 y.o. 02/21/1961   Subjective: Nodding off sleeping when I walk in room and when aroused she starts complaining of diffuse abdominal pain. Denies refusing prep last week when Dr. Elnoria HowardHung tried to do the colonoscopy twice last week.  Objective: Vital signs: Filed Vitals:   03/03/14 0633  BP: 108/65  Pulse: 79  Temp: 98.4 F (36.9 C)  Resp: 18    Physical Exam: Gen: lethargic, uncomfortable, morbidly obese  Abd: diffuse tenderness with guarding, nondistended, soft, obese  Lab Results:  Recent Labs  03/03/14 0548  NA 141  K 3.9  CL 99  CO2 34*  GLUCOSE 199*  BUN 9  CREATININE 0.76  CALCIUM 8.8   No results found for this basename: AST, ALT, ALKPHOS, BILITOT, PROT, ALBUMIN,  in the last 72 hours  Recent Labs  03/02/14 0518 03/03/14 0548  WBC 4.5 5.8  HGB 6.3* 6.4*  HCT 24.3* 23.4*  MCV 77.6* 76.7*  PLT 247 259      Assessment/Plan: 53 yo with anemia and heme positive stools in need of EGD/Colon that has been cancelled twice because she refused the prep and signed off by Dr. Elnoria HowardHung who saw her initially the last week of Feb. She also has repeatedly refused blood transfusions. I was called to set up procedures when patient reports that she is willing to drink prep now. Defer her pain meds questions to the primary team. Will start prep today and if stools not clear after first gallon then will give an additional gallon of prep. Colonoscopy by Dr. Dulce Sellarutlaw Monday morning with propofol by anesthesiology.   Willona Phariss C. 03/03/2014, 11:53 AM

## 2014-03-04 LAB — CBC
HCT: 23.9 % — ABNORMAL LOW (ref 36.0–46.0)
HEMOGLOBIN: 6.4 g/dL — AB (ref 12.0–15.0)
MCH: 20.6 pg — ABNORMAL LOW (ref 26.0–34.0)
MCHC: 26.8 g/dL — ABNORMAL LOW (ref 30.0–36.0)
MCV: 76.8 fL — AB (ref 78.0–100.0)
Platelets: 230 10*3/uL (ref 150–400)
RBC: 3.11 MIL/uL — AB (ref 3.87–5.11)
RDW: 16.6 % — ABNORMAL HIGH (ref 11.5–15.5)
WBC: 7.4 10*3/uL (ref 4.0–10.5)

## 2014-03-04 LAB — GLUCOSE, CAPILLARY
GLUCOSE-CAPILLARY: 60 mg/dL — AB (ref 70–99)
GLUCOSE-CAPILLARY: 90 mg/dL (ref 70–99)
GLUCOSE-CAPILLARY: 90 mg/dL (ref 70–99)
Glucose-Capillary: 100 mg/dL — ABNORMAL HIGH (ref 70–99)
Glucose-Capillary: 80 mg/dL (ref 70–99)
Glucose-Capillary: 74 mg/dL (ref 70–99)
Glucose-Capillary: 82 mg/dL (ref 70–99)

## 2014-03-04 MED ORDER — DEXTROSE 50 % IV SOLN
INTRAVENOUS | Status: AC
  Filled 2014-03-04: qty 50

## 2014-03-04 MED ORDER — PEG 3350-KCL-NA BICARB-NACL 420 G PO SOLR
4000.0000 mL | Freq: Once | ORAL | Status: AC
  Administered 2014-03-04: 4000 mL via ORAL

## 2014-03-04 MED ORDER — LIDOCAINE HCL (CARDIAC) 20 MG/ML IV SOLN
INTRAVENOUS | Status: AC
  Filled 2014-03-04: qty 5

## 2014-03-04 MED ORDER — BISACODYL 5 MG PO TBEC
10.0000 mg | DELAYED_RELEASE_TABLET | Freq: Once | ORAL | Status: AC
  Administered 2014-03-04: 10 mg via ORAL
  Filled 2014-03-04: qty 2

## 2014-03-04 MED ORDER — MIDAZOLAM HCL 2 MG/2ML IJ SOLN
INTRAMUSCULAR | Status: AC
  Filled 2014-03-04: qty 2

## 2014-03-04 MED ORDER — DEXTROSE 50 % IV SOLN
25.0000 mL | Freq: Once | INTRAVENOUS | Status: AC | PRN
  Administered 2014-03-04: 25 mL via INTRAVENOUS

## 2014-03-04 MED ORDER — PROPOFOL 10 MG/ML IV BOLUS
INTRAVENOUS | Status: AC
  Filled 2014-03-04: qty 20

## 2014-03-04 MED ORDER — ONDANSETRON HCL 4 MG/2ML IJ SOLN
INTRAMUSCULAR | Status: AC
  Filled 2014-03-04: qty 2

## 2014-03-04 NOTE — Progress Notes (Signed)
Pt drank most of bowel prep but has not had anymore bowel movements after the first one. MD notified. Second tap water enema given per order. Patsey BertholdLemons, Lisanne Ponce King

## 2014-03-04 NOTE — Progress Notes (Signed)
Subjective: Bowel prep completed, but still having some solid stool.  Objective: Vital signs in last 24 hours: Temp:  [97.4 F (36.3 C)-99.4 F (37.4 C)] 99.4 F (37.4 C) (03/09 0641) Pulse Rate:  [69-87] 87 (03/09 0641) Resp:  [18-20] 20 (03/09 0641) BP: (117-136)/(65-73) 122/65 mmHg (03/09 0641) SpO2:  [95 %-99 %] 95 % (03/09 0641) Weight change:  Last BM Date: 02/28/14  PE: GEN:  Overweight, older-appearing than stated age, NAD ABD:  Protuberant, soft, non-tender  Lab Results: CBC    Component Value Date/Time   WBC 7.4 03/04/2014 0454   RBC 3.11* 03/04/2014 0454   HGB 6.4* 03/04/2014 0454   HCT 23.9* 03/04/2014 0454   PLT 230 03/04/2014 0454   MCV 76.8* 03/04/2014 0454   MCH 20.6* 03/04/2014 0454   MCHC 26.8* 03/04/2014 0454   RDW 16.6* 03/04/2014 0454   LYMPHSABS 1.5 02/22/2014 1350   MONOABS 0.7 02/22/2014 1350   EOSABS 0.1 02/22/2014 1350   BASOSABS 0.0 02/22/2014 1350   CMP     Component Value Date/Time   NA 141 03/03/2014 0548   K 3.9 03/03/2014 0548   CL 99 03/03/2014 0548   CO2 34* 03/03/2014 0548   GLUCOSE 199* 03/03/2014 0548   BUN 9 03/03/2014 0548   CREATININE 0.76 03/03/2014 0548   CALCIUM 8.8 03/03/2014 0548   PROT 6.4 02/24/2014 0530   ALBUMIN 2.7* 02/24/2014 0530   AST 20 02/24/2014 0530   ALT 37* 02/24/2014 0530   ALKPHOS 97 02/24/2014 0530   BILITOT 0.2* 02/24/2014 0530   GFRNONAA >90 03/03/2014 0548   GFRAA >90 03/03/2014 0548   Assessment:  1.  Anemia. 2.  Hemoccult-positive stool. 3.  Chronic pain syndrome on chronic narcotics. 4.  Chronic constipation, likely multifactorial (bedbound, narcotics, chronic medical illnesses).  Plan:  1.  Needs further bowel preparation before we're able to proceed with her endoscopic procedures.  She is frustrated, but understands the rationale. 2.  Clear liquids again today, NPO after midnight. 3.  [facility] will follow.   Karen JakschOUTLAW,[name] 03/04/2014, 9:41 AM

## 2014-03-04 NOTE — Progress Notes (Signed)
[facility] PROGRESS NOTE  [name] UJW:[id number] DOB: 08/21/1961 DOA: 02/22/2014 PCP: Terald Sleeper, MD  Assessment/Plan:  UTI  Patient has history of chronic UTI and has a suprapubic catheter in place.  Was started on IV Fortaz. Urine culture came back and pseudomonas is sensitive to Cipro.but patient is refusing to take oral ciprofloxacin, hence we will continue with IV fortaz. Discussed with pharmacy.    Anemia  Does not want blood transfusion Secondary to GI bleed, patient has guaiac positive stools.  Earlier patient had refused  for endoscopy and colonoscopy, but now wants to have colonoscopy, called and discussed with Dr Elnoria Howard, who put her on the schedule on 3/5. But she refused to drink the prep and the colonoscopy on 3/5 was cancelled. Dr Elnoria Howard signed off,. Pt became all teary eyed and reported that she doesn't know that she has to drink the prep/golytely. She promised to drink the prep and go ahead with the colonoscopy. Requested Dr schooler who is on call , to see if can be done. Since she is on a lot of pain medications and narcotics, she might need propofol and she is scheduled for  [place]oscopy in [place] , on Tuesday, as she continues to have solid BM'S despite drnking the golytely. Spoke to the RN on holding pain medications.   Hb is 6.3 today, patient does not want blood transfusion.  Left parotid swelling CT maxillofacial was done which showed mildly prominent lymph nodes over the left parotid gland without associated inflammatory change, no evidence of abscess findings may be seen in nonspecific parotitis, which could be inflammatory versus infectious etiology. At this time no acute issue, can follow up ENT as outpatient.  Depression  Patient is severely depressed, has been crying throughout the interview. She also has lot of anger against her partner, and also is very unhappy with the care provided at the nursing facility. Psychiatry  consulted to adjust the  patient's medications for depression. We'll continue patient's home regimen for depression including Effexor, Wellbutrin.    Chronic pain syndrome  Patient has history of Lyme disease, fibromyalgia and has been on methadone 10 mg by mouth 3 times a day along with oxycodone when necessary for pain. We'll continue the same medications.   Diabetes mellitus  Patient wants to eat regular food, as she feels that she doesn't have much quality of life. We'll continue the Novolin 70/30 along with sliding scale insulin. CBG (last 3)   Recent Labs  03/04/14 0821 03/04/14 1135 03/04/14 1547  GLUCAP 100* 80 74       Constipation  Patient says that she had last BM last night after she was given an enema, will continue the MiraLAX and enemas when necessary.   Abdominal pain  Patient has history of chronic UTI with suprapubic catheter in place. CT bone pelvis negative for acute pathology. Likely combination of constipation and chronic UTI. We'll continue with the methadone and when necessary oxycodone.  Hypokalemia We'll replace the potassium Check BMP in the morning   Scalp wound  Most likely self-inflicted, as patient complains of skin tags hanging around for head. And she admits to scratching on the wound. We'll continue with the hydroxyzine when necessary for itching. Patient needs to follow up Dermatology as outpatient.   Code Status: DNR Family Communication: *Discussed with patient in detail Disposition Plan: SNF possibly on wednesday after colonoscopy.    Consultants:  Palliative care  Gastroenterology.   Procedures:  Colonoscopy planned for 3/5  Antibiotics:  None  HPI/Subjective: Pt sleepy Hb is 6.4 today. Again refusing blood transfusion.  Objective: Filed Vitals:   03/04/14 1359  BP: 104/66  Pulse: 69  Temp: 98.5 F (36.9 C)  Resp: 18    Intake/Output Summary (Last 24 hours) at 03/04/14 1728 Last data filed at 03/04/14 1400  Gross per 24 hour  Intake    3280 ml  Output   4450 ml  Net  -1170 ml   Filed Weights   02/22/14 1710  Weight: 127.914 kg (282 lb)    Exam:  Physical Exam: very sleepy Head: Normocephalic, atraumatic.  Eyes: No signs of jaundice, EOMI Nose: Mucous membranes dry.  Throat: Oropharynx nonerythematous, no exudate appreciated.  Neck: supple,No deformities, masses, or tenderness noted. Lungs: Normal respiratory effort. B/L Clear to auscultation, no crackles or wheezes.  Heart: Regular RR. S1 and S2 normal  Abdomen: BS normoactive. Soft, Nondistended, non-tender.  Extremities: No pretibial edema, no erythema   Data Reviewed: Basic Metabolic Panel:  Recent Labs Lab 03/03/14 0548  NA 141  K 3.9  CL 99  CO2 34*  GLUCOSE 199*  BUN 9  CREATININE 0.76  CALCIUM 8.8   Liver Function Tests: No results found for this basename: AST, ALT, ALKPHOS, BILITOT, PROT, ALBUMIN,  in the last 168 hours No results found for this basename: LIPASE, AMYLASE,  in the last 168 hours No results found for this basename: AMMONIA,  in the last 168 hours CBC:  Recent Labs Lab 02/28/14 0425 03/01/14 0400 03/02/14 0518 03/03/14 0548 03/04/14 0454  WBC 6.3 5.5 4.5 5.8 7.4  HGB 6.6* 6.7* 6.3* 6.4* 6.4*  HCT 24.8* 25.1* 24.3* 23.4* 23.9*  MCV 77.5* 77.7* 77.6* 76.7* 76.8*  PLT 231 252 247 259 230   Cardiac Enzymes: No results found for this basename: CKTOTAL, CKMB, CKMBINDEX, TROPONINI,  in the last 168 hours BNP (last 3 results) No results found for this basename: PROBNP,  in the last 8760 hours CBG:  Recent Labs Lab 03/04/14 0150 03/04/14 0222 03/04/14 0821 03/04/14 1135 03/04/14 1547  GLUCAP 60* 90 100* 80 74    Recent Results (from the past 240 hour(s))  URINE CULTURE     Status: None   Collection Time    02/22/14  5:33 PM      Result Value Ref Range Status   Specimen Description URINE, CATHETERIZED   Final   Special Requests NONE   Final   Culture  Setup Time     Final   Value: 02/23/2014 01:43      Performed at [facility] Count     Final   Value: >=100,000 COLONIES/ML     Performed at [facility]   Culture     Final   Value: PSEUDOMONAS AERUGINOSA     Performed at [facility]   Report Status 02/25/2014 FINAL   Final   Organism ID, Bacteria PSEUDOMONAS AERUGINOSA   Final  MRSA PCR SCREENING     Status: Abnormal   Collection Time    02/23/14 12:11 AM      Result Value Ref Range Status   MRSA by PCR POSITIVE (*) NEGATIVE Final   Comment:            The GeneXpert MRSA Assay (FDA     approved for NASAL specimens     only), is one component of a     comprehensive MRSA colonization     surveillance program. It is not     intended  to diagnose MRSA     infection nor to guide or     monitor treatment for     MRSA infections.     RESULT CALLED TO, READ BACK BY AND VERIFIED WITH:     SPOKE WITH ASARO,J RN [phone] COVINGTON,N  URINE CULTURE     Status: None   Collection Time    03/01/14  3:00 PM      Result Value Ref Range Status   Specimen Description URINE, CLEAN CATCH   Final   Special Requests NONE   Final   Culture  Setup Time     Final   Value: 03/01/2014 21:20     [facility]   Colony Count     Final   Value: 50,000 COLONIES/ML     [facility]   Culture     Final   Value: Multiple bacterial morphotypes present, none predominant. Suggest appropriate recollection if clinically indicated.     [facility]   Report Status 03/03/2014 FINAL   Final     Studies: No results found.  Scheduled Meds: . bisacodyl  10 mg Oral Once  . buPROPion  300 mg Oral Daily  . cefTAZidime (FORTAZ)  IV  1 g Intravenous 3 times per day  . docusate sodium  100 mg Oral BID  . fluticasone  1 spray Each Nare Daily  . gabapentin  300 mg Oral BID  . hydrocortisone cream   Topical TID  . insulin aspart  0-9 Units Subcutaneous TID WC  . insulin aspart protamine- aspart  34 Units Subcutaneous  BID WC  . levothyroxine  300 mcg Oral QAC breakfast  . loratadine  10 mg Oral Daily  . lubiprostone  24 mcg Oral BID WC  . methadone  30 mg Oral 3 times per day  . nystatin ointment   Topical BID  . oxybutynin  5 mg Oral TID  . polyvinyl alcohol  1 drop Both Eyes BID  . sodium chloride  3 mL Intravenous Q12H  . sucralfate  1 g Oral TID WC & HS  . tiZANidine  4 mg Oral QPM  . traZODone  100 mg Oral QHS  . venlafaxine XR  75 mg Oral Q breakfast   Continuous Infusions: . sodium chloride 20 mL/hr at 03/02/14 [id number]    Principal Problem:   Urinary tract infection Active Problems:   Unspecified hereditary and idiopathic peripheral neuropathy   Chronic pain syndrome   HTN (hypertension)   Morbid obesity   Type II or unspecified type diabetes mellitus with peripheral circulatory disorders, uncontrolled(250.72)   Depression   Unspecified hypothyroidism   Unspecified constipation   COPD (chronic obstructive pulmonary disease)   Anemia, iron deficiency   Scalp lesion   Enlarged parotid gland   Inadequate material resources   DNR (do not resuscitate)    Time spent: 25 min    [facility] LLCKULA,Mileidy Atkin  [facility] Pager [phone]. If 7PM-7AM, please contact night-coverage at [email or web address], password [email or web address] 03/04/2014, 5:28 PM  LOS: 10 days
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
## 2014-03-04 NOTE — Progress Notes (Signed)
At 2330 pt only had half gallon of bowel prep drunk. Pt was too sleepy to stay awake to drink. Pt had not had bm yet. md notified. Tap water enema ordered. Enema was given and pt had a large mostly formed medium brown stool. Pt encouraged to continue drinking prep. Will continue to monitor.  [name], [name]

## 2014-03-04 NOTE — Progress Notes (Signed)
Hypoglycemic Event  CBG: 60  Treatment: D50   Symptoms: nausea/hungry  Follow-up CBG: Time:0220 CBG Result:90  Possible Reasons for Event: Pt npo  Comments/MD notified: Schorr,  NP notifed    Patsey BertholdLemons, Daevion Navarette King  Remember to initiate Hypoglycemia Order Set & complete

## 2014-03-04 NOTE — Progress Notes (Signed)
Patient has been very upset and discouraged today. She is frustrated that she can't seem to get better and concerned about several "issues that haven't been resolved". Sat with patient for about 20 minutes and listened as well as explained why several procedures were being done, medications were recommended, etc. She is mostly concerned about her scalp wound. She has been using the hydrocortisone cream, and I gave her an ice pack to help with itching, swelling, and headache. Patient would like to speak to MD more about skin issues.  Vital signs stable, will continue to monitor.

## 2014-03-04 NOTE — Progress Notes (Signed)
Dr Dulce Sellarutlaw notified of pts prep results. md will come see pt. Colonoscopy will not be done today.  Patsey BertholdLemons, Karsin Pesta King

## 2014-03-05 ENCOUNTER — Encounter (HOSPITAL_COMMUNITY)
Admission: EM | Disposition: A | Discharge status: Skilled Nursing Facility | Payer: Self-pay | Source: Home / Self Care | Attending: Internal Medicine

## 2014-03-05 ENCOUNTER — Encounter (HOSPITAL_COMMUNITY): Payer: Medicare Other | Admitting: Anesthesiology

## 2014-03-05 ENCOUNTER — Inpatient Hospital Stay (HOSPITAL_COMMUNITY): Payer: Medicare Other | Admitting: Anesthesiology

## 2014-03-05 ENCOUNTER — Encounter (HOSPITAL_COMMUNITY): Payer: Self-pay | Admitting: *Deleted

## 2014-03-05 HISTORY — PX: COLONOSCOPY WITH PROPOFOL: SHX5780

## 2014-03-05 HISTORY — PX: ESOPHAGOGASTRODUODENOSCOPY (EGD) WITH PROPOFOL: SHX5813

## 2014-03-05 LAB — GLUCOSE, CAPILLARY
GLUCOSE-CAPILLARY: 78 mg/dL (ref 70–99)
Glucose-Capillary: 78 mg/dL (ref 70–99)
Glucose-Capillary: 82 mg/dL (ref 70–99)
Glucose-Capillary: 80 mg/dL (ref 70–99)
Glucose-Capillary: 115 mg/dL — ABNORMAL HIGH (ref 70–99)

## 2014-03-05 LAB — CBC
HCT: 22 % — ABNORMAL LOW (ref 36.0–46.0)
HEMOGLOBIN: 6 g/dL — AB (ref 12.0–15.0)
MCH: 20.7 pg — ABNORMAL LOW (ref 26.0–34.0)
MCHC: 27.3 g/dL — ABNORMAL LOW (ref 30.0–36.0)
MCV: 75.9 fL — ABNORMAL LOW (ref 78.0–100.0)
Platelets: 206 10*3/uL (ref 150–400)
RBC: 2.9 MIL/uL — AB (ref 3.87–5.11)
RDW: 16.6 % — ABNORMAL HIGH (ref 11.5–15.5)
WBC: 4.8 10*3/uL (ref 4.0–10.5)

## 2014-03-05 SURGERY — ESOPHAGOGASTRODUODENOSCOPY (EGD) WITH PROPOFOL
Anesthesia: Monitor Anesthesia Care

## 2014-03-05 MED ORDER — PEG 3350-KCL-NA BICARB-NACL 420 G PO SOLR: 4000.0000 mL | Freq: Once | ORAL | Status: DC

## 2014-03-05 MED ORDER — MIDAZOLAM HCL 2 MG/2ML IJ SOLN
INTRAMUSCULAR | Status: AC
  Filled 2014-03-05: qty 2

## 2014-03-05 MED ORDER — PROPOFOL 10 MG/ML IV BOLUS
INTRAVENOUS | Status: AC
  Filled 2014-03-05: qty 20

## 2014-03-05 MED ORDER — LACTATED RINGERS IV SOLN
INTRAVENOUS | Status: DC | PRN
  Administered 2014-03-05: 10:00:00 via INTRAVENOUS

## 2014-03-05 MED ORDER — INSULIN ASPART PROT & ASPART (70-30 MIX) 100 UNIT/ML ~~LOC~~ SUSP
17.0000 [IU] | Freq: Two times a day (BID) | SUBCUTANEOUS | Status: DC
  Administered 2014-03-06 – 2014-03-10 (×6): 17 [IU] via SUBCUTANEOUS
  Filled 2014-03-05 (×2): qty 10

## 2014-03-05 MED ORDER — MIDAZOLAM HCL 5 MG/5ML IJ SOLN
INTRAMUSCULAR | Status: DC | PRN
  Administered 2014-03-05: 1 mg via INTRAVENOUS

## 2014-03-05 MED ORDER — KETAMINE HCL 10 MG/ML IJ SOLN
INTRAMUSCULAR | Status: AC
  Filled 2014-03-05: qty 1

## 2014-03-05 MED ORDER — FENTANYL CITRATE 0.05 MG/ML IJ SOLN
INTRAMUSCULAR | Status: AC
  Filled 2014-03-05: qty 2

## 2014-03-05 MED ORDER — PROPOFOL INFUSION 10 MG/ML OPTIME
INTRAVENOUS | Status: DC | PRN
  Administered 2014-03-05: 100 ug/kg/min via INTRAVENOUS

## 2014-03-05 MED ORDER — DOCUSATE SODIUM 100 MG PO CAPS
200.0000 mg | ORAL_CAPSULE | Freq: Two times a day (BID) | ORAL | Status: DC
  Administered 2014-03-05 – 2014-03-11 (×12): 200 mg via ORAL
  Filled 2014-03-05 (×13): qty 2

## 2014-03-05 MED ORDER — ONDANSETRON HCL 4 MG/2ML IJ SOLN
INTRAMUSCULAR | Status: AC
  Filled 2014-03-05: qty 2

## 2014-03-05 MED ORDER — FENTANYL CITRATE 0.05 MG/ML IJ SOLN
INTRAMUSCULAR | Status: DC | PRN
  Administered 2014-03-05 (×2): 50 ug via INTRAVENOUS

## 2014-03-05 MED ORDER — KETAMINE HCL 10 MG/ML IJ SOLN
INTRAMUSCULAR | Status: DC | PRN
  Administered 2014-03-05: 25 mg via INTRAVENOUS

## 2014-03-05 MED ORDER — BISACODYL 5 MG PO TBEC
10.0000 mg | DELAYED_RELEASE_TABLET | Freq: Every day | ORAL | Status: DC | PRN
  Administered 2014-03-05: 10 mg via ORAL
  Filled 2014-03-05: qty 2

## 2014-03-05 MED ORDER — BISACODYL 5 MG PO TBEC: 10.0000 mg | DELAYED_RELEASE_TABLET | Freq: Once | ORAL | Status: DC

## 2014-03-05 MED ORDER — SODIUM CHLORIDE 0.9 % IV SOLN: INTRAVENOUS | Status: DC

## 2014-03-05 SURGICAL SUPPLY — 24 items

## 2014-03-05 NOTE — Progress Notes (Addendum)
[facility] PROGRESS NOTE  [name] WJX:[id number] DOB: Mar 09, 1961 DOA: 02/22/2014 PCP: Terald Sleeper, MD Interim summary: 53 year old female with multiple medical problems who is currently residing at skilled nursing facility was brought to the hospital with fever and generalized weakness. In the ED, patient was found to have abnormal UA as well as positive stool for occult blood. She also found to have hemoglobin of 7.6, she received one dose of Fortaz in the ED. CT scan of the abdomen was done, which showed abundant colonic stool and no other significant abnormality. She was started on IV fortaz later on transitioned to oral ciprofloxacin on 3/4. She refused to take ciprofloxacin and reported that oral antibiotics would not work. It was later on changed to IV fortaz. She currently completed 8 days of IV fortaz for the treatment of the pseudomonas UTI. Repeat Urine cultures from the supra pubic catheter showed only 50,000 colonies with multiple bacterial morphotypes. She remained afebrile and WBC count is normal.   In view of her anemia and heme positive stools, GI consulted initially , Dr Elnoria Howard saw pt and recommended colonoscopy. But she stated that she wanted a few weeks to think about it, that's when Dr Elnoria Howard signed off. Then next day she reported she wanted to get the procedure done . Dr Elnoria Howard was called back and he was kind enough to put her on schedule for 3/5 after bowel brep. She did not drink her bowel prep on the night of 3/4 and colonoscopy was cancelled and Dr Elnoria Howard signed off. Dr Bosie Clos was on call and I have requested him to see if we can put her on schedule for the procedure.  Since she is on a lot of pain medications and narcotics, she might need propofol and she is scheduled for  [place] in [place] , on Monday. But she continued to have solid bm's and Gi was uable to proceed with her endoscopic procedures. Her bowel prep was continued on Monday and she was scheduled on  Tuesday. She went to OR, underwent EGD, which showed gastritis. Colonoscopy was incomplete because of poor prep, despite 2 gallons of golytely and tap water enemas and two days of clear liquid diet. Dr Dulce Sellar recommended clear liquids and another gallon of golytely today with colonoscopy scheduled in am. But patient is upset about the fact that she did not have  Solid meals for 2 days in a row and wanted to eat solid food.    Meanwhile because of her h/o depression,  psychiatry consulted recommended. No medication changes. Palliative care consulted for goals of care on 2/28. She is DNR. But she continues to refuse a blood transfusion.   Assessment/Plan:  UTI  Patient has history of chronic UTI and has a suprapubic catheter in place.  Completed 8 days of IV fortaz.   Anemia  Does not want blood transfusion. Possibly, Secondary to GI bleed, patient has guaiac positive stools.  Earlier patient had refused  for endoscopy and colonoscopy, but now wants to have colonoscopy, called and discussed with Dr Elnoria Howard, who put her on the schedule on 3/5. But she refused to drink the prep and the colonoscopy on 3/5 was cancelled. Dr Elnoria Howard signed off,. Pt became all teary eyed and reported that she doesn't know that she has to drink the prep/golytely. She promised to drink the prep and go ahead with the colonoscopy. Requested Dr schooler who is on call , to see if can be done. Since she is on a lot of pain  medications and narcotics, she might need propofol and she is scheduled for  [place]oscopy in [place] , on wednesday as she continues to have solid BM'S despite drnking the golytely. Spoke to the RN on holding pain medications.   Hb is 6 today, patient does not want blood transfusion.  Left parotid swelling CT maxillofacial was done which showed mildly prominent lymph nodes over the left parotid gland without associated inflammatory change, no evidence of abscess findings may be seen in nonspecific parotitis, which could be  inflammatory versus infectious etiology. At this time no acute issue, can follow up ENT as outpatient.  Depression  Patient is severely depressed, .  She also has lot of anger against her partner, and also is very unhappy with the care provided at the nursing facility. Psychiatry  consulted to adjust the patient's medications for depression. We'll continue patient's home regimen for depression including Effexor, Wellbutrin.    Chronic pain syndrome  Patient has history of Lyme disease, fibromyalgia and has been on methadone 10 mg by mouth 3 times a day along with oxycodone when necessary for pain.  Diabetes mellitus  Patient wants to eat regular food, as she feels that she doesn't have much quality of life. Decreased the dose of  the Novolin 70/30 along with sliding scale insulin. CBG (last 3)   Recent Labs  03/04/14 2129 03/05/14 0717 03/05/14 1146  GLUCAP 82 78 78       Constipation  Will start her on colace as she requested in addition to the golytely.   Abdominal pain  Patient has history of chronic UTI with suprapubic catheter in place. CT bone pelvis negative for acute pathology. Likely combination of constipation and chronic UTI. We'll continue with the methadone and when necessary oxycodone.  Hypokalemia Repleted.    Scalp wound  Most likely self-inflicted, as patient complains of skin tags hanging around for head. And she admits to scratching on the wound. We'll continue with the hydroxyzine when necessary for itching. Patient needs to follow up Dermatology as outpatient.   Hypothyroidism; Continue with synthroid. TSH abnormal. But free t4 normal.   Code Status: DNR Family Communication: Discussed with patient and her parents at bedside in detail Disposition Plan: SNF possibly on wednesday after colonoscopy.    Consultants:  Palliative care  Gastroenterology.  Dr Dulce Sellaroutlaw  Psychiatry.   Procedures:  Colonoscopy planned for 3/11  Antibiotics:  Completed  the course of IV fortaz  HPI/Subjective: Very upset that she was not given ENEMAS last night. Wanted solid food and wants to postpone the colonoscopy.   Objective: Filed Vitals:   03/05/14 1412  BP: 112/69  Pulse: 88  Temp: 98 F (36.7 C)  Resp: 24    Intake/Output Summary (Last 24 hours) at 03/05/14 1659 Last data filed at 03/05/14 1444  Gross per 24 hour  Intake    530 ml  Output   3900 ml  Net  -3370 ml   Filed Weights   02/22/14 1710  Weight: 127.914 kg (282 lb)    Exam:  Physical Exam: wake and alert.  Eyes: No signs of jaundice, EOMI Nose: Mucous membranes moist.  Throat: Oropharynx nonerythematous, no exudate appreciated.  Neck: supple,No deformities, masses, or tenderness noted. Lungs: Normal respiratory effort. B/L Clear to auscultation, no crackles or wheezes.  Heart: Regular RR. S1 and S2 normal  Abdomen: BS normoactive. Soft, Nondistended, non-tender.  Extremities: No pretibial edema, no erythema   Data Reviewed: Basic Metabolic Panel:  Recent Labs Lab 03/03/14 0548  NA 141  K 3.9  CL 99  CO2 34*  GLUCOSE 199*  BUN 9  CREATININE 0.76  CALCIUM 8.8   Liver Function Tests: No results found for this basename: AST, ALT, ALKPHOS, BILITOT, PROT, ALBUMIN,  in the last 168 hours No results found for this basename: LIPASE, AMYLASE,  in the last 168 hours No results found for this basename: AMMONIA,  in the last 168 hours CBC:  Recent Labs Lab 03/01/14 0400 03/02/14 0518 03/03/14 0548 03/04/14 0454 03/05/14 0440  WBC 5.5 4.5 5.8 7.4 4.8  HGB 6.7* 6.3* 6.4* 6.4* 6.0*  HCT 25.1* 24.3* 23.4* 23.9* 22.0*  MCV 77.7* 77.6* 76.7* 76.8* 75.9*  PLT 252 247 259 230 206   Cardiac Enzymes: No results found for this basename: CKTOTAL, CKMB, CKMBINDEX, TROPONINI,  in the last 168 hours BNP (last 3 results) No results found for this basename: PROBNP,  in the last 8760 hours CBG:  Recent Labs Lab 03/04/14 1547 03/04/14 1710 03/04/14 2129  03/05/14 0717 03/05/14 1146  GLUCAP 74 90 82 78 78    Recent Results (from the past 240 hour(s))  URINE CULTURE     Status: None   Collection Time    03/01/14  3:00 PM      Result Value Ref Range Status   Specimen Description URINE, CLEAN CATCH   Final   Special Requests NONE   Final   Culture  Setup Time     Final   Value: 03/01/2014 21:20     Performed at [facility] Count     Final   Value: 50,000 COLONIES/ML     Performed at [facility]   Culture     Final   Value: Multiple bacterial morphotypes present, none predominant. Suggest appropriate recollection if clinically indicated.     Performed at [facility]   Report Status 03/03/2014 FINAL   Final     Studies: No results found.  Scheduled Meds: . bisacodyl  10 mg Oral Once  . buPROPion  300 mg Oral Daily  . cefTAZidime (FORTAZ)  IV  1 g Intravenous 3 times per day  . docusate sodium  100 mg Oral BID  . fluticasone  1 spray Each Nare Daily  . gabapentin  300 mg Oral BID  . hydrocortisone cream   Topical TID  . insulin aspart  0-9 Units Subcutaneous TID WC  . insulin aspart protamine- aspart  34 Units Subcutaneous BID WC  . levothyroxine  300 mcg Oral QAC breakfast  . loratadine  10 mg Oral Daily  . lubiprostone  24 mcg Oral BID WC  . methadone  30 mg Oral 3 times per day  . nystatin ointment   Topical BID  . oxybutynin  5 mg Oral TID  . polyethylene glycol-electrolytes  4,000 mL Oral Once  . polyvinyl alcohol  1 drop Both Eyes BID  . sodium chloride  3 mL Intravenous Q12H  . sucralfate  1 g Oral TID WC & HS  . tiZANidine  4 mg Oral QPM  . traZODone  100 mg Oral QHS  . venlafaxine XR  75 mg Oral Q breakfast   Continuous Infusions: . sodium chloride 20 mL/hr at 03/05/14 1211    Principal Problem:   Urinary tract infection Active Problems:   Unspecified hereditary and idiopathic peripheral neuropathy   Chronic pain syndrome   HTN (hypertension)   Morbid obesity   Type  II or unspecified type diabetes mellitus with  peripheral circulatory disorders, uncontrolled(250.72)   Depression   Unspecified hypothyroidism   Unspecified constipation   COPD (chronic obstructive pulmonary disease)   Anemia, iron deficiency   Scalp lesion   Enlarged parotid gland   Inadequate material resources   DNR (do not resuscitate)    Time spent: 25 min    [facility]  [facility] Pager [phone]. If 7PM-7AM, please contact night-coverage at [email or web address], password [facility] 03/05/2014, 4:59 PM  LOS: 11 days

## 2014-03-05 NOTE — Interval H&P Note (Signed)
History and Physical Interval Note:  03/05/2014 10:18 AM  Karen BarrioChristy Pflug  has presented today for surgery, with the diagnosis of bleed  The various methods of treatment have been discussed with the patient and family. After consideration of risks, benefits and other options for treatment, the patient has consented to  Procedure(s): ESOPHAGOGASTRODUODENOSCOPY (EGD) WITH PROPOFOL (N/A) COLONOSCOPY WITH PROPOFOL (N/A) as a surgical intervention .  The patient's history has been reviewed, patient examined, no change in status, stable for surgery.  I have reviewed the patient's chart and labs.  Questions were answered to the patient's satisfaction.     Somtochukwu Woollard M  Assessment:  1.  Anemia. 2.  Hemoccult-positive stools. 3.  Multiple comorbidities, including suprapubic catheter and chronic pain syndrome.  Plan:  1.  Endoscopy. 2.  Risks (bleeding, infection, bowel perforation that could require surgery, sedation-related changes in cardiopulmonary systems), benefits (identification and possible treatment of source of symptoms, exclusion of certain causes of symptoms), and alternatives (watchful waiting, radiographic imaging studies, empiric medical treatment) of upper endoscopy (EGD) were explained to patient/family in detail and patient wishes to proceed. 3.  Colonoscopy. 4.  Risks (bleeding, infection, bowel perforation that could require surgery, sedation-related changes in cardiopulmonary systems), benefits (identification and possible treatment of source of symptoms, exclusion of certain causes of symptoms), and alternatives (watchful waiting, radiographic imaging studies, empiric medical treatment) of colonoscopy were explained to patient/family in detail and patient wishes to proceed.

## 2014-03-05 NOTE — Transfer of Care (Signed)
Immediate Anesthesia Transfer of Care Note  Patient: Karen BarrioChristy Walls  Procedure(s) Performed: Procedure(s): ESOPHAGOGASTRODUODENOSCOPY (EGD) WITH PROPOFOL (N/A) COLONOSCOPY WITH PROPOFOL (N/A)  Patient Location: PACU  Anesthesia Type:MAC  Level of Consciousness: awake, alert , oriented and patient cooperative  Airway & Oxygen Therapy: Patient Spontanous Breathing and Patient connected to face mask oxygen  Post-op Assessment: Report given to PACU RN and Post -op Vital signs reviewed and stable  Post vital signs: stable  Complications: No apparent anesthesia complications

## 2014-03-05 NOTE — H&P (View-Only) (Signed)
[facility] PROGRESS NOTE  [name] UJW:[id number] DOB: 08/21/1961 DOA: 02/22/2014 PCP: Terald Sleeper, MD  Assessment/Plan:  UTI  Patient has history of chronic UTI and has a suprapubic catheter in place.  Was started on IV Fortaz. Urine culture came back and pseudomonas is sensitive to Cipro.but patient is refusing to take oral ciprofloxacin, hence we will continue with IV fortaz. Discussed with pharmacy.    Anemia  Does not want blood transfusion Secondary to GI bleed, patient has guaiac positive stools.  Earlier patient had refused  for endoscopy and colonoscopy, but now wants to have colonoscopy, called and discussed with Dr Elnoria Howard, who put her on the schedule on 3/5. But she refused to drink the prep and the colonoscopy on 3/5 was cancelled. Dr Elnoria Howard signed off,. Pt became all teary eyed and reported that she doesn't know that she has to drink the prep/golytely. She promised to drink the prep and go ahead with the colonoscopy. Requested Dr schooler who is on call , to see if can be done. Since she is on a lot of pain medications and narcotics, she might need propofol and she is scheduled for  [place] , on [place], as she continues to have solid BM'S despite drnking the golytely. Spoke to the RN on holding pain medications.   Hb is 6.3 today, patient does not want blood transfusion.  Left parotid swelling CT maxillofacial was done which showed mildly prominent lymph nodes over the left parotid gland without associated inflammatory change, no evidence of abscess findings may be seen in nonspecific parotitis, which could be inflammatory versus infectious etiology. At this time no acute issue, can follow up ENT as outpatient.  Depression  Patient is severely depressed, has been crying throughout the interview. She also has lot of anger against her partner, and also is very unhappy with the care provided at the nursing facility. Psychiatry  consulted to adjust the  patient's medications for depression. We'll continue patient's home regimen for depression including Effexor, Wellbutrin.    Chronic pain syndrome  Patient has history of Lyme disease, fibromyalgia and has been on methadone 10 mg by mouth 3 times a day along with oxycodone when necessary for pain. We'll continue the same medications.   Diabetes mellitus  Patient wants to eat regular food, as she feels that she doesn't have much quality of life. We'll continue the Novolin 70/30 along with sliding scale insulin. CBG (last 3)   Recent Labs  03/04/14 0821 03/04/14 1135 03/04/14 1547  GLUCAP 100* 80 74       Constipation  Patient says that she had last BM last night after she was given an enema, will continue the MiraLAX and enemas when necessary.   Abdominal pain  Patient has history of chronic UTI with suprapubic catheter in place. CT bone pelvis negative for acute pathology. Likely combination of constipation and chronic UTI. We'll continue with the methadone and when necessary oxycodone.  Hypokalemia We'll replace the potassium Check BMP in the morning   Scalp wound  Most likely self-inflicted, as patient complains of skin tags hanging around for head. And she admits to scratching on the wound. We'll continue with the hydroxyzine when necessary for itching. Patient needs to follow up Dermatology as outpatient.   Code Status: DNR Family Communication: *Discussed with patient in detail Disposition Plan: SNF possibly on wednesday after colonoscopy.    Consultants:  Palliative care  Gastroenterology.   Procedures:  Colonoscopy planned for 3/5  Antibiotics:  None  HPI/Subjective: Pt sleepy Hb is 6.4 today. Again refusing blood transfusion.  Objective: Filed Vitals:   03/04/14 1359  BP: 104/66  Pulse: 69  Temp: 98.5 F (36.9 C)  Resp: 18    Intake/Output Summary (Last 24 hours) at 03/04/14 1728 Last data filed at 03/04/14 1400  Gross per 24 hour  Intake    3280 ml  Output   4450 ml  Net  -1170 ml   Filed Weights   02/22/14 1710  Weight: 127.914 kg (282 lb)    Exam:  Physical Exam: very sleepy Head: Normocephalic, atraumatic.  Eyes: No signs of jaundice, EOMI Nose: Mucous membranes dry.  Throat: Oropharynx nonerythematous, no exudate appreciated.  Neck: supple,No deformities, masses, or tenderness noted. Lungs: Normal respiratory effort. B/L Clear to auscultation, no crackles or wheezes.  Heart: Regular RR. S1 and S2 normal  Abdomen: BS normoactive. Soft, Nondistended, non-tender.  Extremities: No pretibial edema, no erythema   Data Reviewed: Basic Metabolic Panel:  Recent Labs Lab 03/03/14 0548  NA 141  K 3.9  CL 99  CO2 34*  GLUCOSE 199*  BUN 9  CREATININE 0.76  CALCIUM 8.8   Liver Function Tests: No results found for this basename: AST, ALT, ALKPHOS, BILITOT, PROT, ALBUMIN,  in the last 168 hours No results found for this basename: LIPASE, AMYLASE,  in the last 168 hours No results found for this basename: AMMONIA,  in the last 168 hours CBC:  Recent Labs Lab 02/28/14 0425 03/01/14 0400 03/02/14 0518 03/03/14 0548 03/04/14 0454  WBC 6.3 5.5 4.5 5.8 7.4  HGB 6.6* 6.7* 6.3* 6.4* 6.4*  HCT 24.8* 25.1* 24.3* 23.4* 23.9*  MCV 77.5* 77.7* 77.6* 76.7* 76.8*  PLT 231 252 247 259 230   Cardiac Enzymes: No results found for this basename: CKTOTAL, CKMB, CKMBINDEX, TROPONINI,  in the last 168 hours BNP (last 3 results) No results found for this basename: PROBNP,  in the last 8760 hours CBG:  Recent Labs Lab 03/04/14 0150 03/04/14 0222 03/04/14 0821 03/04/14 1135 03/04/14 1547  GLUCAP 60* 90 100* 80 74    Recent Results (from the past 240 hour(s))  URINE CULTURE     Status: None   Collection Time    02/22/14  5:33 PM      Result Value Ref Range Status   Specimen Description URINE, CATHETERIZED   Final   Special Requests NONE   Final   Culture  Setup Time     Final   Value: 02/23/2014 01:43      Performed at [facility] Count     Final   Value: >=100,000 COLONIES/ML     Performed at [facility]   Culture     Final   Value: PSEUDOMONAS AERUGINOSA     Performed at [facility]   Report Status 02/25/2014 FINAL   Final   Organism ID, Bacteria PSEUDOMONAS AERUGINOSA   Final  MRSA PCR SCREENING     Status: Abnormal   Collection Time    02/23/14 12:11 AM      Result Value Ref Range Status   MRSA by PCR POSITIVE (*) NEGATIVE Final   Comment:            The GeneXpert MRSA Assay (FDA     approved for NASAL specimens     only), is one component of a     comprehensive MRSA colonization     surveillance program. It is not     intended  to diagnose MRSA     infection nor to guide or     monitor treatment for     MRSA infections.     RESULT CALLED TO, READ BACK BY AND VERIFIED WITH:     SPOKE WITH ASARO,J RN [phone] C[place],N  URINE CULTURE     Status: None   Collection Time    03/01/14  3:00 PM      Result Value Ref Range Status   Specimen Description URINE, CLEAN CATCH   Final   Special Requests NONE   Final   Culture  Setup Time     Final   Value: 03/01/2014 21:20     [facility]   Colony Count     Final   Value: 50,000 COLONIES/ML     [facility]formed [facility]   Culture     Final   Value: Multiple bacterial morphotypes present, none predominant. Suggest appropriate recollection if clinically indicated.     [facility]formed at [facility]   Report Status 03/03/2014 FINAL   Final     Studies: No results found.  Scheduled Meds: . bisacodyl  10 mg Oral Once  . buPROPion  300 mg Oral Daily  . cefTAZidime (FORTAZ)  IV  1 g Intravenous 3 times per day  . docusate sodium  100 mg Oral BID  . fluticasone  1 spray Each Nare Daily  . gabapentin  300 mg Oral BID  . hydrocortisone cream   Topical TID  . insulin aspart  0-9 Units Subcutaneous TID WC  . insulin aspart protamine- aspart  34 Units Subcutaneous  BID WC  . levothyroxine  300 mcg Oral QAC breakfast  . loratadine  10 mg Oral Daily  . lubiprostone  24 mcg Oral BID WC  . methadone  30 mg Oral 3 times per day  . nystatin ointment   Topical BID  . oxybutynin  5 mg Oral TID  . polyvinyl alcohol  1 drop Both Eyes BID  . sodium chloride  3 mL Intravenous Q12H  . sucralfate  1 g Oral TID WC & HS  . tiZANidine  4 mg Oral QPM  . traZODone  100 mg Oral QHS  . venlafaxine XR  75 mg Oral Q breakfast   Continuous Infusions: . sodium chloride 20 mL/hr at 03/02/14 [id number]    Principal Problem:   Urinary tract infection Active Problems:   Unspecified hereditary and idiopathic peripheral neuropathy   Chronic pain syndrome   HTN (hypertension)   Morbid obesity   Type II or unspecified type diabetes mellitus with peripheral circulatory disorders, uncontrolled(250.72)   Depression   Unspecified hypothyroidism   Unspecified constipation   COPD (chronic obstructive pulmonary disease)   Anemia, iron deficiency   Scalp lesion   Enlarged parotid gland   Inadequate material resources   DNR (do not resuscitate)    Time spent: 25 min    [facility] LLCKULA,Suhail Peloquin  [facility] Pager [phone]. If 7PM-7AM, please contact night-coverage at [email or web address], password [email or web address] 03/04/2014, 5:28 PM  LOS: 10 days

## 2014-03-05 NOTE — Progress Notes (Signed)
Emptied one of patients drink pitchers and noticed a cherry scent while pouring down the drain. Possible patient has been pouring Golytely into her drink pitchers. MD was notified of finding.

## 2014-03-05 NOTE — Progress Notes (Signed)
Pt requested an enema to help her have a bowel movement. Fleets enema offered as prescribed; but pt refused and requested a tap water enema instead. NP made aware. Tap water enema ordered. Pt then given enema. Had a large loose and watery BM after . Now refusing to drink rest of bowel prep. Vwilliams,rn.

## 2014-03-05 NOTE — Anesthesia Postprocedure Evaluation (Signed)
  Anesthesia Post-op Note  Patient: Karen BarrioChristy Walls  Procedure(s) Performed: Procedure(s) (LRB): ESOPHAGOGASTRODUODENOSCOPY (EGD) WITH PROPOFOL (N/A) COLONOSCOPY WITH PROPOFOL (N/A)  Patient Location: PACU  Anesthesia Type: MAC  Level of Consciousness: awake and alert   Airway and Oxygen Therapy: Patient Spontanous Breathing  Post-op Pain: mild  Post-op Assessment: Post-op Vital signs reviewed, [name] Cardiovascular Status Stable, Respiratory Function Stable, Patent Airway and No signs of Nausea or vomiting  Last Vitals:  Filed Vitals:   03/05/14 1120  BP: 101/61  Pulse:   Temp:   Resp: 25    Post-op Vital Signs: stable   Complications: No apparent anesthesia complications

## 2014-03-05 NOTE — Op Note (Signed)
[facility] [address] [place], [id number]   COLONOSCOPY PROCEDURE REPORT  PATIENT: Theotis BarrioCraddock, Karen  MR#: [id number] BIRTHDATE: 10-03-1961 , 52  yrs. old GENDER: Female ENDOSCOPIST: Willis ModenaWilliam Avianah Pellman, MD REFERRED XB:[id number]:[facility] PROCEDURE DATE:  03/05/2014 PROCEDURE:   Colonoscopy, incomplete ASA CLASS:   Class III INDICATIONS:anemia, hemoccult-positive stools. MEDICATIONS: MAC sedation, administered by CRNA  DESCRIPTION OF PROCEDURE:   After the risks benefits and alternatives of the procedure were thoroughly explained, informed consent was obtained.  A digital rectal exam revealed no abnormalities of the rectum.   The Pentax Adult Colonscope [id number] endoscope was introduced through the anus and advanced to the splenic flexure. No adverse events experienced.   The quality of the prep was poor.  The instrument was then slowly withdrawn as the colon was fully examined.    Findings:  Digital rectal exam normal.  Fair prep in the rectosigmoid colon and distal descending colon.  Proximal to the distal descending colon, and progressively worsening upstream, there was formed solid stool.  Prep was too poor to permit adequate colonic evaluation.  Procedure was according aborted.   Retroflexion into rectum was normal; specifically, no anorectal lesion was seen (anorectal asymmetry was noted on prior CT scan).      .  The scope was withdrawn and the procedure completed.  ENDOSCOPIC IMPRESSION:     As above.  Poor prep, despite patient's taking two gallons of Golytely and an enema and two days of clear liquids.      Prep quality inadequate to examine colon.  RECOMMENDATIONS:     1.  Will do clear liquids and another gallon of Golytely today. 2.  Will retry colonoscopy tomorrow with propofol. 3.  If her prep tomorrow is still not good, I will just do my best, as I feel there would be little utility to any further attempts. 4.  [facility] to  follow.  eSigned:  Willis ModenaWilliam Gerard Cantara, MD 03/05/2014 11:06 AM   cc:

## 2014-03-05 NOTE — Anesthesia Preprocedure Evaluation (Addendum)
Anesthesia Evaluation  Patient identified by MRN, date of birth, ID band Patient awake    Reviewed: Allergy & Precautions, H&P , NPO status , [name] Chart, lab work & pertinent test results  Airway Mallampati: II TM Distance: >3 FB Neck ROM: Full    Dental no notable dental hx. (+) Edentulous Upper, Edentulous Lower   Pulmonary sleep apnea and Continuous Positive Airway Pressure Ventilation , COPD COPD inhaler, former smoker,  BiPAP occasionally. She denies significant OSA, but does have some sleep disorders. breath sounds clear to auscultation  Pulmonary exam normal       Cardiovascular hypertension, Pt. on medications + Peripheral Vascular Disease Rhythm:Regular Rate:Normal     Neuro/Psych PSYCHIATRIC DISORDERS Depression H/O lyme disease. Weakness lower extremities. Now in wheelchair.  Neuromuscular disease negative psych ROS   GI/Hepatic negative GI ROS, Neg liver ROS,   Endo/Other  diabetes, Type 2, Insulin Dependent, Oral Hypoglycemic AgentsHypothyroidism Morbid obesity  Renal/GU negative Renal ROS  negative genitourinary   Musculoskeletal  (+) Fibromyalgia -  Abdominal (+) + obese,   Peds negative pediatric ROS (+)  Hematology  (+) anemia ,   Anesthesia Other Findings Large sore at left frontal hairline. States she has enlarged lymph nodes in her neck.  Reproductive/Obstetrics negative OB ROS                          Anesthesia Physical Anesthesia Plan  ASA: III  Anesthesia Plan: MAC   Post-op Pain Management:    Induction: Intravenous  Airway Management Planned:   Additional Equipment:   Intra-op Plan:   Post-operative Plan: Extubation in OR  Informed Consent: I have reviewed the patients History and Physical, chart, labs and discussed the procedure including the risks, benefits and alternatives for the proposed anesthesia with the patient or authorized representative who  has indicated his/her understanding and acceptance.   Dental advisory given  Plan Discussed with: CRNA  Anesthesia Plan Comments:        Anesthesia Quick Evaluation

## 2014-03-05 NOTE — Op Note (Signed)
[facility] [address] [place], [id number]   ENDOSCOPY PROCEDURE REPORT  PATIENT: [name], Maranda  MR#: [id number] BIRTHDATE: 15-Dec-1961 , 52  yrs. old GENDER: Female ENDOSCOPIST: Willis ModenaWilliam Dawsyn Ramsaran, MD REFERRED BY:  [facility] PROCEDURE DATE:  03/05/2014 PROCEDURE:  EGD, diagnostic ASA CLASS:     Class III INDICATIONS:  anemia, hemoccult-positive stool. MEDICATIONS: MAC sedation, administered by CRNA TOPICAL ANESTHETIC:  DESCRIPTION OF PROCEDURE: After the risks benefits and alternatives of the procedure were thoroughly explained, informed consent was obtained.  The Pentax Gastroscope [id number] endoscope was introduced through the mouth and advanced to the second portion of the duodenum. Without limitations.  The instrument was slowly withdrawn as the mucosa was fully examined.     Findings:  Normal esophagus.  Mild diffuse gastritis.  Small submucosal nodule about 1cm in diameter along lesser curvature, with normal overlying mucosa.  Otherwise normal endoscopy to the second portion of the duodenum.           The scope was then withdrawn from the patient and the procedure completed.  ENDOSCOPIC IMPRESSION:     As above.  Gastritis and submucosal nodule are of doubtful clinical significance and are not cause of patient's anemia and hemoccult-positive stools.  RECOMMENDATIONS:     1.  Watch for potential complications of procedure. 2.  Proceed with colonoscopy.  eSigned:  Willis ModenaWilliam Lindzey Zent, MD 03/05/2014 11:10 AM   CC:

## 2014-03-05 NOTE — Progress Notes (Signed)
Pt arrived back to unit very upset after failed colonoscopy.  Pt stated she "feels mistreated" due to past events with colon prep.  Pt stated she refuses to drink anymore bowel prep because "it does not work for me".  Pt stated she needs to eat real food and needs a break from the bowel prep.  Pt stated that drinking the bowel prep makes her feel sick and bloated.  Nurse to notify MD of pt's request.  MD notified and is to come talk to the pt.

## 2014-03-05 NOTE — Progress Notes (Signed)
Clinical Social Work  CSW attempted to meet with patient but patient out of room for procedure. CSW will follow up at later time.  Latifa Noble, LCSW [phone] 

## 2014-03-05 NOTE — Progress Notes (Addendum)
Spoke with patient about care concerns.  Pt continues to feel that she is not being heard, and that her request are not being honored.  Explained hospital/medical procedures/policys that must be followed, pt expressed understanding of this.  Pt is willing to follow care plan at this time to have the colonoscopy done tomorrow.  After speaking with the patient for over an hour, she then went back to feeling done wrong, and wants to either eat and take a break from bowel prep for a few days, or have medications to help her relax and then have colace, laxatives and enemas for bowel prep.  Dr. Blake DivineAkula made aware of this change, Dr. Blake DivineAkula to contact GI to make a final decision on care plan.  Primary RN made aware.  Will follow up.

## 2014-03-06 ENCOUNTER — Encounter (HOSPITAL_COMMUNITY): Payer: Medicare Other | Admitting: Anesthesiology

## 2014-03-06 ENCOUNTER — Encounter (HOSPITAL_COMMUNITY): Payer: Self-pay | Admitting: Gastroenterology

## 2014-03-06 ENCOUNTER — Encounter (HOSPITAL_COMMUNITY)
Admission: EM | Disposition: A | Discharge status: Skilled Nursing Facility | Payer: Self-pay | Source: Home / Self Care | Attending: Internal Medicine

## 2014-03-06 ENCOUNTER — Inpatient Hospital Stay (HOSPITAL_COMMUNITY): Payer: Medicare Other | Admitting: Anesthesiology

## 2014-03-06 HISTORY — PX: COLONOSCOPY WITH PROPOFOL: SHX5780

## 2014-03-06 LAB — GLUCOSE, CAPILLARY
GLUCOSE-CAPILLARY: 86 mg/dL (ref 70–99)
GLUCOSE-CAPILLARY: 101 mg/dL — AB (ref 70–99)
Glucose-Capillary: 109 mg/dL — ABNORMAL HIGH (ref 70–99)
Glucose-Capillary: 95 mg/dL (ref 70–99)
Glucose-Capillary: 65 mg/dL — ABNORMAL LOW (ref 70–99)
Glucose-Capillary: 62 mg/dL — ABNORMAL LOW (ref 70–99)

## 2014-03-06 LAB — CBC
HEMATOCRIT: 24.8 % — AB (ref 36.0–46.0)
Hemoglobin: 6.5 g/dL — CL (ref 12.0–15.0)
MCH: 20.4 pg — ABNORMAL LOW (ref 26.0–34.0)
MCHC: 26.2 g/dL — ABNORMAL LOW (ref 30.0–36.0)
MCV: 77.7 fL — ABNORMAL LOW (ref 78.0–100.0)
PLATELETS: 220 10*3/uL (ref 150–400)
RBC: 3.19 MIL/uL — ABNORMAL LOW (ref 3.87–5.11)
RDW: 16.9 % — AB (ref 11.5–15.5)
WBC: 5.7 10*3/uL (ref 4.0–10.5)

## 2014-03-06 LAB — FOLATE: Folate: 16.3 ng/mL

## 2014-03-06 LAB — VITAMIN B12: Vitamin B-12: 644 pg/mL (ref 211–911)

## 2014-03-06 LAB — RETICULOCYTES
RBC.: 3.1 MIL/uL — ABNORMAL LOW (ref 3.87–5.11)
Retic Count, Absolute: 102.3 10*3/uL (ref 19.0–186.0)
Retic Ct Pct: 3.3 % — ABNORMAL HIGH (ref 0.4–3.1)

## 2014-03-06 LAB — FERRITIN: FERRITIN: 6 ng/mL — AB (ref 10–291)

## 2014-03-06 SURGERY — COLONOSCOPY WITH PROPOFOL
Anesthesia: Monitor Anesthesia Care

## 2014-03-06 MED ORDER — PROPOFOL 10 MG/ML IV BOLUS
INTRAVENOUS | Status: AC
  Filled 2014-03-06: qty 20

## 2014-03-06 MED ORDER — KETAMINE HCL 10 MG/ML IJ SOLN
INTRAMUSCULAR | Status: AC
  Filled 2014-03-06: qty 1

## 2014-03-06 MED ORDER — PROPOFOL INFUSION 10 MG/ML OPTIME
INTRAVENOUS | Status: DC | PRN
  Administered 2014-03-06: 100 ug/kg/min via INTRAVENOUS

## 2014-03-06 MED ORDER — KETAMINE HCL 10 MG/ML IJ SOLN
INTRAMUSCULAR | Status: DC | PRN
  Administered 2014-03-06 (×3): 10 mg via INTRAVENOUS
  Administered 2014-03-06: 20 mg via INTRAVENOUS

## 2014-03-06 MED ORDER — PROPOFOL 10 MG/ML IV BOLUS
INTRAVENOUS | Status: DC | PRN
  Administered 2014-03-06 (×2): 20 mg via INTRAVENOUS

## 2014-03-06 SURGICAL SUPPLY — 21 items

## 2014-03-06 NOTE — Interval H&P Note (Signed)
History and Physical Interval Note:  03/06/2014 3:08 PM  Theotis BarrioChristy Gabrielsen  has presented today for surgery, with the diagnosis of .bleed  The various methods of treatment have been discussed with the patient and family. After consideration of risks, benefits and other options for treatment, the patient has consented to  Procedure(s): COLONOSCOPY WITH PROPOFOL (N/A) as a surgical intervention .  The patient's history has been reviewed, patient examined, no change in status, stable for surgery.  I have reviewed the patient's chart and labs.  Questions were answered to the patient's satisfaction.     Bassheva Flury M  Assessment:  1.  Anemia. 2.  Hemoccult-positive stool. 3.  Constipation.  Plan:  1.  Retry colonoscopy (yesterday with poor prep). 2.  Risks (bleeding, infection, bowel perforation that could require surgery, sedation-related changes in cardiopulmonary systems), benefits (identification and possible treatment of source of symptoms, exclusion of certain causes of symptoms), and alternatives (watchful waiting, radiographic imaging studies, empiric medical treatment) of colonoscopy were explained to patient/family in detail and patient wishes to proceed.

## 2014-03-06 NOTE — Progress Notes (Signed)
Clinical Social Work  CSW attempted to meet with patient but patient out of room for procedure. CSW will follow up at later time.  Chessa Barrasso, LCSW [phone] 

## 2014-03-06 NOTE — Progress Notes (Addendum)
[facility] PROGRESS NOTE  [name] WUJ:[id number] DOB: 1961-05-06 DOA: 02/22/2014 PCP: Terald Sleeper, MD   Interim summary:    53 year old female with multiple medical problems who is currently residing at skilled nursing facility was brought to the hospital with fever and generalized weakness. In the ED, patient was found to have abnormal UA as well as positive stool for occult blood. She also found to have hemoglobin of 7.6, she received one dose of Fortaz in the ED. CT scan of the abdomen was done, which showed abundant colonic stool and no other significant abnormality. She was started on IV fortaz later on transitioned to oral ciprofloxacin on 3/4. She refused to take ciprofloxacin and reported that oral antibiotics would not work. It was later on changed to IV fortaz. She currently completed 8 days of IV fortaz for the treatment of the pseudomonas UTI. Repeat Urine cultures from the supra pubic catheter showed only 50,000 colonies with multiple bacterial morphotypes. She remained afebrile and WBC count is normal.   In view of her anemia and heme positive stools, GI consulted initially , Dr Elnoria Howard saw pt and recommended colonoscopy. But she stated that she wanted a few weeks to think about it, that's when Dr Elnoria Howard signed off. Then next day she reported she wanted to get the procedure done . Dr Elnoria Howard was called back and he was kind enough to put her on schedule for 3/5 after bowel brep. She did not drink her bowel prep on the night of 3/4 and colonoscopy was cancelled and Dr Elnoria Howard signed off. Dr Bosie Clos was on call and I have requested him to see if we can put her on schedule for the procedure.  Since she is on a lot of pain medications and narcotics, she might need propofol and she is scheduled for  colonoscopy in [place] , on Monday. But she continued to have solid bm's and Gi was uable to proceed with her endoscopic procedures. Her bowel prep was continued on Monday and she was  scheduled on Tuesday. She went to OR, underwent EGD, which showed gastritis. Colonoscopy was incomplete because of poor prep, despite 2 gallons of golytely and tap water enemas and two days of clear liquid diet. Dr Dulce Sellar recommended clear liquids and another gallon of golytely today with colonoscopy scheduled in am. But patient is upset about the fact that she did not have  Solid meals for 2 days in a row and wanted to eat solid food.    Meanwhile because of her h/o depression,  psychiatry consulted and they made No medication changes. Palliative care consulted for goals of care on 2/28. She is DNR. But she continues to refuse a blood transfusion. Described to her mother in detail on 03/06/2014, mother agrees that patient is competent to make her decisions.   Assessment/Plan:   Anemia  Does not want blood transfusion. Possibly, Secondary to GI bleed, patient has guaiac positive stools.  Earlier patient had refused  for endoscopy and colonoscopy, but now wants to have colonoscopy, called and discussed with Dr Elnoria Howard, who put her on the schedule on 3/5. But she refused to drink the prep and the colonoscopy on 3/5 was cancelled. Dr Elnoria Howard signed off,. Pt became all teary eyed and reported that she doesn't know that she has to drink the prep/golytely. She promised to drink the prep and go ahead with the colonoscopy. Requested Dr schooler who is on call , to see if can be done. Since she is on a lot  of pain medications and narcotics, she might need propofol and she is scheduled for  colonoscopy in [place] , on wednesday as she continues to have solid BM'S despite drnking the golytely. Spoke to the RN on holding pain medications.   I again discussed possibility of packed RBC transfusion, iron supplementation and transfusion,Aranesp she refused all on 03/06/2014. She understands that she could die. Says does not want any heroics except medications directed towards comfort and diagnostic colonoscopy. She wishes to  remain DO NOT RESUSCITATE. Mother updated in detail. Will have Psych comment on competency.     UTI  Patient has history of chronic UTI and has a suprapubic catheter in place.  Completed 8 days of IV fortaz.     Left parotid swelling CT maxillofacial was done which showed mildly prominent lymph nodes over the left parotid gland without associated inflammatory change, no evidence of abscess findings may be seen in nonspecific parotitis, which could be inflammatory versus infectious etiology. At this time no acute issue, can follow up ENT as outpatient.     Depression  Patient is severely depressed, .  She also has lot of anger against her partner, and also is very unhappy with the care provided at the nursing facility. Psychiatry  consulted to adjust the patient's medications for depression. We'll continue patient's home regimen for depression including Effexor, Wellbutrin. She is not suicidal and appears competent for making her decisions. Clearly states that she wants no heroic measures and wants to be comfortable only, agreeable for general medical treatment and diagnostic colonoscopy. Continue outpatient psych followup post discharge.  Will have Psych comment on competency.      Chronic pain syndrome  Patient has history of Lyme disease, fibromyalgia and has been on methadone 10 mg by mouth 3 times a day along with oxycodone when necessary for pain.     Diabetes mellitus  Patient wants to eat regular food, as she feels that she doesn't have much quality of life. Decreased the dose of  the Novolin 70/30 along with sliding scale insulin. CBG (last 3)   Recent Labs  03/05/14 2228 03/06/14 0720 03/06/14 1124  GLUCAP 115* 109* 95       Constipation  Will start her on colace as she requested in addition to the golytely.      Abdominal pain  Patient has history of chronic UTI with suprapubic catheter in place. CT bone pelvis negative for acute pathology. Likely combination  of constipation and chronic UTI. We'll continue with the methadone and when necessary oxycodone.    Hypokalemia Repleted. He check BMP in the morning     Scalp wound  Most likely self-inflicted, as patient complains of skin tags hanging around for head. And she admits to scratching on the wound. We'll continue with the hydroxyzine when necessary for itching. Patient needs to follow up Dermatology as outpatient.    Hypothyroidism; Continue with synthroid. TSH abnormal. But free t4 normal. The TSH in 4-6 weeks      Code Status: DNR Family Communication: Discussed with patient and her parents at bedside in detail Disposition Plan: SNF possibly on wednesday after colonoscopy.   DVT prophylaxis SCD   Consultants:  Palliative care  Gastroenterology.  Dr Dulce Sellar  Psychiatry.   Procedures:  Colonoscopy planned for 3/11  Antibiotics:  Completed the course of IV fortaz    HPI/Subjective:  In bed appears comfortable, denies any headache chest or abdominal pain at this time. She says she wants colonoscopy to find out where  abdominal pain is coming from on an intermittent basis, she does not want any further heroics, says 1 Gen. medical treatment directed towards comfort. Wishes to be DO NOT RESUSCITATE. Does not want transfusions including PRBC or iron, says she would rather die than get transfusions. She does not want anything that will prolong her life. Except gentle medical treatment for comfort.   I again discussed possibility of packed RBC transfusion, iron supplementation and transfusion,Aranesp she refused all on 03/06/2014. She understands that she could die. Says does not want any heroics except medications directed towards comfort and diagnostic colonoscopy. She wishes to remain DO NOT RESUSCITATE. Mother updated in detail. Will have Psych comment on competency.     Objective: Filed Vitals:   03/06/14 1314  BP: 110/50  Pulse:   Temp: 98.2 F (36.8 C)  Resp:  18    Intake/Output Summary (Last 24 hours) at 03/06/14 1335 Last data filed at 03/06/14 1000  Gross per 24 hour  Intake    520 ml  Output   5850 ml  Net  -5330 ml   Filed Weights   02/22/14 1710 03/06/14 1314  Weight: 127.914 kg (282 lb) 127.914 kg (282 lb)    Exam:  Physical Exam: wake and alert.  Eyes: No signs of jaundice, EOMI Nose: Mucous membranes moist.  Throat: Oropharynx nonerythematous, no exudate appreciated.  Neck: supple,No deformities, masses, or tenderness noted. Lungs: Normal respiratory effort. B/L Clear to auscultation, no crackles or wheezes.  Heart: Regular RR. S1 and S2 normal  Abdomen: BS normoactive. Soft, Nondistended, non-tender.  Extremities: No pretibial edema, no erythema Psych. Insight is intact, she is alert awake, not suicidal.  Data Reviewed: Basic Metabolic Panel:  Recent Labs Lab 03/03/14 0548  NA 141  K 3.9  CL 99  CO2 34*  GLUCOSE 199*  BUN 9  CREATININE 0.76  CALCIUM 8.8   Liver Function Tests: No results found for this basename: AST, ALT, ALKPHOS, BILITOT, PROT, ALBUMIN,  in the last 168 hours No results found for this basename: LIPASE, AMYLASE,  in the last 168 hours No results found for this basename: AMMONIA,  in the last 168 hours CBC:  Recent Labs Lab 03/02/14 0518 03/03/14 0548 03/04/14 0454 03/05/14 0440 03/06/14 0542  WBC 4.5 5.8 7.4 4.8 5.7  HGB 6.3* 6.4* 6.4* 6.0* 6.5*  HCT 24.3* 23.4* 23.9* 22.0* 24.8*  MCV 77.6* 76.7* 76.8* 75.9* 77.7*  PLT 247 259 230 206 220   Cardiac Enzymes: No results found for this basename: CKTOTAL, CKMB, CKMBINDEX, TROPONINI,  in the last 168 hours BNP (last 3 results) No results found for this basename: PROBNP,  in the last 8760 hours CBG:  Recent Labs Lab 03/05/14 1707 03/05/14 1838 03/05/14 2228 03/06/14 0720 03/06/14 1124  GLUCAP 82 80 115* 109* 95    Recent Results (from the past 240 hour(s))  URINE CULTURE     Status: None   Collection Time    03/01/14   3:00 PM      Result Value Ref Range Status   Specimen Description URINE, CLEAN CATCH   Final   Special Requests NONE   Final   Culture  Setup Time     Final   Value: 03/01/2014 21:20     Performed at [facility] Count     Final   Value: 50,000 COLONIES/ML     Performed at [facility]   Culture     Final   Value: Multiple bacterial  morphotypes present, none predominant. Suggest appropriate recollection if clinically indicated.     [facility]   Report Status 03/03/2014 FINAL   Final     Studies: No results found.  Scheduled Meds: . buPROPion  300 mg Oral Daily  . docusate sodium  200 mg Oral BID  . fluticasone  1 spray Each Nare Daily  . gabapentin  300 mg Oral BID  . hydrocortisone cream   Topical TID  . insulin aspart  0-9 Units Subcutaneous TID WC  . insulin aspart protamine- aspart  17 Units Subcutaneous BID WC  . levothyroxine  300 mcg Oral QAC breakfast  . loratadine  10 mg Oral Daily  . lubiprostone  24 mcg Oral BID WC  . methadone  30 mg Oral 3 times per day  . nystatin ointment   Topical BID  . oxybutynin  5 mg Oral TID  . polyethylene glycol-electrolytes  4,000 mL Oral Once  . polyvinyl alcohol  1 drop Both Eyes BID  . sodium chloride  3 mL Intravenous Q12H  . sucralfate  1 g Oral TID WC & HS  . tiZANidine  4 mg Oral QPM  . traZODone  100 mg Oral QHS  . venlafaxine XR  75 mg Oral Q breakfast   Continuous Infusions: . sodium chloride         Time spent: 35 min    SINGH,PRASHANT K  [facility] Pager [phone]. If 7PM-7AM, please contact night-coverage at [email or web address], password [email or web address] 03/06/2014, 1:35 PM  LOS: 12 days

## 2014-03-06 NOTE — Anesthesia Postprocedure Evaluation (Signed)
Anesthesia Post Note  Patient: Karen BarrioChristy Walls  Procedure(s) Performed: Procedure(s) (LRB): COLONOSCOPY WITH PROPOFOL (N/A)  Anesthesia type: MAC  Patient location: PACU  Post pain: Pain level controlled  Post assessment: Post-op Vital signs reviewed  Last Vitals: BP 107/62  Pulse 71  Temp(Src) 36.8 C (Oral)  Resp 21  Ht 5\' 5"  (1.651 m)  Wt 282 lb (127.914 kg)  BMI 46.93 kg/m2  SpO2 95%  Post vital signs: Reviewed  Level of consciousness: awake  Complications: No apparent anesthesia complications

## 2014-03-06 NOTE — Progress Notes (Signed)
Pt to have colonoscopy tomorrow, pt has not completed her prep. MD on call notified. New orders received for Golytely, pt  Continues to refuse to drink Golytely prep, stating that she has tried 3 times and it does not work for her. Pt states the only way she will have bowel movements is with an enema, laxative and stool softeners. Also pt informed that her tray and all her food items will have to be removed at midnight. Pt refuses stating that she will not allow me to take it and that she only needs to be without food starting at 4. After educating the pt on the indications and reasons for her NPO status the pt continues to refuse. MD on call notified. New orders received. Also MD said to reinforce importance of NPO status to pt. Will continue to monitor

## 2014-03-06 NOTE — H&P (View-Only) (Signed)
[facility] PROGRESS NOTE  [name] UEA:[id number] DOB: Jun 21, 1961 DOA: 02/22/2014 PCP: Terald Sleeper, MD Interim summary: 53 year old female with multiple medical problems who is currently residing at skilled nursing facility was brought to the hospital with fever and generalized weakness. In the ED, patient was found to have abnormal UA as well as positive stool for occult blood. She also found to have hemoglobin of 7.6, she received one dose of Fortaz in the ED. CT scan of the abdomen was done, which showed abundant colonic stool and no other significant abnormality. She was started on IV fortaz later on transitioned to oral ciprofloxacin on 3/4. She refused to take ciprofloxacin and reported that oral antibiotics would not work. It was later on changed to IV fortaz. She currently completed 8 days of IV fortaz for the treatment of the pseudomonas UTI. Repeat Urine cultures from the supra pubic catheter showed only 50,000 colonies with multiple bacterial morphotypes. She remained afebrile and WBC count is normal.   In view of her anemia and heme positive stools, GI consulted initially , Dr Elnoria Howard saw pt and recommended colonoscopy. But she stated that she wanted a few weeks to think about it, that's when Dr Elnoria Howard signed off. Then next day she reported she wanted to get the procedure done . Dr Elnoria Howard was called back and he was kind enough to put her on schedule for 3/5 after bowel brep. She did not drink her bowel prep on the night of 3/4 and colonoscopy was cancelled and Dr Elnoria Howard signed off. Dr Bosie Clos was on call and I have requested him to see if we can put her on schedule for the procedure.  Since she is on a lot of pain medications and narcotics, she might need propofol and she is scheduled for  [place]oscopy in [place] , on Monday. But she continued to have solid bm's and Gi was uable to proceed with her endoscopic procedures. Her bowel prep was continued on Monday and she was scheduled on  Tuesday. She went to OR, underwent EGD, which showed gastritis. Colonoscopy was incomplete because of poor prep, despite 2 gallons of golytely and tap water enemas and two days of clear liquid diet. Dr Dulce Sellar recommended clear liquids and another gallon of golytely today with colonoscopy scheduled in am. But patient is upset about the fact that she did not have  Solid meals for 2 days in a row and wanted to eat solid food.    Meanwhile because of her h/o depression,  psychiatry consulted and they made No medication changes. Palliative care consulted for goals of care on 2/28. She is DNR. But she continues to refuse a blood transfusion. Described to her mother in detail on 03/06/2014, mother agrees that patient is competent to make her decisions.   Assessment/Plan:   Anemia  Does not want blood transfusion. Possibly, Secondary to GI bleed, patient has guaiac positive stools.  Earlier patient had refused  for endoscopy and colonoscopy, but now wants to have colonoscopy, called and discussed with Dr Elnoria Howard, who put her on the schedule on 3/5. But she refused to drink the prep and the colonoscopy on 3/5 was cancelled. Dr Elnoria Howard signed off,. Pt became all teary eyed and reported that she doesn't know that she has to drink the prep/golytely. She promised to drink the prep and go ahead with the colonoscopy. Requested Dr schooler who is on call , to see if can be done. Since she is on a lot of pain medications and narcotics,  she might need propofol and she is scheduled for  colonoscopy in [place] , on wednesday as she continues to have solid BM'S despite drnking the golytely. Spoke to the RN on holding pain medications.   I again discussed possibility of packed RBC transfusion, iron supplementation and transfusion,Aranesp she refused all on 03/06/2014. She understands that she could die. Says does not want any heroics except medications directed towards comfort and diagnostic colonoscopy. She wishes to remain DO NOT  RESUSCITATE. Mother updated in detail.    UTI  Patient has history of chronic UTI and has a suprapubic catheter in place.  Completed 8 days of IV fortaz.     Left parotid swelling CT maxillofacial was done which showed mildly prominent lymph nodes over the left parotid gland without associated inflammatory change, no evidence of abscess findings may be seen in nonspecific parotitis, which could be inflammatory versus infectious etiology. At this time no acute issue, can follow up ENT as outpatient.     Depression  Patient is severely depressed, .  She also has lot of anger against her partner, and also is very unhappy with the care provided at the nursing facility. Psychiatry  consulted to adjust the patient's medications for depression. We'll continue patient's home regimen for depression including Effexor, Wellbutrin. She is not suicidal and appears competent for making her decisions. Clearly states that she wants no heroic measures and wants to be comfortable only, agreeable for general medical treatment and diagnostic colonoscopy. Continue outpatient psych followup post discharge.     Chronic pain syndrome  Patient has history of Lyme disease, fibromyalgia and has been on methadone 10 mg by mouth 3 times a day along with oxycodone when necessary for pain.     Diabetes mellitus  Patient wants to eat regular food, as she feels that she doesn't have much quality of life. Decreased the dose of  the Novolin 70/30 along with sliding scale insulin. CBG (last 3)   Recent Labs  03/05/14 2228 03/06/14 0720 03/06/14 1124  GLUCAP 115* 109* 95       Constipation  Will start her on colace as she requested in addition to the golytely.      Abdominal pain  Patient has history of chronic UTI with suprapubic catheter in place. CT bone pelvis negative for acute pathology. Likely combination of constipation and chronic UTI. We'll continue with the methadone and when necessary  oxycodone.    Hypokalemia Repleted. He check BMP in the morning     Scalp wound  Most likely self-inflicted, as patient complains of skin tags hanging around for head. And she admits to scratching on the wound. We'll continue with the hydroxyzine when necessary for itching. Patient needs to follow up Dermatology as outpatient.    Hypothyroidism; Continue with synthroid. TSH abnormal. But free t4 normal. The TSH in 4-6 weeks      Code Status: DNR Family Communication: Discussed with patient and her parents at bedside in detail Disposition Plan: SNF possibly on wednesday after colonoscopy.   DVT prophylaxis SCD   Consultants:  Palliative care  Gastroenterology.  Dr Dulce Sellar  Psychiatry.   Procedures:  Colonoscopy planned for 3/11  Antibiotics:  Completed the course of IV fortaz    HPI/Subjective:  In bed appears comfortable, denies any headache chest or abdominal pain at this time. She says she wants colonoscopy to find out where abdominal pain is coming from on an intermittent basis, she does not want any further heroics, says 1 Gen. medical  treatment directed towards comfort. Wishes to be DO NOT RESUSCITATE. Does not want transfusions including PRBC or iron, says she would rather die than get transfusions. She does not want anything that will prolong her life. Except gentle medical treatment for comfort.    Objective: Filed Vitals:   03/06/14 1314  BP: 110/50  Pulse:   Temp: 98.2 F (36.8 C)  Resp: 18    Intake/Output Summary (Last 24 hours) at 03/06/14 1335 Last data filed at 03/06/14 1000  Gross per 24 hour  Intake    520 ml  Output   5850 ml  Net  -5330 ml   Filed Weights   02/22/14 1710 03/06/14 1314  Weight: 127.914 kg (282 lb) 127.914 kg (282 lb)    Exam:  Physical Exam: wake and alert.  Eyes: No signs of jaundice, EOMI Nose: Mucous membranes moist.  Throat: Oropharynx nonerythematous, no exudate appreciated.  Neck: supple,No  deformities, masses, or tenderness noted. Lungs: Normal respiratory effort. B/L Clear to auscultation, no crackles or wheezes.  Heart: Regular RR. S1 and S2 normal  Abdomen: BS normoactive. Soft, Nondistended, non-tender.  Extremities: No pretibial edema, no erythema Psych. Insight is intact, she is alert awake, not suicidal.  Data Reviewed: Basic Metabolic Panel:  Recent Labs Lab 03/03/14 0548  NA 141  K 3.9  CL 99  CO2 34*  GLUCOSE 199*  BUN 9  CREATININE 0.76  CALCIUM 8.8   Liver Function Tests: No results found for this basename: AST, ALT, ALKPHOS, BILITOT, PROT, ALBUMIN,  in the last 168 hours No results found for this basename: LIPASE, AMYLASE,  in the last 168 hours No results found for this basename: AMMONIA,  in the last 168 hours CBC:  Recent Labs Lab 03/02/14 0518 03/03/14 0548 03/04/14 0454 03/05/14 0440 03/06/14 0542  WBC 4.5 5.8 7.4 4.8 5.7  HGB 6.3* 6.4* 6.4* 6.0* 6.5*  HCT 24.3* 23.4* 23.9* 22.0* 24.8*  MCV 77.6* 76.7* 76.8* 75.9* 77.7*  PLT 247 259 230 206 220   Cardiac Enzymes: No results found for this basename: CKTOTAL, CKMB, CKMBINDEX, TROPONINI,  in the last 168 hours BNP (last 3 results) No results found for this basename: PROBNP,  in the last 8760 hours CBG:  Recent Labs Lab 03/05/14 1707 03/05/14 1838 03/05/14 2228 03/06/14 0720 03/06/14 1124  GLUCAP 82 80 115* 109* 95    Recent Results (from the past 240 hour(s))  URINE CULTURE     Status: None   Collection Time    03/01/14  3:00 PM      Result Value Ref Range Status   Specimen Description URINE, CLEAN CATCH   Final   Special Requests NONE   Final   Culture  Setup Time     Final   Value: 03/01/2014 21:20     Performed at [facility]   Colony Count     Final   Value: 50,000 COLONIES/ML     Performed at [facility]   Culture     Final   Value: Multiple bacterial morphotypes present, none predominant. Suggest appropriate recollection if clinically  indicated.     Performed at [facility]   Report Status 03/03/2014 FINAL   Final     Studies: No results found.  Scheduled Meds: . buPROPion  300 mg Oral Daily  . docusate sodium  200 mg Oral BID  . fluticasone  1 spray Each Nare Daily  . gabapentin  300 mg Oral BID  . hydrocortisone cream  Topical TID  . insulin aspart  0-9 Units Subcutaneous TID WC  . insulin aspart protamine- aspart  17 Units Subcutaneous BID WC  . levothyroxine  300 mcg Oral QAC breakfast  . loratadine  10 mg Oral Daily  . lubiprostone  24 mcg Oral BID WC  . methadone  30 mg Oral 3 times per day  . nystatin ointment   Topical BID  . oxybutynin  5 mg Oral TID  . polyethylene glycol-electrolytes  4,000 mL Oral Once  . polyvinyl alcohol  1 drop Both Eyes BID  . sodium chloride  3 mL Intravenous Q12H  . sucralfate  1 g Oral TID WC & HS  . tiZANidine  4 mg Oral QPM  . traZODone  100 mg Oral QHS  . venlafaxine XR  75 mg Oral Q breakfast   Continuous Infusions: . sodium chloride         Time spent: 35 min    [facility]  [facility] Pager [phone]. If 7PM-7AM, please contact night-coverage at [email or web address], password [facility] 03/06/2014, 1:35 PM  LOS: 12 days

## 2014-03-06 NOTE — Op Note (Signed)
[facility] [address] [place], [id number]   COLONOSCOPY PROCEDURE REPORT  PATIENT: [name], [name]  MR#: [id number] BIRTHDATE: 09-15-1961 , 52  yrs. old GENDER: Female ENDOSCOPIST: Willis ModenaWilliam Dam Ashraf, MD REFERRED QQ:[id number]:[facility] PROCEDURE DATE:  03/06/2014 PROCEDURE:   Colonoscopy, incomplete ASA CLASS:   Class IV INDICATIONS:anemia, hemoccult-positive stool. MEDICATIONS: MAC sedation, administered by CRNA  DESCRIPTION OF PROCEDURE:   After the risks benefits and alternatives of the procedure were thoroughly explained, informed consent was obtained.  A digital rectal exam revealed no abnormalities of the rectum.   The Pentax Adult Colonscope [id number] endoscope was introduced through the anus and advanced to the splenic flexure. No adverse events experienced.   The quality of the prep was     The instrument was then slowly withdrawn as the colon was fully examined.     Findings:  Digital rectal exam normal.  Rectosigmoid and descending colon was normal.  At level of splenic flexure, and worsening proximally, there was continuous solid stool, inadequate to permit mucosal evaluation.  Appearance today appears only slightly improved from yesterday.   Forward- and retroflexed views of rectum were normal.       .  The scope was withdrawn and the procedure completed.  ENDOSCOPIC IMPRESSION:     As above.  Normal left colon.  Poor prep beginning at level of splenic flexure, inadequate to permit evaluation.  RECOMMENDATIONS:     1.  Watch for potential complications of procedure. 2.  I have exhausted all means, standard and heroic, to prep the patient's colon.  There is no utility in any further attempts to prep her colon again.  There are significant barriers to successful bowel prep, including likely colonic inertia, narcotic-induced constipation, and incomplete patient participation in the process. 3.  Will order air contrast barium enema to evaluate  tomorrow, but doubt it will get a very good look at the proximal colon either. 4.  [facility] will follow.  eSigned:  Willis ModenaWilliam Fernado Brigante, MD 03/06/2014 3:44 PM   cc:

## 2014-03-06 NOTE — Progress Notes (Signed)
CSW assisting with d/c planning. [facility] will have a SNF bed for this pt on Thursday 3/12 if ready for d/c.  Cori RazorJamie Jersi Mcmaster LCSW [phone]

## 2014-03-06 NOTE — Progress Notes (Signed)
Hypoglycemic Event  CBG: 65  Treatment: 15 GM carbohydrate snack  Symptoms: None  Follow-up CBG 2327 CBG Result 101   Possible Reasons for Event: Unknown  Comments/MD notified:K. Schorr    [name]  Remember to initiate Hypoglycemia Order Set & complete

## 2014-03-06 NOTE — Progress Notes (Signed)
CSW assisting with d/c planning. Met with pt 3/10 to offer support. [facility] expects to have a bed today for pt if ready for d/c. CSW will continue to follow.  Werner Lean LCSW [phone]

## 2014-03-06 NOTE — Transfer of Care (Signed)
Immediate Anesthesia Transfer of Care Note  Patient: [name]  Procedure(s) Performed: Procedure(s) (LRB): COLONOSCOPY WITH PROPOFOL (N/A)  Patient Location: PACU  Anesthesia Type: MAC  Level of Consciousness: sedated, patient cooperative and responds to stimulation  Airway & Oxygen Therapy: Patient Spontanous Breathing and Patient connected to face mask oxgen  Post-op Assessment: Report given to PACU RN and Post -op Vital signs reviewed and stable  Post vital signs: Reviewed and stable  Complications: No apparent anesthesia complications

## 2014-03-07 ENCOUNTER — Encounter (HOSPITAL_COMMUNITY): Payer: Self-pay | Admitting: Gastroenterology

## 2014-03-07 ENCOUNTER — Inpatient Hospital Stay (HOSPITAL_COMMUNITY): Payer: Medicare Other

## 2014-03-07 DIAGNOSIS — F329 Major depressive disorder, single episode, unspecified: Secondary | ICD-10-CM

## 2014-03-07 DIAGNOSIS — F3289 Other specified depressive episodes: Secondary | ICD-10-CM

## 2014-03-07 LAB — GLUCOSE, CAPILLARY
GLUCOSE-CAPILLARY: 109 mg/dL — AB (ref 70–99)
Glucose-Capillary: 186 mg/dL — ABNORMAL HIGH (ref 70–99)
Glucose-Capillary: 122 mg/dL — ABNORMAL HIGH (ref 70–99)
Glucose-Capillary: 114 mg/dL — ABNORMAL HIGH (ref 70–99)
Glucose-Capillary: 202 mg/dL — ABNORMAL HIGH (ref 70–99)

## 2014-03-07 LAB — IRON AND TIBC
Iron: 29 ug/dL — ABNORMAL LOW (ref 42–135)
SATURATION RATIOS: 8 % — AB (ref 20–55)
TIBC: 342 ug/dL (ref 250–470)
UIBC: 313 ug/dL (ref 125–400)

## 2014-03-07 LAB — BASIC METABOLIC PANEL
BUN: 7 mg/dL (ref 6–23)
CALCIUM: 9 mg/dL (ref 8.4–10.5)
CO2: 25 mEq/L (ref 19–32)
Chloride: 94 mEq/L — ABNORMAL LOW (ref 96–112)
Creatinine, Ser: 0.96 mg/dL (ref 0.50–1.10)
GFR calc Af Amer: 77 mL/min — ABNORMAL LOW (ref >90)
GFR, EST NON AFRICAN AMERICAN: 67 mL/min — AB (ref >90)
GLUCOSE: 272 mg/dL — AB (ref 70–99)
POTASSIUM: 4.2 meq/L (ref 3.7–5.3)
Sodium: 138 mEq/L (ref 137–147)

## 2014-03-07 LAB — HEMOGLOBIN AND HEMATOCRIT, BLOOD
HCT: 27.5 % — ABNORMAL LOW (ref 36.0–46.0)
Hemoglobin: 7.4 g/dL — ABNORMAL LOW (ref 12.0–15.0)

## 2014-03-07 MED ORDER — METHYLNALTREXONE BROMIDE 12 MG/0.6ML ~~LOC~~ SOLN
8.0000 mg | SUBCUTANEOUS | Status: DC
  Administered 2014-03-07 – 2014-03-09 (×2): 8 mg via SUBCUTANEOUS
  Filled 2014-03-07 (×3): qty 0.6

## 2014-03-07 MED ORDER — PEG 3350-KCL-NA BICARB-NACL 420 G PO SOLR
4000.0000 mL | Freq: Once | ORAL | Status: AC
  Administered 2014-03-07: 4000 mL via ORAL

## 2014-03-07 MED ORDER — POLYETHYLENE GLYCOL 3350 17 G PO PACK
17.0000 g | PACK | Freq: Two times a day (BID) | ORAL | Status: DC
  Administered 2014-03-07 – 2014-03-10 (×5): 17 g via ORAL
  Filled 2014-03-07 (×10): qty 1

## 2014-03-07 NOTE — Progress Notes (Signed)
[facility] PROGRESS NOTE  [name] ZOX:[id number] DOB: November 22, 1961 DOA: 02/22/2014 PCP: Karen Sleeper, MD   Interim summary:    53 year old female with multiple medical problems who is currently residing at skilled nursing facility was brought to the hospital with fever and generalized weakness. In the ED, patient was found to have abnormal UA as well as positive stool for occult blood. She also found to have hemoglobin of 7.6, she received one dose of Fortaz in the ED. CT scan of the abdomen was done, which showed abundant colonic stool and no other significant abnormality. She was started on IV fortaz later on transitioned to oral ciprofloxacin on 3/4. She refused to take ciprofloxacin and reported that oral antibiotics would not work. It was later on changed to IV fortaz. She currently completed 8 days of IV fortaz for the treatment of the pseudomonas UTI. Repeat Urine cultures from the supra pubic catheter showed only 50,000 colonies with multiple bacterial morphotypes. She remained afebrile and WBC count is normal.   In view of her anemia and heme positive stools, GI consulted initially , Dr [name] saw pt and recommended colonoscopy. But she stated that she wanted a few weeks to think about it, that's when Dr [name] signed off. Then next day she reported she wanted to get the procedure done . Dr [name] was called back and he was kind enough to put her on schedule for 3/5 after bowel brep. She did not drink her bowel prep on the night of 3/4 and colonoscopy was cancelled and Dr [name] signed off. Dr [name] was on call and I have requested him to see if we can put her on schedule for the procedure.  Since she is on a lot of pain medications and narcotics, she might need propofol and she is scheduled for  colonoscopy in [place] , on Monday. But she continued to have solid bm's and Gi was uable to proceed with her endoscopic procedures. Her bowel prep was continued on Monday and she was  scheduled on Tuesday. She went to OR, underwent EGD, which showed gastritis. Colonoscopy was incomplete because of poor prep, despite 2 gallons of golytely and tap water enemas and two days of clear liquid diet. Dr [name] recommended clear liquids and another gallon of golytely today with colonoscopy scheduled in am. But patient is upset about the fact that she did not have  Solid meals for 2 days in a row and wanted to eat solid food.    Meanwhile because of her h/o depression,  psychiatry consulted and they made No medication changes. Palliative care consulted for goals of care on 2/28. She is DNR. But she continues to refuse a blood transfusion. Described to her mother in detail on 03/06/2014, mother agrees that patient is competent to make her decisions, psychiatry will be formally consulted to comment on her capacity to make decisions.   Patient continues to be declining medical interventions on a regular basis including colonoscopy prep which she has refused several times, GI and hospitalist service counselor several times without much benefit. Colonoscopy was unsuccessful due to poor prep.     Assessment/Plan:   Anemia   Does not want blood transfusion. Possibly, Secondary to GI bleed, patient has guaiac positive stools.  Earlier patient had refused  for endoscopy and colonoscopy, but now wants to have colonoscopy, called and discussed with Dr [name], who put her on the schedule on 3/5. But she refused to drink the prep and the colonoscopy on 3/5 was  cancelled. Dr [name] signed off,. Pt became all teary eyed and reported that she doesn't know that she has to drink the prep/golytely. She promised to drink the prep and go ahead with the colonoscopy.    Patient continues to be declining medical interventions on a regular basis including colonoscopy prep which she has refused several times, GI and hospitalist service counselor several times without much benefit. Colonoscopy was unsuccessful due to  poor prep. GI has no ordered a barium enema for evaluation of her GI tract it will be done later today. We'll also order relostor one dose to assist with narcotic induced constipation and stool retention.   I again discussed possibility of packed RBC transfusion, iron supplementation and transfusion,Aranesp she refused all on 03/06/2014. She understands that she could die. Says does not want any heroics except medications directed towards comfort and diagnostic colonoscopy. She wishes to remain DO NOT RESUSCITATE. Mother updated in detail. Will have Psych comment on competency, psych requested to see the patient we await their input.       UTI  Patient has history of chronic UTI and has a suprapubic catheter in place.  Completed 8 days of IV fortaz.      Left parotid swelling CT maxillofacial was done which showed mildly prominent lymph nodes over the left parotid gland without associated inflammatory change, no evidence of abscess findings may be seen in nonspecific parotitis, which could be inflammatory versus infectious etiology. At this time no acute issue, can follow up ENT as outpatient.     Depression  Patient is severely depressed, .  She also has lot of anger against her partner, and also is very unhappy with the care provided at the nursing facility. Psychiatry  consulted to adjust the patient's medications for depression. We'll continue patient's home regimen for depression including Effexor, Wellbutrin. She is not suicidal and appears competent for making her decisions. Clearly states that she wants no heroic measures and wants to be comfortable only, agreeable for general medical treatment and diagnostic colonoscopy. Continue outpatient psych followup post discharge.  Will have Psych comment on competency, psych requested to see the patient we await their input.      Chronic pain syndrome  Patient has history of Lyme disease, fibromyalgia and has been on methadone 10 mg by  mouth 3 times a day along with oxycodone when necessary for pain. She refuses to allow [place] to titrate her pain medications.     Diabetes mellitus  Patient wants to eat regular food, as she feels that she doesn't have much quality of life. Decreased the dose of  the Novolin 70/30 along with sliding scale insulin. CBG (last 3)   Recent Labs  03/06/14 2230 03/06/14 2327 03/07/14 0801  GLUCAP 62* 101* 186*         Constipation  Likely narcotic induced, will repeat abdominal x-ray, no obstruction we'll give 1 dose Relostor along with aggressive bowel regimen. She refuses to cut down on pain medications. Despite counseling.     Abdominal pain  Patient has history of chronic UTI with suprapubic catheter in place. CT bone pelvis negative for acute pathology. Likely combination of constipation and chronic UTI. We'll continue with the methadone and when necessary oxycodone.    Hypokalemia Repleted. He check BMP in the morning     Scalp wound  Most likely self-inflicted, as patient complains of skin tags hanging around for head. And she admits to scratching on the wound. We'll continue with the hydroxyzine when necessary  for itching. Patient needs to follow up Dermatology as outpatient.    Hypothyroidism; Continue with synthroid. TSH abnormal. But free t4 normal. Repeat TSH in 4-6 weeks      Code Status: DNR Family Communication: Discussed with patient and her parents at bedside in detail Disposition Plan: SNF possibly on wednesday after colonoscopy.   DVT prophylaxis SCD   Consultants:  Palliative care  Gastroenterology.  Dr [name]  Psychiatry.   Procedures:  Colonoscopy planned for 3/11  Antibiotics:  Completed the course of IV fortaz    HPI/Subjective:  In bed appears comfortable, denies any headache chest or abdominal pain at this time. She says she wants colonoscopy to find out where abdominal pain is coming from on an intermittent basis, she does  not want any further heroics, says 1 Gen. medical treatment directed towards comfort. Wishes to be DO NOT RESUSCITATE. Does not want transfusions including PRBC or iron, says she would rather die than get transfusions. She does not want anything that will prolong her life. Except gentle medical treatment for comfort.   I again discussed possibility of packed RBC transfusion, iron supplementation and transfusion,Aranesp she refused all on 03/06/2014. She understands that she could die. Says does not want any heroics except medications directed towards comfort and diagnostic colonoscopy. She wishes to remain DO NOT RESUSCITATE. Mother updated in detail. Will have Psych comment on competency.     Objective: Filed Vitals:   03/07/14 0620  BP: 115/58  Pulse: 100  Temp: 97.5 F (36.4 C)  Resp: 14    Intake/Output Summary (Last 24 hours) at 03/07/14 1232 Last data filed at 03/07/14 4098  Gross per 24 hour  Intake    680 ml  Output   1450 ml  Net   -770 ml   Filed Weights   02/22/14 1710 03/06/14 1314  Weight: 127.914 kg (282 lb) 127.914 kg (282 lb)    Exam:  Physical Exam: wake and alert.  Eyes: No signs of jaundice, EOMI Nose: Mucous membranes moist.  Throat: Oropharynx nonerythematous, no exudate appreciated.  Neck: supple,No deformities, masses, or tenderness noted. Lungs: Normal respiratory effort. B/L Clear to auscultation, no crackles or wheezes.  Heart: Regular RR. S1 and S2 normal  Abdomen: BS normoactive. Soft, Nondistended, non-tender.  Extremities: No pretibial edema, no erythema Psych. Insight is intact, she is alert awake, not suicidal.  Data Reviewed: Basic Metabolic Panel:  Recent Labs Lab 03/03/14 0548 03/07/14 0440  NA 141 138  K 3.9 4.2  CL 99 94*  CO2 34* 25  GLUCOSE 199* 272*  BUN 9 7  CREATININE 0.76 0.96  CALCIUM 8.8 9.0   Liver Function Tests: No results found for this basename: AST, ALT, ALKPHOS, BILITOT, PROT, ALBUMIN,  in the last 168  hours No results found for this basename: LIPASE, AMYLASE,  in the last 168 hours No results found for this basename: AMMONIA,  in the last 168 hours CBC:  Recent Labs Lab 03/02/14 0518 03/03/14 0548 03/04/14 0454 03/05/14 0440 03/06/14 0542 03/07/14 0440  WBC 4.5 5.8 7.4 4.8 5.7  --   HGB 6.3* 6.4* 6.4* 6.0* 6.5* 7.4*  HCT 24.3* 23.4* 23.9* 22.0* 24.8* 27.5*  MCV 77.6* 76.7* 76.8* 75.9* 77.7*  --   PLT 247 259 230 206 220  --    Cardiac Enzymes: No results found for this basename: CKTOTAL, CKMB, CKMBINDEX, TROPONINI,  in the last 168 hours BNP (last 3 results) No results found for this basename: PROBNP,  in the last  8760 hours CBG:  Recent Labs Lab 03/06/14 1727 03/06/14 2202 03/06/14 2230 03/06/14 2327 03/07/14 0801  GLUCAP 86 65* 62* 101* 186*    Recent Results (from the past 240 hour(s))  URINE CULTURE     Status: None   Collection Time    03/01/14  3:00 PM      Result Value Ref Range Status   Specimen Description URINE, CLEAN CATCH   Final   Special Requests NONE   Final   Culture  Setup Time     Final   Value: 03/01/2014 21:20     [facility] at [facility]   Colony Count     Final   Value: 50,000 COLONIES/ML     [facility]formed at [facility]   Culture     Final   Value: Multiple bacterial morphotypes present, none predominant. Suggest appropriate recollection if clinically indicated.     Performed at [facility]   Report Status 03/03/2014 FINAL   Final     Studies: No results found.  Scheduled Meds: . buPROPion  300 mg Oral Daily  . docusate sodium  200 mg Oral BID  . fluticasone  1 spray Each Nare Daily  . gabapentin  300 mg Oral BID  . hydrocortisone cream   Topical TID  . insulin aspart  0-9 Units Subcutaneous TID WC  . insulin aspart protamine- aspart  17 Units Subcutaneous BID WC  . levothyroxine  300 mcg Oral QAC breakfast  . loratadine  10 mg Oral Daily  . lubiprostone  24 mcg Oral BID WC  . methadone  30 mg Oral 3  times per day  . nystatin ointment   Topical BID  . oxybutynin  5 mg Oral TID  . polyethylene glycol-electrolytes  4,000 mL Oral Once  . polyvinyl alcohol  1 drop Both Eyes BID  . sodium chloride  3 mL Intravenous Q12H  . sucralfate  1 g Oral TID WC & HS  . tiZANidine  4 mg Oral QPM  . traZODone  100 mg Oral QHS  . venlafaxine XR  75 mg Oral Q breakfast   Continuous Infusions:       Time spent: 35 min    SINGH,PRASHANT K  [facility] Pager [phone]. If 7PM-7AM, please contact night-coverage at [email or web address], password [email or web address] 03/07/2014, 12:32 PM  LOS: 13 days
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
## 2014-03-07 NOTE — Progress Notes (Signed)
Pt c/o pain 5/10, stating it is everywhere but "really bad in my butt." I offered to reposition patient, educating her on the importance of repositioning and she refused.   Pt has 2 IVs in that have been in since 2/27. She has no more medications in her MAR that need to be given IV and she is currently NSL. I explained to her the importance of removing the IVs due to risk of infection and asked her if I could. Patient refused to let me remove the IVs.

## 2014-03-07 NOTE — Progress Notes (Signed)
Getting barium enema today.  Results pending.  Will revisit tomorrow.

## 2014-03-07 NOTE — Progress Notes (Signed)
Pt has 2 PIV sites that are out of date (both inserted on 2272015) refuses to let staff restart either of these sites even after being informed of the increased risk of infection when leaving them in longer than 96 hours. Will continue to monitor.

## 2014-03-07 NOTE — Progress Notes (Signed)
Pt refuses to wear her SCD's even after being explained the risks and benefits of using them in attempts to lower the risk of DVT. Will continue to monitor.

## 2014-03-08 LAB — GLUCOSE, CAPILLARY
GLUCOSE-CAPILLARY: 111 mg/dL — AB (ref 70–99)
GLUCOSE-CAPILLARY: 111 mg/dL — AB (ref 70–99)
GLUCOSE-CAPILLARY: 166 mg/dL — AB (ref 70–99)

## 2014-03-08 MED ORDER — OXYCODONE HCL 10 MG PO TABS: 10.0000 mg | ORAL_TABLET | Freq: Three times a day (TID) | ORAL | Status: DC | PRN

## 2014-03-08 MED ORDER — DSS 100 MG PO CAPS: 200.0000 mg | ORAL_CAPSULE | Freq: Two times a day (BID) | ORAL | Status: AC

## 2014-03-08 MED ORDER — DIAZEPAM 2 MG PO TABS: 2.0000 mg | ORAL_TABLET | Freq: Two times a day (BID) | ORAL | Status: DC | PRN

## 2014-03-08 MED ORDER — METHADONE HCL 10 MG PO TABS: ORAL_TABLET | ORAL | Status: DC

## 2014-03-08 MED ORDER — POLYETHYLENE GLYCOL 3350 17 G PO PACK: 17.0000 g | PACK | Freq: Two times a day (BID) | ORAL | Status: DC

## 2014-03-08 MED ORDER — BISACODYL 5 MG PO TBEC: 10.0000 mg | DELAYED_RELEASE_TABLET | Freq: Every day | ORAL | Status: DC

## 2014-03-08 NOTE — Progress Notes (Signed)
Limited air contrast barium enema showed no obvious colonic pathology.  I have discussed with Dr. Thedore MinsSingh, who tells me that patient has again resisted efforts at bowel prep to relieve her constipation.  There is nothing else I can offer at this point.  If patient has change-of-heart or appears more willing to participate in the work it will take to treat her constipation, then I am happy to see as outpatient, and we could even consider retry colonoscopy in the future as outpatient if her willingness to follow all of our instructions re: bowel prep improve.  Patient can feel free to call our office [facility], [phone]) to set up an office visit.  Will sign-off at this point; please call with questions; thank you for the consult.

## 2014-03-08 NOTE — Progress Notes (Signed)
NUTRITION FOLLOW UP  Intervention:   - Asked pt if she wanted social worker to visit as pt had multiple complaints about where she was staying PTA, however pt stated "No, I don't want to see social worker, I want to be left alone".  - Educated pt on iron rich foods to help with anemia - Nutrition signing off, pt eating well and denies any nutritional concerns   Nutrition Dx:   Inadequate oral intake related to UTI as evidenced by <75% intake of meals-resolved    Goal:   Pt to meet >/= 90% of their estimated nutrition needs - met    Assessment:   3/01: Patient with multiple medical problems, including diabetes, SNF resistent with history of iron deficiency anemia. Admitted with UTI, anemia secondary to GI bleed.  Per chart review, patient with excessive eating at SNF, seen by RD there. However, her intake is fair since admission with 50-75% intake of meals. She does report some nausea and vomiting. She refuses diabetes diets, insisting on eating regular food. Unable to state usual body weight, no additional weights on file in chart.  [name] seen by palliative care.   3/05: -Pt refused to drink Golytely bowel prep for EGD and colonscopy per RN notes. GI signed off services d/t pt's repeated cancellations -PO intake 100%. Pt reported being hungry after being on clear liquid diet for 2 days Was eager to begin solid foods. Diet was advanced to regular on 3/05. Offered assistance with meal ordering, which patient declined -Continues to request liberalized diet d/t medical condition -Declined any other nutrition interventions/educations  3/13: -Had EGD and colonoscopy (incomplete) 3/10 that showed mild diffuse gastritis and solid stool proximal to distal descending colon.  -Had colonoscopy 3/11 that again showed solid stool inadequate to permit mucosal evaluation -Diet advanced to regular today, pt eating 100% of meals    Height: Ht Readings from Last 1 Encounters:  03/06/14 _0   (1.651 m)    Weight Status:   Wt Readings from Last 1 Encounters:  03/06/14 282 lb (127.914 kg)  Admit wt:        282 lb  Re-estimated needs:  Kcal: 2000-2300 kcal  Protein: 105-120 g  Fluid: >2.3 L/day   Skin: Scalp wounds, stage 2 pressure ulcer sacrum, +2 generalized edema   Diet Order: General   Intake/Output Summary (Last 24 hours) at 03/08/14 1425 Last data filed at 03/08/14 0630  Gross per 24 hour  Intake      0 ml  Output   4425 ml  Net  -4425 ml    Last BM: 3/11   Labs:   Recent Labs Lab 03/03/14 0548 03/07/14 0440  NA 141 138  K 3.9 4.2  CL 99 94*  CO2 34* 25  BUN 9 7  CREATININE 0.76 0.96  CALCIUM 8.8 9.0  GLUCOSE 199* 272*    CBG (last 3)   Recent Labs  03/07/14 2235 03/08/14 0820 03/08/14 1148  GLUCAP 202* 111* 111*    Scheduled Meds: . buPROPion  300 mg Oral Daily  . docusate sodium  200 mg Oral BID  . fluticasone  1 spray Each Nare Daily  . gabapentin  300 mg Oral BID  . hydrocortisone cream   Topical TID  . insulin aspart  0-9 Units Subcutaneous TID WC  . insulin aspart protamine- aspart  17 Units Subcutaneous BID WC  . levothyroxine  300 mcg Oral QAC breakfast  . loratadine  10 mg Oral Daily  . lubiprostone  24 mcg Oral BID WC  . methadone  30 mg Oral 3 times per day  . methylnaltrexone  8 mg Subcutaneous QODAY  . nystatin ointment   Topical BID  . oxybutynin  5 mg Oral TID  . polyethylene glycol  17 g Oral BID  . polyvinyl alcohol  1 drop Both Eyes BID  . sodium chloride  3 mL Intravenous Q12H  . sucralfate  1 g Oral TID WC & HS  . tiZANidine  4 mg Oral QPM  . traZODone  100 mg Oral QHS  . venlafaxine XR  75 mg Oral Q breakfast     Mikey College MS, RD, LDN [phone] Pager [phone] After Hours Pager

## 2014-03-08 NOTE — Progress Notes (Signed)
[facility] will have a SNF bed for pt 3/14. Week end CSW will assist with d/c planning.  Cori RazorJamie Raylei Losurdo LCSW

## 2014-03-08 NOTE — Discharge Instructions (Signed)
Follow with Primary MD Terald SleeperOBSON,MICHAEL GAVIN, MD along with a your primary psychiatrist and recommended ENT and dermatology physician in 7 days   Get CBC, CMP, TSH, T4, free T3 checked 7 days by Primary MD and again as instructed by your Primary MD.   Activity: As tolerated with Full fall precautions use walker/cane & assistance as needed   Disposition SNF   Diet: Heart Healthy low carb  Accuchecks 4 times/day, Once in AM empty stomach and then before each meal. Log in all results and show them to your Prim.MD in 3 days. If any glucose reading is under 80 or above 300 call your Prim MD immidiately. Follow Low glucose instructions for glucose under 80 as instructed.    For Heart failure patients - Check your Weight same time everyday, if you gain over 2 pounds, or you develop in leg swelling, experience more shortness of breath or chest pain, call your Primary MD immediately. Follow Cardiac Low Salt Diet and 1.8 lit/day fluid restriction.   On your next visit with her primary care physician please Get Medicines reviewed and adjusted.  Please request your Prim.MD to go over all Hospital Tests and Procedure/Radiological results at the follow up, please get all Hospital records sent to your Prim MD by signing hospital release before you go home.   If you experience worsening of your admission symptoms, develop shortness of breath, life threatening emergency, suicidal or homicidal thoughts you must seek medical attention immediately by calling 911 or calling your MD immediately  if symptoms less severe.  You Must read complete instructions/literature along with all the possible adverse reactions/side effects for all the Medicines you take and that have been prescribed to you. Take any new Medicines after you have completely understood and accpet all the possible adverse reactions/side effects.   Do not drive and provide baby sitting services if your were admitted for syncope or siezures  until you have seen by Primary MD or a Neurologist and advised to do so again.  Do not drive when taking Pain medications.    Do not take more than prescribed Pain, Sleep and Anxiety Medications  Special Instructions: If you have smoked or chewed Tobacco  in the last 2 yrs please stop smoking, stop any regular Alcohol  and or any Recreational drug use.  Wear Seat belts while driving.   Please note  You were cared for by a hospitalist during your hospital stay. If you have any questions about your discharge medications or the care you received while you were in the hospital after you are discharged, you can call the unit and asked to speak with the hospitalist on call if the hospitalist that took care of you is not available. Once you are discharged, your primary care physician will handle any further medical issues. Please note that NO REFILLS for any discharge medications will be authorized once you are discharged, as it is imperative that you return to your primary care physician (or establish a relationship with a primary care physician if you do not have one) for your aftercare needs so that they can reassess your need for medications and monitor your lab values.

## 2014-03-08 NOTE — Progress Notes (Signed)
   CARE MANAGEMENT NOTE 03/08/2014  Patient:  [name],[name]   Account Number:  [id number]  Date Initiated:  02/25/2014  Documentation initiated by:  [name]  Subjective/Objective Assessment:   53 Y/O F ADMITTED W/UTI.     Action/Plan:   FROM  SNF-[facility].   Anticipated DC Date:  02/26/2014   Anticipated DC Plan:  SKILLED NURSING FACILITY      DC Planning Services  CM consult      Choice offered to / List presented to:             Status of service:  In process, will continue to follow Medicare Important Message given?  YES (If response is "NO", the following Medicare IM given date fields will be blank) Date Medicare IM given:  02/26/2014 Date Additional Medicare IM given:  03/04/2014  Discharge Disposition:    Per UR Regulation:  Reviewed for med. necessity/level of care/duration of stay  If discussed at Long Length of Stay Meetings, dates discussed:    Comments:  03/08/14 13:30 CSW requested I speak with pt concerning whether or not she plans to pursue the IM review.  Pt verbally states she understands the IM protects her but she is unsure at this time if she wants to pursue a review.  Pt also verbally states [facility]  but states she will think about the review later this afternoon.  CSW made aware.  No other CM needs were communicated.  [name], BSN, CM  [phone].   02/28/14 [name] MAHABIR RN,BSN NCM [phone] NOTED PATIENT DID NOT DRINK TOTAL AMOUNT OF GOLYTELY([name] COMMUNICATION), SO COLONOSCOPY/EGD CANCELLED.GI SIGNED OFF.2ND MEDICARE GIVEN ON 02/26/14,& 3RD MEDICARE IM GIVEN 02/28/14.MD UPDATED.D/C PLAN SNF.  02/26/14 [name] MAHABIR RN,BSN NCM [phone] FOR COLONOSCOPY ON THURSDAY.D/C PLAN SNF.  NOW PATIENT AGREEING TO COLONOSCOPY-INFORMED MD THAT THIS IS AN OTPT PROCEDURE.GI SIGNED OFF YESTERDAY.PMT SPOKE TO PATIENT SEE THEIR NOTE.PSYCH-OTPT SERVICES.MEDICARE IM GIVEN EXPLAINED PROCESS TO PATIENT.PT VOICED  UNDERSTANDING.PATIENT WANTS NEW SNF.D/C PLAN SNF.  02/25/14 [name] MAHABIR RN,BSN NCM [phone] UTI-IV ABX,AWAITING SENSITIVITIES.PSYCH-MED MANAGEMENT.NOTED REFUSING MEDICAL SERVICES.D/C PLAN SNF.

## 2014-03-08 NOTE — Progress Notes (Signed)
CSW met with pt today to assist with d/c planning. Pt complains of still feeling sick and not ready for d/c. [facility] has an opening for her today. Pt states she doesn't want to lose her bed there but just isn't well enough to leave the hospital. Pt has been d/c. CSW will continue to follow to assist with d/c planning.  Werner Lean LCSW [phone]

## 2014-03-08 NOTE — Progress Notes (Addendum)
Clinical Social Work  Per chart review, psych MD documented that patient has capacity to make own decisions. CSW attempted to meet with patient but patient currently working with other staff. CSW will follow up at later time to provide support. CSW discussed case with unit CSW Nutritional therapist(Jamie Haidinger) who is working on placement needs.  [name], Kentucky[place]CS[place] [phone]   Addendum: 1508 RD exited room and reports that she has completed assessment but patient does not want to talk with CSW right now because she is completing appeal. CSW will try again at later time.

## 2014-03-08 NOTE — Discharge Summary (Addendum)
[name], is a 53 y.o. female  DOB [birth year]  MRN [id number].  Admission date:  02/22/2014  Admitting Physician  Maryruth Bun Rama, MD  Discharge Date:  03/11/2014 , original discharge date 03/08/2014  Primary MD  Terald Sleeper, MD  Recommendations for primary care physician for things to follow:   Continue vigorous bowel regimen and reduce narcotic use gradually. She has severe narcotic bowel and remains noncompliant with medical recommendations.   Admission Diagnosis  Urinary tract infection [599.0] Anemia [285.9] Constipation [564.00]   Discharge Diagnosis  Urinary tract infection [599.0] Anemia [285.9] Constipation [564.00]   Narcotic bowel Persistent and severe noncompliance with medical recommendations and medications  Principal Problem:   Urinary tract infection Active Problems:   Unspecified hereditary and idiopathic peripheral neuropathy   Chronic pain syndrome   HTN (hypertension)   Morbid obesity   Type II or unspecified type diabetes mellitus with peripheral circulatory disorders, uncontrolled(250.72)   Depression   Unspecified hypothyroidism   Unspecified constipation   COPD (chronic obstructive pulmonary disease)   Anemia, iron deficiency   Scalp lesion   Enlarged parotid gland   Inadequate material resources   DNR (do not resuscitate)      Past Medical History  Diagnosis Date  . Diabetes mellitus without complication   . Chronic pain   . Morbid obesity   . OSA (obstructive sleep apnea)   . Generalized weakness   . Fibromyalgia   . Lyme disease   . Anemia   . History of blood transfusion     pt states she has had 5 blood transfusions  . Sleep disorder   . UTI (lower urinary tract infection) 02/22/2014  . Hypertension   . Hypothyroidism   . COPD (chronic obstructive pulmonary  disease)   . Shortness of breath   . GERD (gastroesophageal reflux disease)     Past Surgical History  Procedure Laterality Date  . Explaratory      on stomach,   . Appendectomy    . Foot surgery      due to stepping on broken glass  . Spinal tap    . Supra pubic catheter    . Esophagogastroduodenoscopy (egd) with propofol N/A 03/05/2014    Procedure: ESOPHAGOGASTRODUODENOSCOPY (EGD) WITH PROPOFOL;  Surgeon: Willis Modena, MD;  Location: [facility];  Service: Endoscopy;  Laterality: N/A;  . Colonoscopy with propofol N/A 03/05/2014    Procedure: COLONOSCOPY WITH PROPOFOL;  Surgeon: Willis Modena, MD;  Location: [facility];  Service: Endoscopy;  Laterality: N/A;  . Dental restoration/extraction with x-ray    . Colonoscopy with propofol N/A 03/06/2014    Procedure: COLONOSCOPY WITH PROPOFOL;  Surgeon: Willis Modena, MD;  Location: [facility];  Service: Endoscopy;  Laterality: N/A;     Discharge Condition: Stable, poor long term prognosis due to noncompliance with medications on a persistent basis despite counseling   Follow UP  Follow-up Information   Follow up with Terald Sleeper, MD. Schedule an appointment as soon as possible for a visit in 3 days. (And  her primary psychiatrist or psychiatrist of choice)    Specialty:  Internal Medicine   Contact information:   [address] [place] [address] [phone]       Follow up with Dillard CannonNEWMAN, CHRISTOPHER, MD. Schedule an appointment as soon as possible for a visit in 1 week.   Specialty:  Otolaryngology   Contact information:   [address] [place] [address] [phone]       [facility]. Schedule an appointment as soon as possible for a visit in 1 week.   Specialty:  Dermatology   Contact information:   [address] [place] [address] [phone]      Follow up with Freddy JakschUTLAW,WILLIAM M, MD. Schedule an appointment as soon as possible for a visit in 1  week.   Specialty:  Gastroenterology   Contact information:   [address] [place] [address] [phone]         Discharge Instructions  and  Discharge Medications          Discharge Orders   Future Orders Complete By Expires   Diet - low sodium heart healthy  As directed    Discharge instructions  As directed    Comments:     Follow with Primary MD Terald SleeperOBSON,MICHAEL GAVIN, MD along with a your primary psychiatrist and recommended ENT and dermatology physician in 7 days   Get CBC, CMP, TSH, T4, free T3 checked 7 days by Primary MD and again as instructed by your Primary MD.   Activity: As tolerated with Full fall precautions use walker/cane & assistance as needed   Disposition SNF   Diet: Heart Healthy low carb  Accuchecks 4 times/day, Once in AM empty stomach and then before each meal. Log in all results and show them to your Prim.MD in 3 days. If any glucose reading is under 80 or above 300 call your Prim MD immidiately. Follow Low glucose instructions for glucose under 80 as instructed.    For Heart failure patients - Check your Weight same time everyday, if you gain over 2 pounds, or you develop in leg swelling, experience more shortness of breath or chest pain, call your Primary MD immediately. Follow Cardiac Low Salt Diet and 1.8 lit/day fluid restriction.   On your next visit with her primary care physician please Get Medicines reviewed and adjusted.  Please request your Prim.MD to go over all Hospital Tests and Procedure/Radiological results at the follow up, please get all Hospital records sent to your Prim MD by signing hospital release before you go home.   If you experience worsening of your admission symptoms, develop shortness of breath, life threatening emergency, suicidal or homicidal thoughts you must seek medical attention immediately by calling 911 or calling your MD immediately  if symptoms less severe.  You Must read complete  instructions/literature along with all the possible adverse reactions/side effects for all the Medicines you take and that have been prescribed to you. Take any new Medicines after you have completely understood and accpet all the possible adverse reactions/side effects.   Do not drive and provide baby sitting services if your were admitted for syncope or siezures until you have seen by Primary MD or a Neurologist and advised to do so again.  Do not drive when taking Pain medications.    Do not take more than prescribed Pain, Sleep and Anxiety Medications  Special Instructions: If you have smoked or chewed Tobacco  in the  last 2 yrs please stop smoking, stop any regular Alcohol  and or any Recreational drug use.  Wear Seat belts while driving.   Please note  You were cared for by a hospitalist during your hospital stay. If you have any questions about your discharge medications or the care you received while you were in the hospital after you are discharged, you can call the unit and asked to speak with the hospitalist on call if the hospitalist that took care of you is not available. Once you are discharged, your primary care physician will handle any further medical issues. Please note that NO REFILLS for any discharge medications will be authorized once you are discharged, as it is imperative that you return to your primary care physician (or establish a relationship with a primary care physician if you do not have one) for your aftercare needs so that they can reassess your need for medications and monitor your lab values.   Increase activity slowly  As directed        Medication List    STOP taking these medications       doxycycline 100 MG tablet  Commonly known as:  VIBRA-TABS     FORTAZ 1 G injection  Generic drug:  cefTAZidime     magnesium hydroxide 400 MG/5ML suspension  Commonly known as:  MILK OF MAGNESIA      TAKE these medications       acetaminophen 325 MG tablet    Commonly known as:  TYLENOL  Take 650 mg by mouth every 4 (four) hours as needed for mild pain.     benzocaine 10 % mucosal gel  Commonly known as:  ORAJEL  Use as directed 1 application in the mouth or throat 3 (three) times daily as needed for mouth pain.     bisacodyl 5 MG EC tablet  Commonly known as:  DULCOLAX  Take 2 tablets (10 mg total) by mouth daily.     buPROPion 300 MG 24 hr tablet  Commonly known as:  WELLBUTRIN XL  Take 300 mg by mouth daily.     cloNIDine 0.1 MG tablet  Commonly known as:  CATAPRES  Take 0.1 mg by mouth as needed.     diazepam 10 MG tablet  Commonly known as:  VALIUM  Take 10 mg by mouth at bedtime as needed for anxiety.     diazepam 2 MG tablet  Commonly known as:  VALIUM  Take 1 tablet (2 mg total) by mouth 2 (two) times daily as needed for anxiety (and personal care).     DSS 100 MG Caps  Take 200 mg by mouth 2 (two) times daily.     fluticasone 50 MCG/ACT nasal spray  Commonly known as:  FLONASE  Place 1 spray into both nostrils daily.     FRESHKOTE 2.7-2 % Soln  Generic drug:  Polyvinyl Alcohol-Povidone  Place 1 drop into both eyes 2 (two) times daily.     gabapentin 300 MG capsule  Commonly known as:  NEURONTIN  Take 300 mg by mouth 2 (two) times daily.     guaifenesin 100 MG/5ML syrup  Commonly known as:  ROBITUSSIN  Take 200 mg by mouth every 4 (four) hours as needed for cough.     hydrochlorothiazide 25 MG tablet  Commonly known as:  HYDRODIURIL  Take 25 mg by mouth daily.     hydrOXYzine 25 MG tablet  Commonly known as:  ATARAX/VISTARIL  Take 25 mg by mouth every  8 (eight) hours as needed for itching.     insulin lispro 100 UNIT/ML injection  Commonly known as:  HUMALOG  Inject 0-8 Units into the skin See admin instructions. Sliding Scale - Inject before each meal and at bed time     insulin NPH-regular Human (70-30) 100 UNIT/ML injection  Commonly known as:  NOVOLIN 70/30  Inject 25 Units into the skin 2 (two)  times daily with a meal.     ipratropium-albuterol 0.5-2.5 (3) MG/3ML Soln  Commonly known as:  DUONEB  Take 3 mLs by nebulization every 4 (four) hours as needed (for wheezing).     levothyroxine 300 MCG tablet  Commonly known as:  SYNTHROID, LEVOTHROID  Take 300 mcg by mouth daily before breakfast.     loperamide 1 MG/5ML solution  Commonly known as:  IMODIUM  Take 4 mg by mouth as needed for diarrhea or loose stools.     loratadine 10 MG tablet  Commonly known as:  CLARITIN  Take 10 mg by mouth daily.     lubiprostone 24 MCG capsule  Commonly known as:  AMITIZA  Take 24 mcg by mouth 2 (two) times daily with a meal.     methadone 10 MG tablet  Commonly known as:  DOLOPHINE  Take three tablets by mouth every eight hours for pain     nitroGLYCERIN 0.4 MG SL tablet  Commonly known as:  NITROSTAT  Place 0.4 mg under the tongue every 5 (five) minutes as needed for chest pain.     omeprazole 20 MG capsule  Commonly known as:  PRILOSEC  Take 20 mg by mouth daily.     oxybutynin 5 MG tablet  Commonly known as:  DITROPAN  Take 5 mg by mouth 3 (three) times daily.     Oxycodone HCl 10 MG Tabs  Take 1 tablet (10 mg total) by mouth every 8 (eight) hours as needed (severe pain). Take one tablet by mouth every 4 hours as needed     phenazopyridine 200 MG tablet  Commonly known as:  PYRIDIUM  Take 200 mg by mouth 3 (three) times daily as needed for pain.     phenol-menthol 14.5 MG lozenge  Place 1 lozenge inside cheek as needed for sore throat.     polyethylene glycol packet  Commonly known as:  MIRALAX / GLYCOLAX  Take 17 g by mouth 2 (two) times daily.     potassium chloride 10 MEQ CR capsule  Commonly known as:  MICRO-K  Take 10 mEq by mouth daily.     sodium phosphate enema  Commonly known as:  FLEET  Place 1 enema rectally daily as needed. follow package directions     sucralfate 1 G tablet  Commonly known as:  CARAFATE  Take 1 g by mouth 4 (four) times daily -   with meals and at bedtime.     SYSTANE 0.4-0.3 % Soln  Generic drug:  Polyethyl Glycol-Propyl Glycol  Place 2 drops into both eyes every 6 (six) hours as needed.     tizanidine 2 MG capsule  Commonly known as:  ZANAFLEX  Take 2 mg by mouth daily.     tiZANidine 4 MG capsule  Commonly known as:  ZANAFLEX  Take 4 mg by mouth every evening.     traZODone 100 MG tablet  Commonly known as:  DESYREL  Take 100 mg by mouth at bedtime.     venlafaxine XR 150 MG 24 hr capsule  Commonly known as:  EFFEXOR-XR  Take 150 mg by mouth daily with breakfast.     Vitamin D (Ergocalciferol) 50000 UNITS Caps capsule  Commonly known as:  DRISDOL  Take 50,000 Units by mouth every 14 (fourteen) days.          Diet and Activity recommendation: See Discharge Instructions above   Consults obtained - GI, Psych   Major procedures and Radiology Reports - PLEASE review detailed and final reports for all details, in brief -       Ct Abdomen Pelvis Wo Contrast  02/22/2014   CLINICAL DATA:  rectal bleeding, weakness, fibromyalgia , morbid obesity  EXAM: CT ABDOMEN AND PELVIS WITHOUT CONTRAST  TECHNIQUE: Multidetector CT imaging of the abdomen and pelvis was performed following the standard protocol without intravenous contrast.  COMPARISON:  None.  FINDINGS: Sagittal images of the spine are unremarkable. There are streaky artifacts from patient's large body habitus.  Lung bases are unremarkable.  Unenhanced liver shows no biliary ductal dilatation. Markedly distended gallbladder without evidence of calcified gallstones. No pericholecystic fluid is suggested. Partially fatty replaced pancreas. The spleen and adrenal glands are unremarkable.  Kidneys are symmetrical in size. No nephrolithiasis. No hydronephrosis or hydroureter. No aortic aneurysm.  Abundant colonic stool.  No pericecal inflammation.  Abundant stool noted within distal sigmoid colon and rectum. The rectum measures at least 8 cm in diameter  suspicious for fecal impaction. The uterus has a nodular appearance probable due to fibroids. The largest fibroid best visualized in sagittal image 73 measures about 4.4 cm. Correlation with pelvic ultrasound is recommended.  There is a suprapubic bladder catheter.  There is asymmetric thickening of left in left anal wall. Correlation with clinical exam is recommended to exclude inflammation or neoplastic process. Best seen in axial image 77.  No small bowel obstruction.  No adenopathy.  No ascites or free air.  IMPRESSION: Main impression:  Abundant colonic stool. There is distension of the rectum with stool measures at least 8 cm in diameter suspicious for fecal impaction.  There is asymmetric thickening of left in left anal wall. Correlation with clinical exam is recommended to exclude inflammation or neoplastic process. Best seen in axial image 77.  1. Probable fibroid uterus. 2. Suprapubic bladder catheter. 3. Distended gallbladder without calcified gallstones. 4. No hydronephrosis or hydroureter. 5. No small bowel obstruction.   Electronically Signed   By: Natasha Mead M.D.   On: 02/22/2014 14:41   Dg Chest 2 View  02/22/2014   CLINICAL DATA:  Weakness, body pain  EXAM: CHEST  2 VIEW  COMPARISON:  None.  FINDINGS: Mediastinal contour is normal. The heart size is probably enlarged. The aorta is tortuous. Both lungs are clear. The visualized skeletal structures are unremarkable.  IMPRESSION: No active cardiopulmonary disease.   Electronically Signed   By: Sherian Rein M.D.   On: 02/22/2014 14:32   Ct Maxillofacial W/cm  02/23/2014   CLINICAL DATA:  Left parotid swelling.  Rule out underlying abscess.  EXAM: CT MAXILLOFACIAL WITH CONTRAST  TECHNIQUE: Multidetector CT imaging of the maxillofacial structures was performed with intravenous contrast. Multiplanar CT image reconstructions were also generated. A small metallic BB was placed on the right temple in order to reliably differentiate right from left.   CONTRAST:  OMNIPAQUE IOHEXOL 300 MG/ML  SOLN  COMPARISON:  None.  FINDINGS: Orbits are normal and symmetric. Paranasal sinuses well developed and well aerated without significant opacification or air-fluid levels. Mastoid air cells are clear. External auditory canals are normal and  symmetric. There are several lymph nodes over the left parotid gland with the largest anterior to the external auditory canal measuring 9 mm by short axis. There are a couple right parotid gland lymph nodes over the superficial aspect. There are no significant inflammatory changes present. There is no underlying abscess. Remainder of the suprahyoid neck is unremarkable. Visualized infrahyoid neck is unremarkable. Remainder of the bony and soft tissue structures are within normal  IMPRESSION: Increased number of several mildly prominent lymph nodes over the left parotid gland without significant associated inflammatory change. No evidence of abscess. Findings may be seen in nonspecific parotitis which may be of infectious or autoimmune etiology. Neoplastic disease of the less likely. Recommend followup CT 6-8 weeks.   Electronically Signed   By: Elberta Fortis M.D.   On: 02/23/2014 18:23   Dg Abd Portable 1v  03/07/2014   CLINICAL DATA:  Generalized abdominal pain. Barium enema performed earlier.  EXAM: PORTABLE ABDOMEN - 1 VIEW  COMPARISON:  03/07/2014.  FINDINGS: Residual contrast extends throughout the colon. There is no colonic dilation to suggest obstruction or generalized adynamic ileus. There is no small bowel dilation.  Soft tissues are poorly defined due to bowel gas and contrast.  IMPRESSION: No evidence of obstruction. Residual contrast noted throughout the colon.   Electronically Signed   By: Amie Portland M.D.   On: 03/07/2014 17:50   Dg Colon W/cm Ltd  03/07/2014   CLINICAL DATA:  Hemoccult positive stool.  Abdominal pain.  EXAM: SINGLE COLUMN BARIUM ENEMA  TECHNIQUE: Initial scout AP supine abdominal image  obtained to insure adequate colon cleansing. Barium was introduced into the colon in a retrograde fashion and refluxed from the rectum to the cecum. Spot images of the colon followed by overhead radiographs were obtained.  FLUOROSCOPY TIME:  5 min and 10 seconds  COMPARISON:  CT scan 02/22/2014  FINDINGS: Examination is quite limited due to a large amount of stool in the colon. Polyps and colonic mass lesions could easily be missed. I do not see any obvious mass or obstruction.  IMPRESSION: Very limited examination due to a large amount of persistent stool throughout the colon causing large filling defects. No obvious large mass or obstruction.   Electronically Signed   By: Loralie Champagne M.D.   On: 03/07/2014 16:33    Micro Results      Recent Results (from the past 240 hour(s))  URINE CULTURE     Status: None   Collection Time    03/01/14  3:00 PM      Result Value Ref Range Status   Specimen Description URINE, CLEAN CATCH   Final   Special Requests NONE   Final   Culture  Setup Time     Final   Value: 03/01/2014 21:20     Performed at [facility] Count     Final   Value: 50,000 COLONIES/ML     Performed at [facility]   Culture     Final   Value: Multiple bacterial morphotypes present, none predominant. Suggest appropriate recollection if clinically indicated.     Performed at [facility]   Report Status 03/03/2014 FINAL   Final     History of present illness and  Hospital Course:     Kindly see H&P for history of present illness and admission details, please review complete Labs, Consult reports and Test reports for all details in brief Aily Tzeng, is a 52 y.o. female, patient  with history of  chronic pain and fibromyalgia on high dose narcotics at baseline, chronic iron deficiency anemia, noncompliant with medications and medical recommendations and procedures, diabetes mellitus type 2, morbid obesity, generalized weakness largely bedbound,  living in a nursing home, hypothyroidism, COPD, scalp lesion, and large parotid gland, peripheral neuropathy which is headache free per patient was admitted to the hospital from nursing home for generalized weakness and abdominal discomfort which is chronic in nature.    Patient was found to be depressed, had severe narcotic bowel with stool impaction, chronic iron deficiency anemia along with chronic abdominal pain.     Note patient has history of severe narcotic bowel with diffuse stool impaction throughout, she presented here with acute on chronic abdominal discomfort along with some reported blood in stool, she has history of chronic and deficiency anemia, in the hospital she was seen by GI and placed on several bowel regimens which he remained noncompliant off, she initially refused colonoscopy and bowel prep and then a few days later agreed for the same, finally she refused bowel prep again, this happened about 4 times, she was partially cleaned and a partial colonoscopy was done, followed by barium enema which did not show any acute findings, patient also refused packed RBC transfusion, iron transfusion, Aranesp, oral iron, despite multiple counseling sessions she remained noncompliant with bowel regimen and different medications, she was counseled by me, psychiatry and GI. Her H&H is stable, she is hemodynamic stable, have counseled her extensively on narcotic bowel and to minimize her narcotic use and to be compliant with bowel regimen. She will agree one moment but refused it the very next. She was seen by psychiatry and deemed competent to make her decisions. Her mother was updated also on this problem in detail and her mother agrees that patient has been very noncooperative even with her over the last several months. In that they don't get along well.    She changed her mind almost on an hourly basis. She says she doesn't like living at nursing home and would like to stay here for the next few  weeks if possible. However at this time medically there is nothing to hold her back. She's been refusing every medication and not cooperative with procedures. GI has signed off. We'll discharge her on reduced pain and benzodiazepine dosages with a vigorous bowel regimen hopefully she will comply with this at the nursing home. Outpatient followup with GI.    Chr Suprapubic catheter induced UTI  - completed treatment. She has chronic suprapubic catheter which was changed on 03/10/2014. Check urine only if clinical suspicion of UTI with fever and leukocytosis, with chronic suprapubic catheter she will always be colonized.   Finding of left parotid swelling, currently pain-free, outpatient ENT followup recommended   Depression. Home medications to be continued, outpatient psych followup, not suicidal homicidal. Competent per psych.    Chronic pain syndrome with fibromyalgia, history of Lyme disease in the past. Continue oral home medications I have tried to lower her home dosage upon discharge.   ABGs mellitus type II commence home regimen is slightly reduced dose of basal insulin, CBGs q. a.c. and at bedtime adjust dose as needed.   Scalp lesion. Outpatient dermatology followup.    Mildly low TSH, repeat TSH free T3 and T4 in 1-2 weeks.      Today   Subjective:   Fynn Adel today has no headache,no chest abdominal pain,no new weakness tingling or numbness.  Objective:   Blood pressure 124/76,  pulse 82, temperature 98.7 F (37.1 C), temperature source Oral, resp. rate 18, height 5\' 5"  (1.651 m), weight 127.914 kg (282 lb), SpO2 98.00%.   Intake/Output Summary (Last 24 hours) at 03/11/14 1010 Last data filed at 03/11/14 1000  Gross per 24 hour  Intake    240 ml  Output   2900 ml  Net  -2660 ml    Exam Awake Alert, Oriented *3, No new F.N deficits, bizarre bipolar affect [place].AT,PERRAL Supple Neck,No JVD, No cervical lymphadenopathy appriciated.  Symmetrical Chest  wall movement, Good air movement bilaterally, CTAB RRR,No Gallops,Rubs or new Murmurs, No Parasternal Heave +ve B.Sounds, Abd Soft, Non tender, No organomegaly appriciated, No rebound -guarding or rigidity. No Cyanosis, Clubbing or edema, No new Rash or bruise  Data Review   CBC w Diff: Lab Results  Component Value Date   WBC 5.7 03/06/2014   HGB 7.4* 03/07/2014   HCT 27.5* 03/07/2014   PLT 220 03/06/2014   LYMPHOPCT 17 02/22/2014   MONOPCT 8 02/22/2014   EOSPCT 1 02/22/2014   BASOPCT 0 02/22/2014    CMP: Lab Results  Component Value Date   NA 138 03/07/2014   K 4.2 03/07/2014   CL 94* 03/07/2014   CO2 25 03/07/2014   BUN 7 03/07/2014   CREATININE 0.96 03/07/2014   PROT 6.4 02/24/2014   ALBUMIN 2.7* 02/24/2014   BILITOT 0.2* 02/24/2014   ALKPHOS 97 02/24/2014   AST 20 02/24/2014   ALT 37* 02/24/2014  .   Total Time in preparing paper work, data evaluation and todays exam - 35 minutes  Leroy Sea M.D on 03/11/2014 at 10:10 AM  [facility] Office  [phone]

## 2014-03-08 NOTE — Progress Notes (Signed)
Pt requestingto take Golytley at this time although pt refused to take at the ordered time of 1830 according to the previous RN. Solution mixed at bedside with pt and then pt states that she does not know how she is going to be able to drink the entire amount ordered because it is late and she is tired. Pt proceeds to state that this is the third time that she has had to complete the bowel prep and she is tired of having to go through this. Explained to pt that she could just drink as much as she could tolerate, and two cups of golytley were prepared and placed on the bedside table in front of the pt.

## 2014-03-08 NOTE — Progress Notes (Signed)
Pt did not drink any golytley. Pt asleep with two full cups of golytlely on the bedside table.

## 2014-03-09 LAB — GLUCOSE, CAPILLARY
Glucose-Capillary: 105 mg/dL — ABNORMAL HIGH (ref 70–99)
Glucose-Capillary: 138 mg/dL — ABNORMAL HIGH (ref 70–99)
Glucose-Capillary: 155 mg/dL — ABNORMAL HIGH (ref 70–99)
Glucose-Capillary: 72 mg/dL (ref 70–99)
Glucose-Capillary: 80 mg/dL (ref 70–99)

## 2014-03-09 NOTE — Progress Notes (Signed)
[facility] PROGRESS NOTE  [name] AVW:[id number] DOB: 12/12/1961 DOA: 02/22/2014 PCP: Terald SleeperOBSON,MICHAEL GAVIN, MD   Interim summary:    53 year old female with multiple medical problems who is currently residing at skilled nursing facility was brought to the hospital with fever and generalized weakness. In the ED, patient was found to have abnormal UA as well as positive stool for occult blood. She also found to have hemoglobin of 7.6, she received one dose of Fortaz in the ED. CT scan of the abdomen was done, which showed abundant colonic stool and no other significant abnormality. She was started on IV fortaz later on transitioned to oral ciprofloxacin on 3/4. She refused to take ciprofloxacin and reported that oral antibiotics would not work. It was later on changed to IV fortaz. She currently completed 8 days of IV fortaz for the treatment of the pseudomonas UTI. Repeat Urine cultures from the supra pubic catheter showed only 50,000 colonies with multiple bacterial morphotypes. She remained afebrile and WBC count is normal.   In view of her anemia and heme positive stools, GI consulted initially , Dr Elnoria HowardHung saw pt and recommended colonoscopy. But she stated that she wanted a few weeks to think about it, that's when Dr Elnoria HowardHung signed off. Then next day she reported she wanted to get the procedure done . Dr Elnoria HowardHung was called back and he was kind enough to put her on schedule for 3/5 after bowel brep. She did not drink her bowel prep on the night of 3/4 and colonoscopy was cancelled and Dr Elnoria HowardHung signed off. Dr Bosie ClosSchooler was on call and I have requested him to see if we can put her on schedule for the procedure.  Since she is on a lot of pain medications and narcotics, she might need propofol and she is scheduled for  colonoscopy in [place] , on Monday. But she continued to have solid bm's and Gi was uable to proceed with her endoscopic procedures. Her bowel prep was continued on Monday and she was  scheduled on Tuesday. She went to OR, underwent EGD, which showed gastritis. Colonoscopy was incomplete because of poor prep, despite 2 gallons of golytely and tap water enemas and two days of clear liquid diet. Dr Dulce Sellarutlaw recommended clear liquids and another gallon of golytely today with colonoscopy scheduled in am. But patient is upset about the fact that she did not have  Solid meals for 2 days in a row and wanted to eat solid food.    Meanwhile because of her h/o depression,  psychiatry consulted and they made No medication changes. Palliative care consulted for goals of care on 2/28. She is DNR. She continues to refuse a blood transfusion. Described to her mother in detail on 03/06/2014, mother agrees that patient is competent to make her decisions, psychiatry as deemed her to have the capacity to make her own decisions.   Patient continues to be declining medical interventions on a regular basis including colonoscopy prep which she has refused several times, GI and hospitalist service counselor several times without much benefit. Colonoscopy was unsuccessful due to poor prep.     Assessment/Plan:   Anemia   Does not want blood transfusion. Possibly, Secondary to GI bleed, patient has guaiac positive stools.  Earlier patient had refused  for endoscopy and colonoscopy, but now wants to have colonoscopy, called and discussed with Dr Elnoria HowardHung, who put her on the schedule on 3/5. But she refused to drink the prep and the colonoscopy on 3/5 was cancelled.  Dr Elnoria Howard signed off,. Pt became all teary eyed and reported that she doesn't know that she has to drink the prep/golytely. She promised to drink the prep and go ahead with the colonoscopy.    Patient continues to be declining medical interventions on a regular basis including colonoscopy prep which she has refused several times, GI and hospitalist service counselor several times without much benefit. Colonoscopy was unsuccessful due to poor prep.  Barium enema stable except for large amount of stool, continue aggressive bowel regimen which he continues to not cooperate with, also refuses to reduce her narcotic dosages despite extensive explaining and counseling. She did receive 1 dose of Relestor on 03/07/2014.   I again discussed possibility of packed RBC transfusion, iron supplementation and transfusion,Aranesp she refused all on 03/06/2014. She understands that she could die. Says does not want any heroics except medications directed towards comfort and diagnostic colonoscopy. She wishes to remain DO NOT RESUSCITATE. Mother updated in detail. Psych has declared her to be competent to make her own decisions. She has been warned multiple times that her refusal to cooperate with medical treatment can certainly insure her death or disability, she says that she wants to be comfortable and doesn't mind passing away wants to be DO NOT RESUSCITATE, she claims she had been seen by hospice before and if she declines further she would like to see them again.      UTI  Patient has history of chronic UTI and has a suprapubic catheter in place.  Completed 8 days of IV fortaz.      Left parotid swelling CT maxillofacial was done which showed mildly prominent lymph nodes over the left parotid gland without associated inflammatory change, no evidence of abscess findings may be seen in nonspecific parotitis, which could be inflammatory versus infectious etiology. At this time no acute issue, can follow up ENT as outpatient.     Depression  Patient is severely depressed, .  She also has lot of anger against her partner, and also is very unhappy with the care provided at the nursing facility. Psychiatry  consulted to adjust the patient's medications for depression. We'll continue patient's home regimen for depression including Effexor, Wellbutrin. She is not suicidal and appears competent for making her decisions. Clearly states that she wants no heroic  measures and wants to be comfortable only, agreeable for general medical treatment and diagnostic colonoscopy. Continue outpatient psych followup post discharge.  Psych has declared her to be competent.    Chronic pain syndrome  Patient has history of Lyme disease, fibromyalgia and has been on methadone 10 mg by mouth 3 times a day along with oxycodone when necessary for pain. She refuses to allow [place] to titrate her pain medications.     Diabetes mellitus  Patient wants to eat regular food, as she feels that she doesn't have much quality of life. Decreased the dose of  the Novolin 70/30 along with sliding scale insulin. CBG (last 3)   Recent Labs  03/08/14 1717 03/09/14 0032 03/09/14 0832  GLUCAP 166* 72 138*         Constipation  Likely narcotic induced, will repeat abdominal x-ray, no obstruction we'll give 1 dose Relostor along with aggressive bowel regimen. She refuses to cut down on pain medications. Despite counseling. Noncompliant with bowel regimen in prescribed medications on a persistent basis despite being counseled by 2 different hospitalist physician's, 2 different GI physicians and multiple nursing staff.    Abdominal pain  Patient has history  of chronic UTI with suprapubic catheter in place. CT bone pelvis negative for acute pathology. Likely combination of constipation and chronic UTI. We'll continue with the methadone and when necessary oxycodone.    Hypokalemia Repleted. He check BMP in the morning     Scalp wound  Most likely self-inflicted, as patient complains of skin tags hanging around for head. And she admits to scratching on the wound. We'll continue with the hydroxyzine when necessary for itching. Patient needs to follow up Dermatology as outpatient.    Hypothyroidism; Continue with synthroid. TSH abnormal. But free t4 normal. Repeat TSH in 4-6 weeks      Code Status: DNR Family Communication: Discussed with patient and her parents at  bedside in detail Disposition Plan: SNF possibly on wednesday after colonoscopy.   DVT prophylaxis SCD   Consultants:  Palliative care  Gastroenterology.  Dr Dulce Sellar  Psychiatry.   Procedures:  Colonoscopy  3/11, barium enema  Antibiotics:  Completed the course of IV fortaz    HPI/Subjective:  In bed appears comfortable, denies any headache chest or abdominal pain at this time. She says she wants colonoscopy to find out where abdominal pain is coming from on an intermittent basis, she does not want any further heroics, says 1 Gen. medical treatment directed towards comfort. Wishes to be DO NOT RESUSCITATE. Does not want transfusions including PRBC or iron, says she would rather die than get transfusions. She does not want anything that will prolong her life. Except gentle medical treatment for comfort.   I again discussed possibility of packed RBC transfusion, iron supplementation and transfusion,Aranesp she refused all on 03/06/2014. She understands that she could die. Says does not want any heroics except medications directed towards comfort and diagnostic colonoscopy. She wishes to remain DO NOT RESUSCITATE. Mother updated in detail. Will have Psych comment on competency.     Objective: Filed Vitals:   03/09/14 0655  BP: 109/61  Pulse: 73  Temp: 98.5 F (36.9 C)  Resp: 18    Intake/Output Summary (Last 24 hours) at 03/09/14 0851 Last data filed at 03/09/14 0656  Gross per 24 hour  Intake    720 ml  Output   2200 ml  Net  -1480 ml   Filed Weights   02/22/14 1710 03/06/14 1314  Weight: 127.914 kg (282 lb) 127.914 kg (282 lb)    Exam:  Physical Exam: wake and alert.  Eyes: No signs of jaundice, EOMI Nose: Mucous membranes moist.  Throat: Oropharynx nonerythematous, no exudate appreciated.  Neck: supple,No deformities, masses, or tenderness noted. Lungs: Normal respiratory effort. B/L Clear to auscultation, no crackles or wheezes.  Heart: Regular RR. S1  and S2 normal  Abdomen: BS normoactive. Soft, Nondistended, non-tender.  Extremities: No pretibial edema, no erythema Psych. Insight is intact, she is alert awake, not suicidal.  Data Reviewed: Basic Metabolic Panel:  Recent Labs Lab 03/03/14 0548 03/07/14 0440  NA 141 138  K 3.9 4.2  CL 99 94*  CO2 34* 25  GLUCOSE 199* 272*  BUN 9 7  CREATININE 0.76 0.96  CALCIUM 8.8 9.0   Liver Function Tests: No results found for this basename: AST, ALT, ALKPHOS, BILITOT, PROT, ALBUMIN,  in the last 168 hours No results found for this basename: LIPASE, AMYLASE,  in the last 168 hours No results found for this basename: AMMONIA,  in the last 168 hours CBC:  Recent Labs Lab 03/03/14 0548 03/04/14 0454 03/05/14 0440 03/06/14 0542 03/07/14 0440  WBC 5.8 7.4 4.8 5.7  --  HGB 6.4* 6.4* 6.0* 6.5* 7.4*  HCT 23.4* 23.9* 22.0* 24.8* 27.5*  MCV 76.7* 76.8* 75.9* 77.7*  --   PLT 259 230 206 220  --    Cardiac Enzymes: No results found for this basename: CKTOTAL, CKMB, CKMBINDEX, TROPONINI,  in the last 168 hours BNP (last 3 results) No results found for this basename: PROBNP,  in the last 8760 hours CBG:  Recent Labs Lab 03/08/14 0820 03/08/14 1148 03/08/14 1717 03/09/14 0032 03/09/14 0832  GLUCAP 111* 111* 166* 72 138*    Recent Results (from the past 240 hour(s))  URINE CULTURE     Status: None   Collection Time    03/01/14  3:00 PM      Result Value Ref Range Status   Specimen Description URINE, CLEAN CATCH   Final   Special Requests NONE   Final   Culture  Setup Time     Final   Value: 03/01/2014 21:20     Performed at [facility] Count     Final   Value: 50,000 COLONIES/ML     Performed at [facility]   Culture     Final   Value: Multiple bacterial morphotypes present, none predominant. Suggest appropriate recollection if clinically indicated.     Performed at [facility]   Report Status 03/03/2014 FINAL   Final      Studies: Dg Abd Portable 1v  03/07/2014   CLINICAL DATA:  Generalized abdominal pain. Barium enema performed earlier.  EXAM: PORTABLE ABDOMEN - 1 VIEW  COMPARISON:  03/07/2014.  FINDINGS: Residual contrast extends throughout the colon. There is no colonic dilation to suggest obstruction or generalized adynamic ileus. There is no small bowel dilation.  Soft tissues are poorly defined due to bowel gas and contrast.  IMPRESSION: No evidence of obstruction. Residual contrast noted throughout the colon.   Electronically Signed   By: Amie Portland M.D.   On: 03/07/2014 17:50   Dg Colon W/cm Ltd  03/07/2014   CLINICAL DATA:  Hemoccult positive stool.  Abdominal pain.  EXAM: SINGLE COLUMN BARIUM ENEMA  TECHNIQUE: Initial scout AP supine abdominal image obtained to insure adequate colon cleansing. Barium was introduced into the colon in a retrograde fashion and refluxed from the rectum to the cecum. Spot images of the colon followed by overhead radiographs were obtained.  FLUOROSCOPY TIME:  5 min and 10 seconds  COMPARISON:  CT scan 02/22/2014  FINDINGS: Examination is quite limited due to a large amount of stool in the colon. Polyps and colonic mass lesions could easily be missed. I do not see any obvious mass or obstruction.  IMPRESSION: Very limited examination due to a large amount of persistent stool throughout the colon causing large filling defects. No obvious large mass or obstruction.   Electronically Signed   By: Loralie Champagne M.D.   On: 03/07/2014 16:33    Scheduled Meds: . buPROPion  300 mg Oral Daily  . docusate sodium  200 mg Oral BID  . fluticasone  1 spray Each Nare Daily  . gabapentin  300 mg Oral BID  . hydrocortisone cream   Topical TID  . insulin aspart  0-9 Units Subcutaneous TID WC  . insulin aspart protamine- aspart  17 Units Subcutaneous BID WC  . levothyroxine  300 mcg Oral QAC breakfast  . loratadine  10 mg Oral Daily  . lubiprostone  24 mcg Oral BID WC  . methadone  30 mg  Oral 3  times per day  . methylnaltrexone  8 mg Subcutaneous QODAY  . nystatin ointment   Topical BID  . oxybutynin  5 mg Oral TID  . polyethylene glycol  17 g Oral BID  . polyvinyl alcohol  1 drop Both Eyes BID  . sodium chloride  3 mL Intravenous Q12H  . sucralfate  1 g Oral TID WC & HS  . tiZANidine  4 mg Oral QPM  . traZODone  100 mg Oral QHS  . venlafaxine XR  75 mg Oral Q breakfast   Continuous Infusions:       Time spent: 35 min    SINGH,PRASHANT K  [facility] Pager [phone]. If 7PM-7AM, please contact night-coverage at [email or web address], password [facility] 03/09/2014, 8:51 AM  LOS: 15 days

## 2014-03-09 NOTE — Progress Notes (Addendum)
CARE MANAGEMENT NOTE 03/09/2014  Patient:  [name],[name]   Account Number:  [id number]  Date Initiated:  02/25/2014  Documentation initiated by:  [facility]  Subjective/Objective Assessment:   53 Y/O F ADMITTED W/UTI.     Action/Plan:   FROM  SNF-[facility].   Anticipated DC Date:  02/26/2014   Anticipated DC Plan:  SKILLED NURSING FACILITY      DC Planning Services  CM consult      Choice offered to / List presented to:             Status of service:  Completed, signed off Medicare Important Message given?  YES (If response is "NO", the following Medicare IM given date fields will be blank) Date Medicare IM given:  02/26/2014 Date Additional Medicare IM given:  03/04/2014  Discharge Disposition:  SKILLED NURSING FACILITY  Per UR Regulation:  Reviewed for med. necessity/level of care/duration of stay  If discussed at Long Length of Stay Meetings, dates discussed:    Comments:  03/09/2014 5:01 PM  Received Fax from [place]. Faxed required documentation to [facility] including IM and DND. [name] RN CCM Case Mgmt phone [phone]  03/09/2014 1620 NCM review/read to pt Detailed Notice of Discharge. Provided pt with a copy of notice and she read over information. She began writing notes on her copy and states she will not sign at this time. Explained NCM will fax documentation to [facility]. [name] RN CCM Case Mgmt phone [phone]   03/09/2014 1230 NCM received call back from [place], # [phone]  and spoke to [place]. States she checked the system and no request to file an appeal was in system. Pt last request for hospital appeal was 2011. Spoke to [employer] RN, Contractor for Nursing and Tarri Fuller and [employer] Records have been contacted to follow up on any paperwork received from [place]. Medical records have not received any paperwork from [facility] regarding appeal. NCM contacted CSW to arrange PTAR transport to SNF.  Notified pt of plan  for dc back to SNF this afternoon after lunch. [name] RN CCM Case Mgmt phone [phone]  03/09/2014 1018 NCM spoke to pt in regards to dc back to SNF on today. Informed pt dc was for 03/08/2014. States she did receive the IM from NCM on yesterday. She has a phone to call if she had any questions about her dc. NCM contacted [facility] # [phone] and left message for a call back. [name] RN CCM Case Mgmt phone [phone]    03/08/14 13:30 CSW requested I speak with pt concerning whether or not she plans to pursue the IM review.  Pt verbally states she understands the IM protects her but she is unsure at this time if she wants to pursue a review.  Pt also verbally states she understands she has a bed, today, at [facility]  but states she will think about the review later this afternoon.  CSW made aware.  No other Cm needs were communicated.  Freddy Jaksch, BSN, CM  [phone].   02/28/14 [name] MAHABIR RN,BSN NCM [phone] NOTED PATIENT DID NOT DRINK TOTAL AMOUNT OF GOLYTELY([name] COMMUNICATION), SO COLONOSCOPY/EGD CANCELLED.GI SIGNED OFF.2ND MEDICARE GIVEN ON 02/26/14,& 3RD MEDICARE IM GIVEN 02/28/14.MD UPDATED.D/C PLAN SNF.  02/26/14 [name] MAHABIR RN,BSN NCM [phone] FOR COLONOSCOPY ON THURSDAY.D/C PLAN SNF.  NOW PATIENT AGREEING TO COLONOSCOPY-INFORMED MD THAT THIS IS AN OTPT PROCEDURE.GI SIGNED OFF YESTERDAY.PMT SPOKE TO PATIENT SEE THEIR NOTE.PSYCH-OTPT SERVICES.MEDICARE IM GIVEN EXPLAINED PROCESS TO PATIENT.PT VOICED UNDERSTANDING.PATIENT  WANTS NEW SNF.D/C PLAN SNF.  02/25/14 [name] MAHABIR RN,BSN NCM [phone] UTI-IV ABX,AWAITING SENSITIVITIES.PSYCH-MED MANAGEMENT.NOTED REFUSING MEDICAL SERVICES.D/C PLAN SNF.

## 2014-03-09 NOTE — Progress Notes (Signed)
   CARE MANAGEMENT NOTE 03/09/2014  Patient:  [name],[name]   Account Number:  [id number]  Date Initiated:  02/25/2014  Documentation initiated by:  [facility]aren  Subjective/Objective Assessment:   53 Y/O F ADMITTED W/UTI.     Action/Plan:   FROM  [facility]NF-[facility].   Anticipated DC Date:  02/26/2014   Anticipated DC Plan:  SKILLED NURSING FACILITY      DC Planning Services  CM consult      Choice offered to / List presented to:             Status of service:  Completed, signed off Medicare Important Message given?  YES (If response is "NO", the following Medicare IM given date fields will be blank) Date Medicare IM given:  02/26/2014 Date Additional Medicare IM given:  03/04/2014  Discharge Disposition:  SKILLED NURSING FACILITY  Per UR Regulation:  Reviewed for med. necessity/level of care/duration of stay  If discussed at Long Length of Stay Meetings, dates discussed:    Comments:  03/09/2014 1230 NCM received call back from [name], # [phone]  and spoke to [name]. States she checked the system and no request to file an appeal was in system. Pt last request for hospital appeal was 2011. Spoke to Karen TechnologyShearer Bridges RN, ContractorAdministrative Coodinator for Nursing and [name] and [name] have been contacted to follow up on any paperwork received from [facility]. Medical Walls have not received any paperwork from [facility] regarding appeal. NCM contacted CSW to arrange PTAR transport to SNF.  Notified pt of plan for dc back to SNF this afternoon after lunch. Isidoro DonningAlesia Ilze Roselli RN CCM Case Mgmt phone [phone]  03/09/2014 1018 NCM spoke to pt in regards to dc back to SNF on today. Informed pt dc was for 03/08/2014. States she did receive the IM from NCM on yesterday. She has a phone to call if she had any questions about her dc. NCM contacted [facility] # [phone] and left message for a call back. Isidoro DonningAlesia Nikaya Nasby RN CCM Case Mgmt phone [phone]    03/08/14 13:30 CSW requested  I speak with pt concerning whether or not she plans to pursue the IM review.  Pt verbally states she understands the IM protects her but she is unsure at this time if she wants to pursue a review.  Pt also verbally states she understands she has a bed, today, at [facility]  but states she will think about the review later this afternoon.  CSW made aware.  No other Cm needs were communicated.  [name], BSN, CM  [phone].   02/28/14 Karen MAHABIR RN,BSN NCM [phone] NOTED PATIENT DID NOT DRINK TOTAL AMOUNT OF GOLYTELY([name] COMMUNICATION), SO COLONOSCOPY/EGD CANCELLED.GI SIGNED OFF.2ND MEDICARE GIVEN ON 02/26/14,& 3RD MEDICARE IM GIVEN 02/28/14.MD UPDATED.D/C PLAN SNF.  02/26/14 Karen MAHABIR RN,BSN NCM [phone] FOR COLONOSCOPY ON THURSDAY.D/C PLAN SNF.  NOW PATIENT AGREEING TO COLONOSCOPY-INFORMED MD THAT THIS IS AN OTPT PROCEDURE.GI SIGNED OFF YESTERDAY.PMT SPOKE TO PATIENT SEE THEIR NOTE.PSYCH-OTPT SERVICES.MEDICARE IM GIVEN EXPLAINED PROCESS TO PATIENT.PT VOICED UNDERSTANDING.PATIENT WANTS NEW SNF.D/C PLAN SNF.  02/25/14 Karen MAHABIR RN,BSN NCM [phone] UTI-IV ABX,AWAITING SENSITIVITIES.PSYCH-MED MANAGEMENT.NOTED REFUSING MEDICAL SERVICES.D/C PLAN SNF.

## 2014-03-09 NOTE — Progress Notes (Signed)
Per MD, Pt ready for d/c.  Notified RN and facility.  CSW chose not to meet with Pt, as Pt very upset about her d/c and is currently on the phone, upset.  RNCM has been working diligently with Pt, thus Pt is aware that she will be d/c'ing today.  Confirmed receipt of d/c summary by Vond[place], as admission was arrangSeward Methed by Weekday CSW.  Facility ready to receive Pt.  Arranged for transportation.  Providence CrosbyAmanda Deanthony Maull, LCSWA Clinical Social Work [phone]

## 2014-03-09 NOTE — Progress Notes (Signed)
Numerous attempts made with multidisciplinary team persons , to assure patient that her needs for acute hospitalization have been fulfilled and that she is ready for discharge.  Patient is boisterous; she is adamant that she is not leaving. [facility]. Patient insists that she is appealing her discharge with Medicare. Administrative Coordinator, Pamala DuffelShearer Bridges, spoke with the patient in an unsuccessful attempt to convince her to move to her new facility today.  Dr. Thedore MinsSingh notified of patient's refusal to leave, and of need to cancel discharge order.

## 2014-03-10 LAB — GLUCOSE, CAPILLARY
Glucose-Capillary: 143 mg/dL — ABNORMAL HIGH (ref 70–99)
Glucose-Capillary: 73 mg/dL (ref 70–99)

## 2014-03-10 MED ORDER — INSULIN ASPART PROT & ASPART (70-30 MIX) 100 UNIT/ML ~~LOC~~ SUSP
14.0000 [IU] | Freq: Two times a day (BID) | SUBCUTANEOUS | Status: DC
  Administered 2014-03-11: 14 [IU] via SUBCUTANEOUS
  Filled 2014-03-10: qty 10

## 2014-03-10 NOTE — Progress Notes (Signed)
Received call from Isidoro DonningAlesia Shavis, RN-Case Manager who reported pt's appeal was denied. Cathlean Cowerlesia reported that she would be here first thing in the morning to see patient regarding decision.

## 2014-03-10 NOTE — Progress Notes (Signed)
03/10/2014 1034 Received call back from [employer]. Rep states pt did not call for appeal until 03/09/2014. They did receive documentation and will give call back this afternoon with determination. Isidoro DonningAlesia Mirella Gueye RN CCM Case Mgmt phone [phone]  03/10/2014 1000 Contacted [employer] for follow up. Left message for return call. Checked website and no determination was made. Isidoro DonningAlesia Ellawyn Wogan RN CCM Case Mgmt phone [phone]

## 2014-03-10 NOTE — Progress Notes (Signed)
[facility] PROGRESS NOTE  [name] OZH:[id number] DOB: 1961-01-07 DOA: 02/22/2014 PCP: Terald Sleeper, MD   Interim summary:    53 year old female with multiple medical problems who is currently residing at skilled nursing facility was brought to the hospital with fever and generalized weakness. In the ED, patient was found to have abnormal UA as well as positive stool for occult blood. She also found to have hemoglobin of 7.6, she received one dose of Fortaz in the ED. CT scan of the abdomen was done, which showed abundant colonic stool and no other significant abnormality. She was started on IV fortaz later on transitioned to oral ciprofloxacin on 3/4. She refused to take ciprofloxacin and reported that oral antibiotics would not work. It was later on changed to IV fortaz. She currently completed 8 days of IV fortaz for the treatment of the pseudomonas UTI. Repeat Urine cultures from the supra pubic catheter showed only 50,000 colonies with multiple bacterial morphotypes. She remained afebrile and WBC count is normal.   In view of her anemia and heme positive stools, GI consulted initially , Dr Elnoria Howard saw pt and recommended colonoscopy. But she stated that she wanted a few weeks to think about it, that's when Dr Elnoria Howard signed off. Then next day she reported she wanted to get the procedure done . Dr Elnoria Howard was called back and he was kind enough to put her on schedule for 3/5 after bowel brep. She did not drink her bowel prep on the night of 3/4 and colonoscopy was cancelled and Dr Elnoria Howard signed off. Dr Bosie Clos was on call and I have requested him to see if we can put her on schedule for the procedure.  Since she is on a lot of pain medications and narcotics, she might need propofol and she is scheduled for  colonoscopy in [place] , on Monday. But she continued to have solid bm's and Gi was uable to proceed with her endoscopic procedures. Her bowel prep was continued on Monday and she was  scheduled on Tuesday. She went to OR, underwent EGD, which showed gastritis. Colonoscopy was incomplete because of poor prep, despite 2 gallons of golytely and tap water enemas and two days of clear liquid diet. Dr Dulce Sellar recommended clear liquids and another gallon of golytely today with colonoscopy scheduled in am. But patient is upset about the fact that she did not have  Solid meals for 2 days in a row and wanted to eat solid food.    Meanwhile because of her h/o depression,  psychiatry consulted and they made No medication changes. Palliative care consulted for goals of care on 2/28. She is DNR. She continues to refuse a blood transfusion. Described to her mother in detail on 03/06/2014, mother agrees that patient is competent to make her decisions, psychiatry as deemed her to have the capacity to make her own decisions.   Patient continues to be declining medical interventions on a regular basis including colonoscopy prep which she has refused several times, GI and hospitalist service counselor several times without much benefit. Colonoscopy was unsuccessful due to poor prep. Continues to be changing her mind about medical treatment-reuses one moment then agrees later, has done it persistently over 3-4 times with at least 4 different MDs.     She was discharged as medically stable with nothing else to offer in the inpatient setting, patient refuses to leave, CM to arrange DC.    Assessment/Plan:    Anemia   Does not want blood transfusion.  Possibly, Secondary to GI bleed, patient has guaiac positive stools.  Earlier patient had refused  for endoscopy and colonoscopy, but now wants to have colonoscopy, called and discussed with Dr Elnoria HowardHung, who put her on the schedule on 3/5. But she refused to drink the prep and the colonoscopy on 3/5 was cancelled. Dr Elnoria HowardHung signed off,. Pt became all teary eyed and reported that she doesn't know that she has to drink the prep/golytely. She promised to drink the  prep and go ahead with the colonoscopy.    Patient continues to be declining medical interventions on a regular basis including colonoscopy prep which she has refused several times, GI and hospitalist service counselor several times without much benefit. Colonoscopy was unsuccessful due to poor prep. Barium enema stable except for large amount of stool, continue aggressive bowel regimen which he continues to not cooperate with, also refuses to reduce her narcotic dosages despite extensive explaining and counseling. She has been placed on Relestor on 03/07/2014.   I again discussed possibility of packed RBC transfusion, iron supplementation and transfusion,Aranesp she refused all on 03/06/2014. She understands that she could die. Says does not want any heroics except medications directed towards comfort and diagnostic colonoscopy. She wishes to remain DO NOT RESUSCITATE. Mother updated in detail. Psych has declared her to be competent to make her own decisions. She has been warned multiple times that her refusal to cooperate with medical treatment can certainly insure her death or disability, she says that she wants to be comfortable and doesn't mind passing away wants to be DO NOT RESUSCITATE, she claims she had been seen by hospice before and if she declines further she would like to see them again.      UTI  Patient has history of chronic UTI and has a suprapubic catheter in place.  Completed 8 days of IV fortaz.   Chr SP Catheter - change due will change today.     Left parotid swelling CT maxillofacial was done which showed mildly prominent lymph nodes over the left parotid gland without associated inflammatory change, no evidence of abscess findings may be seen in nonspecific parotitis, which could be inflammatory versus infectious etiology. At this time no acute issue, can follow up ENT as outpatient.     Depression  Patient is severely depressed, .  She also has lot of anger against  her partner, and also is very unhappy with the care provided at the nursing facility. Psychiatry  consulted to adjust the patient's medications for depression. We'll continue patient's home regimen for depression including Effexor, Wellbutrin. She is not suicidal and appears competent for making her decisions. Clearly states that she wants no heroic measures and wants to be comfortable only, agreeable for general medical treatment and diagnostic colonoscopy. Continue outpatient psych followup post discharge.  Psych has declared her to be competent.    Chronic pain syndrome  Patient has history of Lyme disease, fibromyalgia and has been on methadone 10 mg by mouth 3 times a day along with oxycodone when necessary for pain. She refuses to allow us to titrate her pain medications.     Diabetes mellitus   Patient wants to eat regular food, as she feels that she doesn't have much quality of life. Decreased the dose of  the Novolin 70/30 along with sliding scale insulin.  CBG (last 3)   Recent Labs  03/09/14 1207 03/09/14 1714 03/09/14 2202  GLUCAP 105* 80 155*         Constipation  Likely narcotic induced, will repeat abdominal x-ray, no obstruction we'll give 1 dose Relostor along with aggressive bowel regimen. She refuses to cut down on pain medications. Despite counseling. Noncompliant with bowel regimen in prescribed medications on a persistent basis despite being counseled by 2 different hospitalist physician's, 2 different GI physicians and multiple nursing staff. Some better on relastor proving this is narcotic Bowel.    Abdominal pain  Patient has history of chronic UTI with suprapubic catheter in place. CT bone pelvis negative for acute pathology. Likely combination of constipation and chronic UTI. We'll continue with the methadone and when necessary oxycodone.    Hypokalemia Repleted. He check BMP in the morning     Scalp wound  Most likely self-inflicted, as patient  complains of skin tags hanging around for head. And she admits to scratching on the wound. We'll continue with the hydroxyzine when necessary for itching. Patient needs to follow up Dermatology as outpatient.    Hypothyroidism; Continue with synthroid. TSH abnormal. But free t4 normal. Repeat TSH in 4-6 weeks      Code Status:   Family Communication: Discussed with patient and her  Who says her daughter is extremely diffcult, they do not get along and she does not listen to her advice.   Disposition Plan: SNF possibly on wednesday after colonoscopy.   DVT prophylaxis SCD   Consultants:  Palliative care  Gastroenterology.  Dr Dulce Sellar  Psychiatry.   Procedures:  Colonoscopy  3/11, barium enema  Antibiotics:  Completed the course of IV fortaz    HPI/Subjective:  In bed appears comfortable, denies any headache chest or abdominal pain at this time. She says she wants colonoscopy to find out where abdominal pain is coming from on an intermittent basis, she does not want any further heroics, says 1 Gen. medical treatment directed towards comfort. Wishes to be DO NOT RESUSCITATE. Does not want transfusions including PRBC or iron, says she would rather die than get transfusions. She does not want anything that will prolong her life. Except gentle medical treatment for comfort.   I again discussed possibility of packed RBC transfusion, iron supplementation and transfusion,Aranesp she refused all on 03/06/2014. She understands that she could die. Says does not want any heroics except medications directed towards comfort and diagnostic colonoscopy. She wishes to remain DO NOT RESUSCITATE. Mother updated in detail. Competent per Psych.   Objective: Filed Vitals:   03/10/14 0617  BP: 111/63  Pulse: 71  Temp: 98.6 F (37 C)  Resp: 14    Intake/Output Summary (Last 24 hours) at 03/10/14 0809 Last data filed at 03/10/14 1610  Gross per 24 hour  Intake    420 ml  Output    3700 ml  Net  -3280 ml   Filed Weights   02/22/14 1710 03/06/14 1314  Weight: 127.914 kg (282 lb) 127.914 kg (282 lb)    Exam:  Physical Exam: wake and alert.  Eyes: No signs of jaundice, EOMI Nose: Mucous membranes moist.  Throat: Oropharynx nonerythematous, no exudate appreciated.  Neck: supple,No deformities, masses, or tenderness noted. Lungs: Normal respiratory effort. B/L Clear to auscultation, no crackles or wheezes.  Heart: Regular RR. S1 and S2 normal  Abdomen: BS normoactive. Soft, Nondistended, non-tender.  Extremities: No pretibial edema, no erythema Psych. Insight is intact, she is alert awake, not suicidal.  Data Reviewed: Basic Metabolic Panel:  Recent Labs Lab 03/07/14 0440  NA 138  K 4.2  CL 94*  CO2 25  GLUCOSE 272*  BUN  7  CREATININE 0.96  CALCIUM 9.0   Liver Function Tests: No results found for this basename: AST, ALT, ALKPHOS, BILITOT, PROT, ALBUMIN,  in the last 168 hours No results found for this basename: LIPASE, AMYLASE,  in the last 168 hours No results found for this basename: AMMONIA,  in the last 168 hours CBC:  Recent Labs Lab 03/04/14 0454 03/05/14 0440 03/06/14 0542 03/07/14 0440  WBC 7.4 4.8 5.7  --   HGB 6.4* 6.0* 6.5* 7.4*  HCT 23.9* 22.0* 24.8* 27.5*  MCV 76.8* 75.9* 77.7*  --   PLT 230 206 220  --    Cardiac Enzymes: No results found for this basename: CKTOTAL, CKMB, CKMBINDEX, TROPONINI,  in the last 168 hours BNP (last 3 results) No results found for this basename: PROBNP,  in the last 8760 hours CBG:  Recent Labs Lab 03/09/14 0032 03/09/14 0832 03/09/14 1207 03/09/14 1714 03/09/14 2202  GLUCAP 72 138* 105* 80 155*    Recent Results (from the past 240 hour(s))  URINE CULTURE     Status: None   Collection Time    03/01/14  3:00 PM      Result Value Ref Range Status   Specimen Description URINE, CLEAN CATCH   Final   Special Requests NONE   Final   Culture  Setup Time     Final   Value: 03/01/2014 21:20      Performed at [facility] Count     Final   Value: 50,000 COLONIES/ML     Performed at [facility]   Culture     Final   Value: Multiple bacterial morphotypes present, none predominant. Suggest appropriate recollection if clinically indicated.     Performed at [facility]   Report Status 03/03/2014 FINAL   Final     Studies: No results found.  Scheduled Meds: . buPROPion  300 mg Oral Daily  . docusate sodium  200 mg Oral BID  . fluticasone  1 spray Each Nare Daily  . gabapentin  300 mg Oral BID  . hydrocortisone cream   Topical TID  . insulin aspart  0-9 Units Subcutaneous TID WC  . insulin aspart protamine- aspart  17 Units Subcutaneous BID WC  . levothyroxine  300 mcg Oral QAC breakfast  . loratadine  10 mg Oral Daily  . lubiprostone  24 mcg Oral BID WC  . methadone  30 mg Oral 3 times per day  . methylnaltrexone  8 mg Subcutaneous QODAY  . nystatin ointment   Topical BID  . oxybutynin  5 mg Oral TID  . polyethylene glycol  17 g Oral BID  . polyvinyl alcohol  1 drop Both Eyes BID  . sodium chloride  3 mL Intravenous Q12H  . sucralfate  1 g Oral TID WC & HS  . tiZANidine  4 mg Oral QPM  . traZODone  100 mg Oral QHS  . venlafaxine XR  75 mg Oral Q breakfast   Continuous Infusions:       Time spent: 35 min    Estanislado Surgeon K  [facility] Pager [phone]. If 7PM-7AM, please contact night-coverage at [email or web address], password [facility] 03/10/2014, 8:09 AM  LOS: 16 days

## 2014-03-10 NOTE — Progress Notes (Signed)
Dr. Thedore MinsSingh aware via phone of pt's recent CBG of 122 and lunch time CBG of 73. Pt due to receive 17 Units of Novolog Mix 70/30. See new orders received in EPIC.

## 2014-03-11 ENCOUNTER — Other Ambulatory Visit: Payer: Self-pay | Admitting: *Deleted

## 2014-03-11 ENCOUNTER — Telehealth (HOSPITAL_COMMUNITY): Payer: Self-pay | Admitting: *Deleted

## 2014-03-11 LAB — GLUCOSE, CAPILLARY
GLUCOSE-CAPILLARY: 122 mg/dL — AB (ref 70–99)
GLUCOSE-CAPILLARY: 137 mg/dL — AB (ref 70–99)
Glucose-Capillary: 102 mg/dL — ABNORMAL HIGH (ref 70–99)

## 2014-03-11 MED ORDER — DIAZEPAM 2 MG PO TABS: ORAL_TABLET | ORAL | Status: DC

## 2014-03-11 MED ORDER — OXYCODONE HCL 5 MG PO TABS: ORAL_TABLET | ORAL | Status: DC

## 2014-03-11 MED ORDER — INSULIN NPH ISOPHANE & REGULAR (70-30) 100 UNIT/ML ~~LOC~~ SUSP: 25.0000 [IU] | Freq: Two times a day (BID) | SUBCUTANEOUS | Status: AC

## 2014-03-11 MED ORDER — DIAZEPAM 10 MG PO TABS: 10.0000 mg | ORAL_TABLET | Freq: Every evening | ORAL | Status: DC | PRN

## 2014-03-11 MED ORDER — METHADONE HCL 10 MG PO TABS: ORAL_TABLET | ORAL | Status: DC

## 2014-03-11 NOTE — Progress Notes (Signed)
Report called [facility].  Pt to be discharged via transportation services.  [facility] verbalized knowing the pt well.  Pt understands that she is to be discharged back [facility] today.  Pt told by nurse prior to discharge that she was only going to be allowed to take a couple of personal belonging bags with her.  Transportation arrived and told her she was only allowed 2 bags with her and pt became very upset stating that she needed all her things.  Pt was finally convinced to condense her belongings she wanted now into 2 bags.  Pt's parents should be here to collect the remaining items left at the nurses station.

## 2014-03-11 NOTE — Telephone Encounter (Signed)
[facility] 

## 2014-03-11 NOTE — Telephone Encounter (Signed)
[facility] 

## 2014-03-11 NOTE — Telephone Encounter (Signed)
[facility]

## 2014-03-11 NOTE — Progress Notes (Signed)
CSW received call from [facility] stating that they no longer have a bed available for this pt. Bed was needed for another pt. [facility] contacted and Administrator is willing to accept this pt back today. Pt informed . She is not pleased with this news and states she wants to go to [facility]. Only [facility] available at this time [facility]. Pt is aware.   Cori RazorJamie Kenlei Safi LCSW

## 2014-03-11 NOTE — Progress Notes (Signed)
CSW assisting with d/c planning. [facility] has a SNF bed for pt today.  Cori RazorJamie Ulani Degrasse LCSW [phone]

## 2014-03-11 NOTE — Progress Notes (Signed)
[facility] PROGRESS NOTE    Karen BarrioChristy Walls ZOX:[id number] DOB: 06/13/1961 DOA: 02/22/2014 PCP: Terald SleeperOBSON,MICHAEL GAVIN, MD    Interim summary:    53 year old female with multiple medical problems who is currently residing at skilled nursing facility was brought to the hospital with fever and generalized weakness. In the ED, patient was found to have abnormal UA as well as positive stool for occult blood. She also found to have hemoglobin of 7.6, she received one dose of Fortaz in the ED. CT scan of the abdomen was done, which showed abundant colonic stool and no other significant abnormality. She was started on IV fortaz later on transitioned to oral ciprofloxacin on 3/4. She refused to take ciprofloxacin and reported that oral antibiotics would not work. It was later on changed to IV fortaz. She currently completed 8 days of IV fortaz for the treatment of the pseudomonas UTI. Repeat Urine cultures from the supra pubic catheter showed only 50,000 colonies with multiple bacterial morphotypes. She remained afebrile and WBC count is normal.    In view of her anemia and heme positive stools, GI consulted initially , Dr Elnoria HowardHung saw pt and recommended colonoscopy. But she stated that she wanted a few weeks to think about it, that's when Dr Elnoria HowardHung signed off. Then next day she reported she wanted to get the procedure done . Dr Elnoria HowardHung was called back and he was kind enough to put her on schedule for 3/5 after bowel brep. She did not drink her bowel prep on the night of 3/4 and colonoscopy was cancelled and Dr Elnoria HowardHung signed off. Dr Bosie ClosSchooler was on call and I have requested him to see if we can put her on schedule for the procedure.  Since she is on a lot of pain medications and narcotics, she might need propofol and she is scheduled for  colonoscopy in [place] , on Monday. But she continued to have solid bm's and Gi was uable to proceed with her endoscopic procedures. Her bowel prep was continued on Monday and she was  scheduled on Tuesday. She went to OR, underwent EGD, which showed gastritis. Colonoscopy was incomplete because of poor prep, despite 2 gallons of golytely and tap water enemas and two days of clear liquid diet. Dr Dulce Sellarutlaw recommended clear liquids and another gallon of golytely today with colonoscopy scheduled in am. But patient is upset about the fact that she did not have  Solid meals for 2 days in a row and wanted to eat solid food.    Meanwhile because of her h/o depression,  psychiatry consulted and they made No medication changes. Palliative care consulted for goals of care on 2/28. She is DNR. She continues to refuse a blood transfusion. Described to her mother in detail on 03/06/2014, mother agrees that patient is competent to make her decisions, psychiatry as deemed her to have the capacity to make her own decisions.   Patient continues to be declining medical interventions on a regular basis including colonoscopy prep which she has refused several times, GI and hospitalist service counselor several times without much benefit. Colonoscopy was unsuccessful due to poor prep. Continues to be changing her mind about medical treatment-reuses one moment then agrees later, has done it persistently over 3-4 times with at least 4 different MDs.     She was discharged as medically stable with nothing else to offer in the inpatient setting, patient refuses to leave, CM to arrange DC.   Patient openly voices her opinion that her goal is  to stay in the hospital for as long as she can, she will do anything to struck her discharge, she says all she wants is a small private home to live in and she hates going to a nursing home or rehabilitation.      Assessment/Plan:    Anemia   Does not want blood transfusion. Possibly, Secondary to GI bleed, patient has guaiac positive stools.  Earlier patient had refused  for endoscopy and colonoscopy, but now wants to have colonoscopy, called and discussed with  Dr Elnoria Howard, who put her on the schedule on 3/5. But she refused to drink the prep and the colonoscopy on 3/5 was cancelled. Dr Elnoria Howard signed off,. Pt became all teary eyed and reported that she doesn't know that she has to drink the prep/golytely. She promised to drink the prep and go ahead with the colonoscopy.    Patient continues to be declining medical interventions on a regular basis including colonoscopy prep which she has refused several times, GI and hospitalist service counselor several times without much benefit. Colonoscopy was unsuccessful due to poor prep. Barium enema stable except for large amount of stool, continue aggressive bowel regimen which he continues to not cooperate with, also refuses to reduce her narcotic dosages despite extensive explaining and counseling. She has been placed on Relestor on 03/07/2014.   I again discussed possibility of packed RBC transfusion, iron supplementation and transfusion,Aranesp she refused all on 03/06/2014. She understands that she could die. Says does not want any heroics except medications directed towards comfort and diagnostic colonoscopy. She wishes to remain DO NOT RESUSCITATE. Mother updated in detail. Psych has declared her to be competent to make her own decisions. She has been warned multiple times that her refusal to cooperate with medical treatment can certainly insure her death or disability, she says that she wants to be comfortable and doesn't mind passing away wants to be DO NOT RESUSCITATE, she claims she had been seen by hospice before and if she declines further she would like to see them again.      UTI   Patient has history of chronic UTI and has a suprapubic catheter in place.  Completed 8 days of IV fortaz.   Chr SP Catheter - changed 03-10-14.     Left parotid swelling  CT maxillofacial was done which showed mildly prominent lymph nodes over the left parotid gland without associated inflammatory change, no evidence of  abscess findings may be seen in nonspecific parotitis, which could be inflammatory versus infectious etiology. At this time no acute issue, can follow up ENT as outpatient.     Depression   Patient is severely depressed, .  She also has lot of anger against her partner, and also is very unhappy with the care provided at the nursing facility. Psychiatry  consulted to adjust the patient's medications for depression. We'll continue patient's home regimen for depression including Effexor, Wellbutrin. She is not suicidal and appears competent for making her decisions. Clearly states that she wants no heroic measures and wants to be comfortable only, agreeable for general medical treatment and diagnostic colonoscopy. Continue outpatient psych followup post discharge.  Psych has declared her to be competent.    Chronic pain syndrome   Patient has history of Lyme disease, fibromyalgia and has been on methadone 10 mg by mouth 3 times a day along with oxycodone when necessary for pain. She refuses to allow [place] to titrate her pain medications.     Diabetes mellitus  Patient wants to eat regular food, as she feels that she doesn't have much quality of life. Decreased the dose of  the Novolin 70/30 along with sliding scale insulin.  CBG (last 3)   Recent Labs  03/10/14 1645 03/10/14 2145 03/11/14 0732  GLUCAP 122* 137* 102*         Constipation   Likely narcotic induced, will repeat abdominal x-ray, no obstruction we'll give 1 dose Relostor along with aggressive bowel regimen. She refuses to cut down on pain medications. Despite counseling. Noncompliant with bowel regimen in prescribed medications on a persistent basis despite being counseled by 2 different hospitalist physician's, 2 different GI physicians and multiple nursing staff. Some better on relastor proving this is narcotic Bowel.    Abdominal pain   Patient has history of chronic UTI with suprapubic catheter in place. CT bone  pelvis negative for acute pathology. Likely combination of constipation and chronic UTI. We'll continue with the methadone and when necessary oxycodone.    Hypokalemia  Repleted. He check BMP in the morning     Scalp wound   Most likely self-inflicted, as patient complains of skin tags hanging around for head. And she admits to scratching on the wound. We'll continue with the hydroxyzine when necessary for itching. Patient needs to follow up Dermatology as outpatient.    Hypothyroidism;  Continue with synthroid. TSH abnormal. But free t4 normal. Repeat TSH in 4-6 weeks      Code Status:   Family Communication: Discussed with patient and her  Who says her daughter is extremely diffcult, they do not get along and she does not listen to her advice.   Disposition Plan: SNF possibly on wednesday after colonoscopy.   DVT prophylaxis SCD   Consultants:  Palliative care  Gastroenterology.  Dr Dulce Sellar  Psychiatry.   Procedures:  Colonoscopy  3/11, barium enema  Antibiotics:  Completed the course of IV fortaz    HPI/Subjective:  In bed appears comfortable, denies any headache chest or abdominal pain at this time. She has chronic feeling of suprapubic fullness. She says she wants colonoscopy to find out where abdominal pain is coming from on an intermittent basis, she does not want any further heroics, says 1 Gen. medical treatment directed towards comfort. Wishes to be DO NOT RESUSCITATE. Does not want transfusions including PRBC or iron, says she would rather die than get transfusions. She does not want anything that will prolong her life. Except gentle medical treatment for comfort.   I again discussed possibility of packed RBC transfusion, iron supplementation and transfusion,Aranesp she refused all on 03/06/2014. She understands that she could die. Says does not want any heroics except medications directed towards comfort and diagnostic colonoscopy. She wishes to  remain DO NOT RESUSCITATE. Mother updated in detail. Competent per Psych.   Objective: Filed Vitals:   03/11/14 0845  BP: 124/76  Pulse: 82  Temp: 98.7 F (37.1 C)  Resp: 18    Intake/Output Summary (Last 24 hours) at 03/11/14 1003 Last data filed at 03/11/14 1000  Gross per 24 hour  Intake    240 ml  Output   2900 ml  Net  -2660 ml   Filed Weights   02/22/14 1710 03/06/14 1314  Weight: 127.914 kg (282 lb) 127.914 kg (282 lb)    Exam:  Physical Exam: wake and alert.  Eyes: No signs of jaundice, EOMI Nose: Mucous membranes moist.  Throat: Oropharynx nonerythematous, no exudate appreciated.  Neck: supple,No deformities, masses, or tenderness noted. Lungs:  Normal respiratory effort. B/L Clear to auscultation, no crackles or wheezes.  Heart: Regular RR. S1 and S2 normal  Abdomen: BS normoactive. Soft, Nondistended, non-tender. SP catheter in place site stable. Extremities: No pretibial edema, no erythema Psych. Insight is intact, she is alert awake, not suicidal.   Data Reviewed: Basic Metabolic Panel:  Recent Labs Lab 03/07/14 0440  NA 138  K 4.2  CL 94*  CO2 25  GLUCOSE 272*  BUN 7  CREATININE 0.96  CALCIUM 9.0   Liver Function Tests: No results found for this basename: AST, ALT, ALKPHOS, BILITOT, PROT, ALBUMIN,  in the last 168 hours No results found for this basename: LIPASE, AMYLASE,  in the last 168 hours No results found for this basename: AMMONIA,  in the last 168 hours CBC:  Recent Labs Lab 03/05/14 0440 03/06/14 0542 03/07/14 0440  WBC 4.8 5.7  --   HGB 6.0* 6.5* 7.4*  HCT 22.0* 24.8* 27.5*  MCV 75.9* 77.7*  --   PLT 206 220  --    Cardiac Enzymes: No results found for this basename: CKTOTAL, CKMB, CKMBINDEX, TROPONINI,  in the last 168 hours BNP (last 3 results) No results found for this basename: PROBNP,  in the last 8760 hours CBG:  Recent Labs Lab 03/10/14 0828 03/10/14 1251 03/10/14 1645 03/10/14 2145 03/11/14 0732  GLUCAP  143* 73 122* 137* 102*    Recent Results (from the past 240 hour(s))  URINE CULTURE     Status: None   Collection Time    03/01/14  3:00 PM      Result Value Ref Range Status   Specimen Description URINE, CLEAN CATCH   Final   Special Requests NONE   Final   Culture  Setup Time     Final   Value: 03/01/2014 21:20     Performed at [facility] Count     Final   Value: 50,000 COLONIES/ML     Performed at [facility]   Culture     Final   Value: Multiple bacterial morphotypes present, none predominant. Suggest appropriate recollection if clinically indicated.     Performed at [facility]   Report Status 03/03/2014 FINAL   Final     Studies: No results found.  Scheduled Meds: . buPROPion  300 mg Oral Daily  . docusate sodium  200 mg Oral BID  . fluticasone  1 spray Each Nare Daily  . gabapentin  300 mg Oral BID  . hydrocortisone cream   Topical TID  . insulin aspart  0-9 Units Subcutaneous TID WC  . insulin aspart protamine- aspart  14 Units Subcutaneous BID WC  . levothyroxine  300 mcg Oral QAC breakfast  . loratadine  10 mg Oral Daily  . lubiprostone  24 mcg Oral BID WC  . methadone  30 mg Oral 3 times per day  . methylnaltrexone  8 mg Subcutaneous QODAY  . nystatin ointment   Topical BID  . oxybutynin  5 mg Oral TID  . polyethylene glycol  17 g Oral BID  . polyvinyl alcohol  1 drop Both Eyes BID  . sodium chloride  3 mL Intravenous Q12H  . sucralfate  1 g Oral TID WC & HS  . tiZANidine  4 mg Oral QPM  . traZODone  100 mg Oral QHS  . venlafaxine XR  75 mg Oral Q breakfast   Continuous Infusions:       Time spent: 35 min  Leroy Sea  [facility] Pager [phone]. If 7PM-7AM, please contact night-coverage at [email or web address], password [facility] 03/11/2014, 10:03 AM  LOS: 17 days

## 2014-03-12 ENCOUNTER — Other Ambulatory Visit: Payer: Self-pay | Admitting: *Deleted

## 2014-03-12 ENCOUNTER — Non-Acute Institutional Stay (SKILLED_NURSING_FACILITY): Payer: Medicare Other | Admitting: Internal Medicine

## 2014-03-12 DIAGNOSIS — E1159 Type 2 diabetes mellitus with other circulatory complications: Secondary | ICD-10-CM

## 2014-03-12 DIAGNOSIS — D500 Iron deficiency anemia secondary to blood loss (chronic): Secondary | ICD-10-CM

## 2014-03-12 DIAGNOSIS — L989 Disorder of the skin and subcutaneous tissue, unspecified: Secondary | ICD-10-CM

## 2014-03-12 MED ORDER — OXYCODONE HCL 10 MG PO TABS: ORAL_TABLET | ORAL | Status: DC

## 2014-03-12 MED ORDER — DIAZEPAM 10 MG PO TABS: ORAL_TABLET | ORAL | Status: DC

## 2014-03-12 MED ORDER — METHADONE HCL 10 MG PO TABS: ORAL_TABLET | ORAL | Status: DC

## 2014-03-12 NOTE — Telephone Encounter (Signed)
[facility] 

## 2014-03-12 NOTE — Telephone Encounter (Signed)
[facility] 

## 2014-03-18 NOTE — Progress Notes (Addendum)
Patient ID: Karen BarrioChristy Walls, female   DOB: 07/29/1961, 53 y.o.   MRN: [id number]                   HISTORY & PHYSICAL  DATE:  03/12/2014    [facility]    LEVEL OF CARE:   SNF   CHIEF COMPLAINT:  Return following an extensive admission to [facility].  I believe the dates were 02/22/2014 through 03/11/2014.    HISTORY OF PRESENT ILLNESS:  This is a 53 year-old patient who was sent to this facility, I believe, in September 2014.  She came to us, I believe, from another facility with information from an acute hospital near [place], I believe.    Her major issues in the facility include iron deficiency anemia, generally running a hemoglobin at 6-7.  The patient adamantly refused iron in any form.  Would not allow stool guaiacs.  Therefore, I did not seek outside consultation.  Although she had been transfused in the past, according to her, she also refused blood transfusions.    She also has chronic pain syndrome, on methadone; relative immobility although she can transfer from bed to her electric wheelchair.    Shortly before she was transferred to hospital, she was noted to have a Pseudomonas UTI and also what appeared to be acute parotitis on the left.  She was put on antibiotics for both of these.  However, she ended up in [facility].    In [facility], she was noted to be iron deficient.  A work-up by Dr. Dulce Sellarutlaw was done.  She had a reasonably normal endoscopy.  A colonoscopy could only be done to the distal transverse colon where impacted stool was seen.    A CT scan of her head and neck showed parotitis with surrounding lymphadenopathy.  A follow-up CT scan was ordered, although she appears to be considerably better than when I last saw this.    She also has a fairly substantial wound on the thinner part of her forehead.  It looks more necrotic than before she went out and she has a follow-up Dermatology appointment for this.    She also had a CT scan of her abdomen that  showed abundant colonic stool with extension of the rectum, measured at least 8 cm.  She was also felt to have probable fibroid uterus, a suprapubic catheter, distended gallbladder without gallstones, no hydronephrosis, no small bowel obstruction.    She also had a CT scan of her maxillofacial area and this showed several mildly prominent lymph nodes over the left parotid gland without significant inflammatory change.  No evidence of an abscess.  She was felt to have nonspecific parotitis with malignancy less likely than infectious or autoimmune.    Past Medical History  Diagnosis Date  . Diabetes mellitus without complication   . Chronic pain   . Morbid obesity   . OSA (obstructive sleep apnea)   . Generalized weakness   . Fibromyalgia   . Lyme disease   . Anemia   . History of blood transfusion     pt states she has had 5 blood transfusions  . Sleep disorder   . UTI (lower urinary tract infection) 02/22/2014  . Hypertension   . Hypothyroidism   . COPD (chronic obstructive pulmonary disease)   . Shortness of breath   . GERD (gastroesophageal reflux disease)    Past Surgical History  Procedure Laterality Date  . Explaratory      on stomach,   .  Appendectomy    . Foot surgery      due to stepping on broken glass  . Spinal tap    . Supra pubic catheter    . Esophagogastroduodenoscopy (egd) with propofol N/A 03/05/2014    Procedure: ESOPHAGOGASTRODUODENOSCOPY (EGD) WITH PROPOFOL;  Surgeon: Willis Modena, MD;  Location: [facility];  Service: Endoscopy;  Laterality: N/A;  . Colonoscopy with propofol N/A 03/05/2014    Procedure: COLONOSCOPY WITH PROPOFOL;  Surgeon: Willis Modena, MD;  Location: [facility];  Service: Endoscopy;  Laterality: N/A;  . Dental restoration/extraction with x-ray    . Colonoscopy with propofol N/A 03/06/2014    Procedure: COLONOSCOPY WITH PROPOFOL;  Surgeon: Willis Modena, MD;  Location: [facility];  Service: Endoscopy;  Laterality: N/A;      CURRENT  MEDICATIONS:  Medication list is extensive and reviewed.    SOCIAL HISTORY:   HOUSING:  The patient was at one point co-habitating with a significant other.  There have been problems here.  I believe her Medicaid was eventually transferred here, although she is not happy about it.   She wishes to have her own small apartment, although she is concerned she does not have the finances to support this.  In reality, I cannot see this lady anywhere but in a nursing home at this point.    REVIEW OF SYSTEMS:   HEENT:   HEAD:  Her left-sided facial pain is better.   CHEST/RESPIRATORY:  No shortness of breath.  CARDIAC:   No chest pain.    GI:  States she still has not had a bowel movement  PHYSICAL EXAMINATION:   GENERAL APPEARANCE:  The patient is awake.   Looks well.   HEENT:   HEAD:  The tenderness at the angle of her jaw on the left is much better, although there is still a rubbery mass, presumably her parotid.   CHEST/RESPIRATORY:  Shallow, but otherwise clear air entry.   CARDIOVASCULAR:  CARDIAC:   Heart sounds are normal.  She appears to be euvolemic.   GASTROINTESTINAL:  ABDOMEN:   Massively distended secondary to obesity and possible impaction.   LIVER/SPLEEN/KIDNEYS:  No liver, no spleen.   HERNIA:  I think she has a small ventral hernia just above her umbilicus.   CIRCULATION:   EDEMA/VARICOSITIES:  Extremities:  No evidence of a DVT.   PSYCHIATRIC:   MENTAL STATUS:   She is much as before.  Somewhat depressed, although I think she is improved.  I note she takes Effexor XR.    ASSESSMENT/PLAN:  Severe iron deficiency anemia.  Her discharge hemoglobin was 7.4.  Once again, she refuses blood transfusions or iron in any form.  She states she "does not want to prolong her life", although she is not actively depressed or suicidal.   This is similar to what she stated previously.  I note she was seen by Psychiatry in [facility].    Generalized pain disorder.  Comes back on Neurontin 300  b.i.d., methadone 30 mg every 8 hours which is unchanged.    Type 2 diabetes.  On insulin 70/30, 25 U twice daily.    Now necrotic-looking wound on her upper forehead.  I wonder whether this is an underlying malignancy or simply could be repetitively traumatized by the patient.  I agree with follow-up Dermatology consult if she will allow this.    Chronic narcotic-induced constipation.  She is on Amitiza 24 mcg b.i.d.  I will try to augment that regimen.    Chronic Foley  catheter.  She is on Ditropan 5 mg three times a day.  I cannot really see how this would benefit and would certainly worsen her constipation.  I will discontinue this.    CPT CODE: 16109

## 2014-04-08 ENCOUNTER — Other Ambulatory Visit: Payer: Self-pay | Admitting: *Deleted

## 2014-04-08 MED ORDER — METHADONE HCL 5 MG PO TABS: ORAL_TABLET | ORAL | Status: DC

## 2014-04-08 MED ORDER — DIAZEPAM 2 MG PO TABS: ORAL_TABLET | ORAL | Status: DC

## 2014-04-08 NOTE — Telephone Encounter (Signed)
[facility] 

## 2014-04-09 ENCOUNTER — Non-Acute Institutional Stay (SKILLED_NURSING_FACILITY): Payer: Medicare Other | Admitting: Internal Medicine

## 2014-04-09 DIAGNOSIS — L989 Disorder of the skin and subcutaneous tissue, unspecified: Secondary | ICD-10-CM

## 2014-04-09 DIAGNOSIS — D500 Iron deficiency anemia secondary to blood loss (chronic): Secondary | ICD-10-CM

## 2014-04-11 ENCOUNTER — Other Ambulatory Visit: Payer: Self-pay | Admitting: *Deleted

## 2014-04-11 MED ORDER — METHADONE HCL 10 MG PO TABS: ORAL_TABLET | ORAL | Status: DC

## 2014-04-11 NOTE — Telephone Encounter (Signed)
[facility] 

## 2014-04-12 NOTE — Progress Notes (Addendum)
Patient ID: [name], female   DOB: 04/07/1961, 53 y.o.   MRN: [id number]                   PROGRESS NOTE  DATE:  04/09/2014     [facility]AC[facility]     LEVEL OF CARE:   SNF   Acute Visit   CHIEF COMPLAINT:  Multiple complaints,  follow up medical issues.    HISTORY OF PRESENT ILLNESS:  This is a patient who was admitted to hospital for the first two weeks of March.  She was found to have iron deficiency anemia, which we already knew about.  She had a normal endoscopy, and a colonoscopy due to distal transverse colon was normal.    Lab work in the hospital showed a hemoglobin of 6.4 on 03/04/2014 and 6 on 03/05/2014.  She was discharged with a hemoglobin of 7.4.  Once again, she refuses iron in any form and recently has refused transfusions, although she has been transfused in the past.  Her iron studies in the hospital showed an iron of 29, a TIBC of 342, and a ferritin of 6.    She has a multitude of concerns and problems that will be listed below:     A chronic wound on the center part of her forehead at her hairline.  A Dermatology appointment had been arranged for her, but she refuses and states that she will not go to the dermatologist until she "feels better".    Widespread complaints of pain.  This includes her neck, her glands, her chest, abdomen, bladder.    Bladder spasms.  Requesting Ditropan.    The patient was recently put on Rocephin for a UTI, although I do not see this result.  In any case, she is refusing the Rocephin, stating it makes her feel "sick".    REVIEW OF SYSTEMS:  Almost meaningless in this woman as she responds positive everywhere.  However, the meaningful issues include:   GI:  Abdominal pain with asymmetric distention.   GU:  Bladder spasms and pain, although she is refusing her antibiotics.    MUSCULOSKELETAL:  Widespread complaints of pain for which she has been on methadone.  I am not quite certain why her methadone was reduced,  although it appears that it was scheduled rather than p.r.n.     PHYSICAL EXAMINATION:   GENERAL APPEARANCE:  The patient is not in any distress.   HEENT:   HEAD:  Has a necrotic wound in the central part of her forehead as described.  I would like this to be biopsied in Dermatology, although for now she has not agreed to go.   CHEST/RESPIRATORY:  Clear air entry bilaterally.    CARDIOVASCULAR:  CARDIAC:   Heart sounds are normal.  There are no murmurs.   GASTROINTESTINAL:  ABDOMEN:    Massively obese.  However, she does seem to have some asymmetry, being firmer on the right side than the left.  She states she is having bowel movements that are formed.    ASSESSMENT/PLAN:  Necrotic wound on her central forehead.  Again, she will not agree to see Dermatology until she "feels better".    Widespread complaints of pain.  Listed previously as a chronic pain syndrome.  On methadone 30 mg q.8 p.r.n.  I will increase her routine methadone to 20 mg q.8. She has generalized OA. If there is another explanation for her pain I don't see it  Bladder spasms with a chronic  Foley catheter.  I will put her on Ditropan 5 mg b.i.d.    Treatment for a recent UTI, although she is refusing the Rocephin.  I will need to see if I can track down the culture result.    CPT CODE: [id number]

## 2014-05-07 ENCOUNTER — Non-Acute Institutional Stay (SKILLED_NURSING_FACILITY): Payer: Medicare Other | Admitting: Internal Medicine

## 2014-05-07 DIAGNOSIS — D500 Iron deficiency anemia secondary to blood loss (chronic): Secondary | ICD-10-CM

## 2014-05-07 DIAGNOSIS — K1120 Sialoadenitis, unspecified: Secondary | ICD-10-CM

## 2014-05-08 ENCOUNTER — Other Ambulatory Visit (HOSPITAL_BASED_OUTPATIENT_CLINIC_OR_DEPARTMENT_OTHER): Payer: Self-pay | Admitting: Internal Medicine

## 2014-05-08 DIAGNOSIS — K111 Hypertrophy of salivary gland: Secondary | ICD-10-CM

## 2014-05-08 NOTE — Progress Notes (Addendum)
Patient ID: Karen BarrioChristy Walls, female   DOB: 08/18/1961, 53 y.o.   MRN: [id number]                  PROGRESS NOTE  DATE:  05/07/2014    [facility]    LEVEL OF CARE:   SNF   Acute Visit   CHIEF COMPLAINT:  Follow up medical issues including iron deficiency anemia, parotitis, bladder spasms.    HISTORY OF PRESENT ILLNESS:  The patient was last in hospital, I believe, in late February/early March.  She was known to have iron deficiency anemia.  She had a normal endoscopy and a relatively normal colonoscopy to the distal transverse colon.  I do not think the rest of this could be observed due to poor prep.  She has consistently been known to have iron deficiency anemia although she refuses iron in any form, stating an "intolerance".  She does not feel well.  She feels weak.  Therefore, she has consented to a transfusion, which I will arrange.    She also had parotitis before her most recent hospitalization.  She had a CT scan showing enlargement of some glands in the area as well as the gland itself.  A follow-up CT scan for 6-8 weeks was suggested and, although we are a bit late with this, I will arrange this.    She continues to have bladder spasms although we have put her on Ditropan.  She has a suprapubic catheter.  She has multiple complaints and is concerned she could have a UTI.  I am almost hesitant to order this as it is likely to be positive and multidrug resistant and her symptoms are less than 100% specific, although I do not think I have any choice but to go ahead and honor this request.    Finally, she is complaining of bilateral eye pain and some decline in her visual acuity.  She does not have a history of glaucoma.  Does say she has a history of pseudotumor cerebri, although I do not have this anywhere on her problem list.    PHYSICAL EXAMINATION:   GENERAL APPEARANCE:  The patient is not in any distress.   GASTROINTESTINAL:  ABDOMEN:   Very distended.  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   SKIN:  INSPECTION:  She still has a necrotic wound in the central part of her forehead.  There are actually two wound areas.  She was biopsied by Dermatology although I have not seen these results, nor do I see the pathology in [facility].    ASSESSMENT/PLAN:  Parotitis with surrounding lymphadenopathy.  I have reordered a CT scan of her head and neck.     Iron deficiency anemia.  She has consented to a transfusion, which I will arrange.  Last hemoglobin I see was 7 from 04/15/2014.    Eye pain, lack of visual acuity.  I think she needs to see Ophthalmology.  Her eye exam today looks fine.  However, I think she needs her ocular pressures checked.  The etiology of her problem is not really clear to me.

## 2014-05-10 ENCOUNTER — Other Ambulatory Visit: Payer: Self-pay | Admitting: *Deleted

## 2014-05-10 MED ORDER — METHADONE HCL 10 MG PO TABS: ORAL_TABLET | ORAL | Status: DC

## 2014-05-10 NOTE — Telephone Encounter (Signed)
[facility] 

## 2014-05-13 ENCOUNTER — Other Ambulatory Visit: Payer: Self-pay | Admitting: *Deleted

## 2014-05-13 MED ORDER — METHADONE HCL 10 MG PO TABS
ORAL_TABLET | ORAL | Status: DC
Start: 2014-05-13 — End: 2014-05-31

## 2014-05-13 NOTE — Telephone Encounter (Signed)
[facility] 

## 2014-05-14 ENCOUNTER — Other Ambulatory Visit: Payer: Self-pay

## 2014-05-14 ENCOUNTER — Non-Acute Institutional Stay (SKILLED_NURSING_FACILITY): Payer: Medicare Other | Admitting: Internal Medicine

## 2014-05-14 DIAGNOSIS — D500 Iron deficiency anemia secondary to blood loss (chronic): Secondary | ICD-10-CM

## 2014-05-14 DIAGNOSIS — K1120 Sialoadenitis, unspecified: Secondary | ICD-10-CM

## 2014-05-14 DIAGNOSIS — L989 Disorder of the skin and subcutaneous tissue, unspecified: Secondary | ICD-10-CM

## 2014-05-16 ENCOUNTER — Ambulatory Visit (HOSPITAL_COMMUNITY)
Admission: RE | Admit: 2014-05-16 | Discharge: 2014-05-16 | Disposition: A | Discharge status: Home / Self Care | Payer: Medicare Other | Source: Ambulatory Visit | Attending: Internal Medicine | Admitting: Internal Medicine

## 2014-05-16 DIAGNOSIS — D509 Iron deficiency anemia, unspecified: Secondary | ICD-10-CM | POA: Diagnosis present

## 2014-05-16 LAB — ABO/RH: ABO/RH(D): A POS

## 2014-05-16 NOTE — Progress Notes (Addendum)
Patient ID: [name], female   DOB: 06/10/1961, 53 y.o.   MRN: [id number]                  PROGRESS NOTE  DATE:  05/14/2014    [facility]AC[facility]    LEVEL OF CARE:   SNF   Acute Visit   CHIEF COMPLAINT:  Follow up issues from last week.    HISTORY OF PRESENT ILLNESS:  [name] has a multitude of issues that I spent some time discussing with her last week.    First of all, she complained of low back pain, in the flanks, suprapubic pain.  She has a chronic catheter.  Urine, of course, grew [name] which is multi-drug resistant but sensitive to third-generation cephalosporins, aminoglycosides.  It is very difficult to know whether this woman is symptomatic or not.  However, she today is insistent that she needs antibiotics and is concerned that she has pyelonephritis.  She refuses injections, i.e. IM injections, and wants an IV.  I would be worried about actually getting this and maintaining this.  Therefore, I will try an oral third-generation cephalosporin.  I am aware of her penicillin allergies.    With regards to her left-sided parotitis, there is a follow-up CT scan on June 19th.    With regards to transfusions, with some difficulty [facility].  She has severe iron deficiency anemia and refuses iron.  Her hemoglobin runs between 6 and slightly over 7.  I would like to transfuse her.     She also has seen Dermatology with regards to a lesion on her upper central forehead.  The pathology has not come back.  We have not had follow-up.   This is a nonhealing area.  The big concern would be to rule out malignancy here.    PHYSICAL EXAMINATION:   GASTROINTESTINAL:  ABDOMEN:   She is distended and tight.  Has had problems with impaction in the past.   GENITOURINARY:  BLADDER:   She is not in urinary retention.  There is no clear CVA tenderness.    ASSESSMENT/PLAN:  Infection versus colonization with [name].  I could spend a long period of time  with her and be no more convinced about this in either direction.  I am going to put her on an oral third-generation cephalosporin, possibly Omnicef.    Follow-up of medical issues including parotitis, anemia, nonhealing lesion on her forehead, all of which has been discussed with her today.

## 2014-05-17 ENCOUNTER — Encounter (HOSPITAL_COMMUNITY)
Admission: RE | Admit: 2014-05-17 | Discharge: 2014-05-17 | Disposition: A | Discharge status: Home / Self Care | Payer: Medicare Other | Source: Ambulatory Visit | Attending: Internal Medicine | Admitting: Internal Medicine

## 2014-05-17 ENCOUNTER — Other Ambulatory Visit (HOSPITAL_COMMUNITY): Payer: Self-pay | Admitting: Internal Medicine

## 2014-05-17 ENCOUNTER — Encounter (HOSPITAL_COMMUNITY): Payer: Self-pay

## 2014-05-17 DIAGNOSIS — D509 Iron deficiency anemia, unspecified: Secondary | ICD-10-CM | POA: Diagnosis not present

## 2014-05-17 LAB — PREPARE RBC (CROSSMATCH)

## 2014-05-17 LAB — GLUCOSE, CAPILLARY: Glucose-Capillary: 124 mg/dL — ABNORMAL HIGH (ref 70–99)

## 2014-05-17 MED ORDER — SODIUM CHLORIDE 0.9 % IV SOLN
INTRAVENOUS | Status: DC
  Administered 2014-05-17: 250 mL via INTRAVENOUS

## 2014-05-17 NOTE — Discharge Instructions (Signed)
Blood Transfusion  A blood transfusion replaces your blood or some of its parts. Blood is replaced when you have lost blood because of surgery, an accident, or for severe blood conditions like anemia. You can donate blood to be used on yourself if you have a planned surgery. If you lose blood during that surgery, your own blood can be given back to you. Any blood given to you is checked to make sure it matches your blood type. Your temperature, blood pressure, and heart rate (vital signs) will be checked often.  GET HELP RIGHT AWAY IF:   You feel sick to your stomach (nauseous) or throw up (vomit).  You have watery poop (diarrhea).  You have shortness of breath or trouble breathing.  You have blood in your pee (urine) or have dark colored pee.  You have chest pain or tightness.  Your eyes or skin turn yellow (jaundice).  You have a temperature by mouth above 102 F (38.9 C), not controlled by medicine.  You start to shake and have chills.  You develop a a red rash (hives) or feel itchy.  You develop lightheadedness or feel confused.  You develop back, joint, or muscle pain.  You do not feel hungry (lost appetite).  You feel tired, restless, or nervous.  You develop belly (abdominal) cramps. Document Released: 03/11/2009 Document Revised: 03/06/2012 Document Reviewed: 03/11/2009 [employer] Patient Information 2014 [employer].  

## 2014-05-19 LAB — TYPE AND SCREEN
ABO/RH(D): A POS
Antibody Screen: POSITIVE
DAT, IGG: NEGATIVE
UNIT DIVISION: 0
Unit division: 0

## 2014-05-23 ENCOUNTER — Other Ambulatory Visit (HOSPITAL_BASED_OUTPATIENT_CLINIC_OR_DEPARTMENT_OTHER): Payer: Self-pay | Admitting: Internal Medicine

## 2014-05-23 DIAGNOSIS — K111 Hypertrophy of salivary gland: Secondary | ICD-10-CM

## 2014-05-27 ENCOUNTER — Other Ambulatory Visit: Payer: Self-pay | Admitting: *Deleted

## 2014-05-27 MED ORDER — OXYCODONE HCL 10 MG PO TABS: ORAL_TABLET | ORAL | Status: DC

## 2014-05-27 NOTE — Telephone Encounter (Signed)
[facility] 

## 2014-05-28 ENCOUNTER — Inpatient Hospital Stay: Admission: RE | Admit: 2014-05-28 | Payer: Self-pay | Source: Ambulatory Visit

## 2014-05-31 ENCOUNTER — Other Ambulatory Visit: Payer: Self-pay | Admitting: *Deleted

## 2014-05-31 MED ORDER — METHADONE HCL 10 MG PO TABS: ORAL_TABLET | ORAL | Status: DC

## 2014-05-31 NOTE — Telephone Encounter (Signed)
[facility] 

## 2014-06-11 ENCOUNTER — Non-Acute Institutional Stay (SKILLED_NURSING_FACILITY): Payer: Medicare Other | Admitting: Internal Medicine

## 2014-06-11 DIAGNOSIS — K1120 Sialoadenitis, unspecified: Secondary | ICD-10-CM

## 2014-06-11 DIAGNOSIS — L989 Disorder of the skin and subcutaneous tissue, unspecified: Secondary | ICD-10-CM

## 2014-06-11 DIAGNOSIS — D500 Iron deficiency anemia secondary to blood loss (chronic): Secondary | ICD-10-CM

## 2014-06-17 NOTE — Progress Notes (Addendum)
Patient ID: Karen BarrioChristy Walls, female   DOB: 02/27/1961, 53 y.o.   MRN: [id number]                 PROGRESS NOTE  DATE:  06/11/2014    [facility]    LEVEL OF CARE:   SNF   Acute Visit   CHIEF COMPLAINT:  Follow up medical issues.    HISTORY OF PRESENT ILLNESS:  After my last extensive review of this patient on 05/14/2014, she did not attend the CT scan to follow up on her left-sided parotitis.  I cannot exactly determine why.  She states she was simply "too sick".    She has seen Dermatology with regards to the lesions on the upper central forehead.  The pathology did not show a malignancy.  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  She has topical Silvadene cream.  Since then, the original area has gotten somewhat better, although there are more lesions present just more caudal to the original one.   Apparently, she scratches these areas incessantly.    She is interested in following up on her hemoglobin posttransfusion.  I have not really done this as it becomes a difficult organizational task to try and reset this up.  Once again, I have talked to her about iron and she refuses in all forms.  States her mother has similar intolerances.  She wants to be worked up for myeloma secondary to some skin lesions, etc.    Finally, she feels as though she has burning and pain and she knows she has a UTI.  The last culture we did on her on 05/07/2014 showed Morganella morganii.  She  apparently refused parenteral drugs.  We gave her Suprax 400 mg for seven days.    PHYSICAL EXAMINATION:   SKIN:  INSPECTION:  The areas on her forehead are really disfiguring.  I am not sure what else I can do about this, perhaps a topical steroid to see if we can avoid the constant scratching.  It is comforting to know that this is not a malignancy.  She also shows me several areas on her arms and legs.  There appears to be de-pigmented spots, possibly old wounds from, again, scars from prurigo nodularis.   I do not see anything ominous here.   GASTROINTESTINAL:  ABDOMEN:   Her abdomen is very distended.  I think this is either impaction and/or chronic ileus.  GENITOURINARY:  BLADDER:    There is no suprapubic tenderness, though there may be right costovertebral angle tenderness.  It is difficult to tell.    ASSESSMENT/PLAN:  Severe iron deficiency anemia.  I do not think this patient needs to be worked up for anything further in terms of her iron deficiency.  I have told her frankly that I do not believe she is iron-intolerant.  However, I am not going to win this argument.  We will follow up on her hemoglobin.    ?UTI in a chronically catheterized patient.  I am somewhat reluctant to do this as this is likely to come back with resistant organisms and she will have a long list of medication intolerances.  Nevertheless, I will go ahead and change her catheter and get a urine for culture.    Parotitis.  She refused the CT scan.  I am not precisely sure why.  I am not going to re-arrange this.    Type 2 diabetes.  She is on 70/30, 25 U twice a day, as well as a  sliding scale.   Last hemoglobin A1c on 04/15/2014 was 6.1.  This is satisfactory for now.  At some point in time, I am going to check her urine for protein.  However, right now I think we are satisfactory.

## 2014-07-02 ENCOUNTER — Other Ambulatory Visit: Payer: Self-pay | Admitting: *Deleted

## 2014-07-02 MED ORDER — METHADONE HCL 10 MG PO TABS: ORAL_TABLET | ORAL | Status: DC

## 2014-07-09 ENCOUNTER — Non-Acute Institutional Stay (SKILLED_NURSING_FACILITY): Payer: Medicare Other | Admitting: Internal Medicine

## 2014-07-09 DIAGNOSIS — N10 Acute tubulo-interstitial nephritis: Secondary | ICD-10-CM

## 2014-07-09 NOTE — Progress Notes (Signed)
Patient ID: Theotis BarrioChristy Tantillo, female   DOB: 06/02/1961, 53 y.o.   MRN: [id number] [facility] Chief complaint; question UTI History; the patient has a chronic Foley catheter for reasons that it never really been totally clear to me. In any case she has a large number of very nonspecific complaints which she attributes forcibly to the UTI she knows she has. Problematic than this she does not think that she is capable of receiving intramuscular antibiotics because she has "gotten worse in the past". She adamantly requests that she be treated with IV antibiotics. She is allergic to sulfa Levaquin and penicillin although I see she has tolerated cephalosporins well in the past [Suprax]. The organisms she is repeatedly cultured is Morganella morganii. This is sensitive to third-generation cephalosporins, and aminoglycosides. Gave her a course of Suprax last month for the same organism for 7 days, she states she is no better but once again the complaints are vague nonlocalized and it is quite possible that this is all asymptomatic bacteriuria/colonization in a patient with a chronic Foley catheter. She is certainly not been febrile and has not been ill in a specific way Physical examination Gen. she is not in any distress. When I tried to discuss issues with regards to chronic colonization she shows me her catheter tubing which is dark with some mucousy looking material but once again this is not necessarily indicative of an active infection however she will hear none of this. She does have some complaints of tenderness and costovertebral angle tenderness however I think this is nonspecific and I really am at a loss to really know whether this is an active infection or not  Impression/plan #1 chronic Foley catheter. I am never been exactly sure of the reason for this. I doubt she would entertain the idea of a voiding trial. Her catheter was changed last week.  #2 numerous vague complaints which she relates to  it possible urinary tract infection. Although I am doubtful, I am feeling obligated to go ahead and try and treat this. She states she is made worse by intramuscular antibiotics, I told her I find this not to be believable nevertheless in the interest of completeness of care I am going to give her 2 weeks of an IV third-generation cephalosporin since she will not agree to intramuscular or oral forms. I would resist the notion of retesting this after completion of antibiotics although once again that vagueness of her complaints and the chronicity will make this a challenge.

## 2014-08-05 ENCOUNTER — Other Ambulatory Visit: Payer: Self-pay | Admitting: *Deleted

## 2014-08-05 MED ORDER — METHADONE HCL 10 MG PO TABS: ORAL_TABLET | ORAL | Status: DC

## 2014-08-05 NOTE — Telephone Encounter (Signed)
[facility] 

## 2014-08-22 ENCOUNTER — Encounter: Payer: Self-pay | Admitting: Adult Health Nurse Practitioner

## 2014-08-22 ENCOUNTER — Non-Acute Institutional Stay: Payer: Medicare Other | Admitting: Adult Health Nurse Practitioner

## 2014-08-22 ENCOUNTER — Non-Acute Institutional Stay (SKILLED_NURSING_FACILITY): Payer: Medicare Other | Admitting: Adult Health

## 2014-08-22 DIAGNOSIS — G609 Hereditary and idiopathic neuropathy, unspecified: Secondary | ICD-10-CM

## 2014-08-22 DIAGNOSIS — E1159 Type 2 diabetes mellitus with other circulatory complications: Secondary | ICD-10-CM

## 2014-08-22 DIAGNOSIS — I10 Essential (primary) hypertension: Secondary | ICD-10-CM

## 2014-08-22 DIAGNOSIS — J449 Chronic obstructive pulmonary disease, unspecified: Secondary | ICD-10-CM

## 2014-08-22 DIAGNOSIS — D509 Iron deficiency anemia, unspecified: Secondary | ICD-10-CM

## 2014-08-22 DIAGNOSIS — E039 Hypothyroidism, unspecified: Secondary | ICD-10-CM

## 2014-08-22 DIAGNOSIS — L989 Disorder of the skin and subcutaneous tissue, unspecified: Secondary | ICD-10-CM

## 2014-08-22 DIAGNOSIS — G894 Chronic pain syndrome: Secondary | ICD-10-CM

## 2014-08-22 NOTE — Progress Notes (Signed)
PCP: Terald Sleeper, MD  Code Status: Full   Allergies  Allergen Reactions  . Ibuprofen   . Levaquin [Levofloxacin In D5w]   . Penicillins   . Sulfa Antibiotics     Chief Complaint: Medical management of chronic issues  HPI:  53 year old female with a very complex medical history who is being seen today for routine follow up. She has extensive medical history includes HTN, morbid obesity, COPD, fibromyalgia and chronic pain, diabetes type 2, hypothyroidism, anemia. Staff reports no acute distress with patient, however has chronic complaints of UTI, yeast, and scalp lesions as well as pain. This has been an on-going issues. She has been seem multiple times this year for UTI and per patient report nothing is working to cure it. She requests to placed on IV Rocephin, chronic Nystatin tablets, and wells an antibiotic for her scalp lesions. The patient has continued complaints of bladder pain as well as neuropathy. Per staff report she is not participating in rehab activities and predominately stays bed bound. Pt denies this and states she does participate in all activities. Her primary focus stems around being treated for a UTI. According to Dr. Leanord Hawking last note, this is chronic issue in which the patient dwells on. 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 Once again she does not want to hear about this. She has not had any fever, chills, or malaise. She denies chest pain or shortness of breath.     Review of Systems:  Constitutional: Negative for fever, chills, weight loss, malaise/fatigue and diaphoresis.  HENT: Negative for congestion, hearing loss and sore throat.   Eyes: Negative for eye pain, blurred vision, double vision and discharge.  Respiratory: Negative for cough, sputum production, shortness of breath and wheezing.   Cardiovascular: Negative for chest pain, palpitations, orthopnea and leg swelling.  Gastrointestinal:  Negative for heartburn, nausea, vomiting, abdominal pain, diarrhea and constipation.  Genitourinary: Endorses bladder pain and flank pain.  Negative for dysuria, urgency, frequency, hematuria Musculoskeletal:Endorses chronic pain, neuropathy, and myalgias Skin:Endorses itching, scalp lesions, and yeast to skin folds.   Neurological: Negative for weakness,dizziness, tingling, focal weakness and headaches.  Psychiatric/Behavioral: Negative for depression and memory loss. The patient is not nervous/anxious.     Past Medical History  Diagnosis Date  . Diabetes mellitus without complication   . Chronic pain   . Morbid obesity   . OSA (obstructive sleep apnea)   . Generalized weakness   . Fibromyalgia   . Lyme disease   . Anemia   . History of blood transfusion     pt states she has had 5 blood transfusions  . Sleep disorder   . UTI (lower urinary tract infection) 02/22/2014  . Hypertension   . Hypothyroidism   . COPD (chronic obstructive pulmonary disease)   . Shortness of breath   . GERD (gastroesophageal reflux disease)    Past Surgical History  Procedure Laterality Date  . Explaratory      on stomach,   . Appendectomy    . Foot surgery      due to stepping on broken glass  . Spinal tap    . Supra pubic catheter    . Esophagogastroduodenoscopy (egd) with propofol N/A 03/05/2014    Procedure: ESOPHAGOGASTRODUODENOSCOPY (EGD) WITH PROPOFOL;  Surgeon: Willis Modena, MD;  Location: [facility];  Service: Endoscopy;  Laterality: N/A;  . Colonoscopy with propofol N/A 03/05/2014    Procedure: COLONOSCOPY WITH PROPOFOL;  Surgeon:  Willis Modena, MD;  Location: [facility];  Service: Endoscopy;  Laterality: N/A;  . Dental restoration/extraction with x-ray    . Colonoscopy with propofol N/A 03/06/2014    Procedure: COLONOSCOPY WITH PROPOFOL;  Surgeon: Willis Modena, MD;  Location: [facility];  Service: Endoscopy;  Laterality: N/A;   Social History:   reports that she quit smoking  about 6 years ago. She does not have any smokeless tobacco history on file. She reports that she does not drink alcohol or use illicit drugs.  History reviewed. No pertinent family history.  Medications:   Medication List       This list is accurate as of: 08/22/14  2:48 PM.  Always use your most recent med list.               acetaminophen 325 MG tablet  Commonly known as:  TYLENOL  Take 650 mg by mouth every 4 (four) hours as needed for mild pain.     benzocaine 10 % mucosal gel  Commonly known as:  ORAJEL  Use as directed 1 application in the mouth or throat 3 (three) times daily as needed for mouth pain.     bisacodyl 5 MG EC tablet  Commonly known as:  DULCOLAX  Take 2 tablets (10 mg total) by mouth daily.     buPROPion 300 MG 24 hr tablet  Commonly known as:  WELLBUTRIN XL  Take 300 mg by mouth daily.     cloNIDine 0.1 MG tablet  Commonly known as:  CATAPRES  Take 0.1 mg by mouth as needed.     diazepam 2 MG tablet  Commonly known as:  VALIUM  Take 1 tablet (2 mg total) by mouth 2 (two) times daily as needed for anxiety (and personal care).     diazepam 10 MG tablet  Commonly known as:  VALIUM  Take one tablet by mouth every night at bedtime as needed for anxiety     diazepam 2 MG tablet  Commonly known as:  VALIUM  Take one tablet by mouth twice daily as needed for anxiety and personal care     DSS 100 MG Caps  Take 200 mg by mouth 2 (two) times daily.     fluticasone 50 MCG/ACT nasal spray  Commonly known as:  FLONASE  Place 1 spray into both nostrils daily.     FRESHKOTE 2.7-2 % Soln  Generic drug:  Polyvinyl Alcohol-Povidone  Place 1 drop into both eyes 2 (two) times daily.     gabapentin 300 MG capsule  Commonly known as:  NEURONTIN  Take 300 mg by mouth 2 (two) times daily.     guaifenesin 100 MG/5ML syrup  Commonly known as:  ROBITUSSIN  Take 200 mg by mouth every 4 (four) hours as needed for cough.     hydrochlorothiazide 25 MG tablet    Commonly known as:  HYDRODIURIL  Take 25 mg by mouth daily.     hydrOXYzine 25 MG tablet  Commonly known as:  ATARAX/VISTARIL  Take 25 mg by mouth every 8 (eight) hours as needed for itching.     insulin lispro 100 UNIT/ML injection  Commonly known as:  HUMALOG  Inject 0-8 Units into the skin See admin instructions. Sliding Scale - Inject before each meal and at bed time     insulin NPH-regular Human (70-30) 100 UNIT/ML injection  Commonly known as:  NOVOLIN 70/30  Inject 25 Units into the skin 2 (two) times daily with a meal.  ipratropium-albuterol 0.5-2.5 (3) MG/3ML Soln  Commonly known as:  DUONEB  Take 3 mLs by nebulization every 4 (four) hours as needed (for wheezing).     levothyroxine 300 MCG tablet  Commonly known as:  SYNTHROID, LEVOTHROID  Take 300 mcg by mouth daily before breakfast.     loperamide 1 MG/5ML solution  Commonly known as:  IMODIUM  Take 4 mg by mouth as needed for diarrhea or loose stools.     loratadine 10 MG tablet  Commonly known as:  CLARITIN  Take 10 mg by mouth daily.     lubiprostone 24 MCG capsule  Commonly known as:  AMITIZA  Take 24 mcg by mouth 2 (two) times daily with a meal.     methadone 5 MG tablet  Commonly known as:  DOLOPHINE  Take three tablets by mouth daily at 8am and 11pm; Take two and 1/2 tablet by mouth once daily at 2pm     methadone 10 MG tablet  Commonly known as:  DOLOPHINE  Take 3 tablets= 30 mg by mouth three times a day for pain.     nitroGLYCERIN 0.4 MG SL tablet  Commonly known as:  NITROSTAT  Place 0.4 mg under the tongue every 5 (five) minutes as needed for chest pain.     omeprazole 20 MG capsule  Commonly known as:  PRILOSEC  Take 20 mg by mouth daily.     oxybutynin 5 MG tablet  Commonly known as:  DITROPAN  Take 5 mg by mouth 3 (three) times daily.     oxyCODONE 5 MG immediate release tablet  Commonly known as:  ROXICODONE  Take two tablets by mouth every 8 hours as needed for severe pain;  Take one tablet by mouth every four hours as needed for pain     Oxycodone HCl 10 MG Tabs  Take one tablet by mouth every 4 hours as needed for pain     phenazopyridine 200 MG tablet  Commonly known as:  PYRIDIUM  Take 200 mg by mouth 3 (three) times daily as needed for pain.     phenol-menthol 14.5 MG lozenge  Place 1 lozenge inside cheek as needed for sore throat.     polyethylene glycol packet  Commonly known as:  MIRALAX / GLYCOLAX  Take 17 g by mouth 2 (two) times daily.     potassium chloride 10 MEQ CR capsule  Commonly known as:  MICRO-K  Take 10 mEq by mouth daily.     sodium phosphate enema  Commonly known as:  FLEET  Place 1 enema rectally daily as needed. follow package directions     sucralfate 1 G tablet  Commonly known as:  CARAFATE  Take 1 g by mouth 4 (four) times daily -  with meals and at bedtime.     SYSTANE 0.4-0.3 % Soln  Generic drug:  Polyethyl Glycol-Propyl Glycol  Place 2 drops into both eyes every 6 (six) hours as needed.     tizanidine 2 MG capsule  Commonly known as:  ZANAFLEX  Take 2 mg by mouth daily.     tiZANidine 4 MG capsule  Commonly known as:  ZANAFLEX  Take 4 mg by mouth every evening.     traZODone 100 MG tablet  Commonly known as:  DESYREL  Take 100 mg by mouth at bedtime.     venlafaxine XR 150 MG 24 hr capsule  Commonly known as:  EFFEXOR-XR  Take 150 mg by mouth daily with breakfast.  Vitamin D (Ergocalciferol) 50000 UNITS Caps capsule  Commonly known as:  DRISDOL  Take 50,000 Units by mouth every 14 (fourteen) days.          Physical Exam:  Filed Vitals:   08/22/14 1434  BP: 100/72  Pulse: 74  Temp: 98.3 F (36.8 C)  Resp: 20    General- Morbidly obese Caucasian female in no acute distress, sitting up in bed eating lunch Head- atraumatic, normocephalic Eyes- PERRLA, EOMI, no pallor, no icterus, no discharge Neck- no lymphadenopathy, no thyromegaly, no jugular vein distension, no carotid bruit Ears-  left ear normal tympanic membrane and normal external ear canal , right ear normal tympanic membrane and normal external ear canal Throat- moist mucus membrane, normal oropharynx Nose- normal nasal mucosa, no maxillary or frontal sinus tenderness Chest- no chest wall deformities, no chest wall tenderness Cardiovascular- normal s1,s2, no murmurs/ rubs/ gallops Respiratory- bilateral clear to auscultation, no wheeze, no rhonchi, no crackles, no use of accessory muscles Abdomen- bowel sounds present, soft/obese, non tender, no organomegaly, no abdominal bruits, no guarding or rigidity, no CVA tenderness Pelvic exam- normal pelvic exam Musculoskeletal- able to move all 4 extremities, decreased range of motion, greater to lower extremities, no leg edema Neurological- no focal deficit, decreased muscle strength Skin- warm and dry, scabbed sores to mid hairline; dry scalp/flaky skin noted Psychiatry- alert and oriented to person, place and time, normal mood and affect    Labs reviewed: Basic Metabolic Panel:  Recent Labs  60/45/40 0530 03/03/14 0548 03/07/14 0440  NA 142 141 138  K 3.9 3.9 4.2  CL 99 99 94*  CO2 33* 34* 25  GLUCOSE 157* 199* 272*  BUN CREATININE 0.79 0.76 0.96  CALCIUM 8.8 8.8 9.0   Liver Function Tests:  Recent Labs  02/22/14 1350 02/23/14 0138 02/24/14 0530  AST 61* 36 20  ALT 62* 51* 37*  ALKPHOS 146* 125* 97  BILITOT 0.5 0.4 0.2*  PROT 7.8 6.7 6.4  ALBUMIN 3.3* 3.0* 2.7*    Recent Labs  02/22/14 1350  LIPASE 13    Recent Labs  02/22/14 1356  AMMONIA 32   CBC:  Recent Labs  02/22/14 1350  03/04/14 0454 03/05/14 0440 03/06/14 0542 03/07/14 0440  WBC 8.6  < > 7.4 4.8 5.7  --   NEUTROABS 6.3  --   --   --   --   --   HGB 7.6*  < > 6.4* 6.0* 6.5* 7.4*  HCT 27.6*  < > 23.9* 22.0* 24.8* 27.5*  MCV 75.8*  < > 76.8* 75.9* 77.7*  --   PLT 281  < > 230 206 220  --   < > = values in this interval not displayed. Cardiac  Enzymes:  Recent Labs  02/22/14 1350  TROPONINI <0.30   BNP: No components found with this basename: POCBNP,  CBG:  Recent Labs  03/10/14 2145 03/11/14 0732 05/17/14 1013  GLUCAP 137* 102* 124*     Assessment/Plan 1. Unspecified constipation  Chronic, on-going issue. This is most likely due to narcotic dependence and poor physical activity Continue colace  PO BID, Miralax 17grams PO BID, Dulcolax  PO daily;  Amitiza PO BID Pt requests injectable shot for constipation, does not recall name 2. Type II or unspecified type diabetes mellitus with peripheral circulatory disorders, uncontrolled(250.72)  Sugars up and down 500s to 120s, patient in non-compliant with diet Currently on SSI, will continue current insulin regmin 3. HTN (hypertension)  -  Patient is stable Continue Oretic/HCTZ  daily 4. Unspecified hereditary and idiopathic peripheral neuropathy  Complains of chronic neuropathy Continue Neurontin  PO BID 5. Chronic pain syndrome  Stable; continue Methadone  TID 6. Morbid obesity  -conts to eat excessively; pt very non-compliant with diet 7. Depression  -with anxiety disorder and panic attacks Continue current medications recommended by psych; Wellbutrin  daily and Effexor  daily 8. Unspecified hypothyroidism  Stable Continue Synthroid daily Recheck TSH next blood draw 9. COPD (chronic obstructive pulmonary disease)  Stable; no changes at this time 10. Anemia, iron deficiency Hgb in 02/2014 7.4; pt still refuses iron supplementation Palliative care did meet with patient 11. Scalp lesion  Wants IV antibiotic, chronic issue Has seem dermatology; not happy with their recommendations Refuses anymore topical treatment 12. Dysuria  Chronic complaints Most likely colonization from chronic catheter use Pt is asymptomatic Will not culture or treat at this time, explained this to patient, not happy with plan   45 mins in  total time time greater than 50% of total time spent doing pt counseled and coordination of care regarding multiple medical diseases     Arturo Morton, Jacques Earthly, Student-NP     Synthia Innocent NP [facility]  Contact [phone] Monday through Friday 8am- 5pm  After hours call [phone]

## 2014-09-01 DIAGNOSIS — I10 Essential (primary) hypertension: Secondary | ICD-10-CM

## 2014-09-01 HISTORY — DX: Essential (primary) hypertension: I10

## 2014-09-03 ENCOUNTER — Other Ambulatory Visit: Payer: Self-pay | Admitting: *Deleted

## 2014-09-03 MED ORDER — OXYCODONE HCL 10 MG PO TABS: ORAL_TABLET | ORAL | Status: DC

## 2014-09-03 MED ORDER — METHADONE HCL 10 MG PO TABS: ORAL_TABLET | ORAL | Status: DC

## 2014-09-03 NOTE — Telephone Encounter (Signed)
[facility] 

## 2014-09-11 NOTE — Addendum Note (Signed)
Addended by: Sharee Holster on: 09/11/2014 09:20 PM   Modules accepted: Level of Service, SmartSet

## 2014-09-11 NOTE — Progress Notes (Signed)
Patient ID: [name], female   DOB: 04/25/1961, 53 y.o.   MRN: [id number]     [facility]  PCP: Terald Sleeper, MD  Code Status: Full     Allergies   Allergen  Reactions   .  Ibuprofen     .  Levaquin [Levofloxacin In D5w]     .  Penicillins     .  Sulfa Antibiotics       Chief Complaint: Medical management of chronic issues  HPI:   53 year old female with a very complex medical history who is being seen today for routine follow up. She has extensive medical history includes HTN, morbid obesity, COPD, fibromyalgia and chronic pain, diabetes type 2, hypothyroidism, anemia. Staff reports no acute distress with patient, however has chronic complaints of UTI, yeast, and scalp lesions as well as pain. This has been an on-going issues. She has been seem multiple times this year for UTI and per patient report nothing is working to cure it. She requests to placed on IV Rocephin, chronic Nystatin tablets, and wells an antibiotic for her scalp lesions. The patient has continued complaints of bladder pain as well as neuropathy. Per staff report she is not participating in rehab activities and predominately stays bed bound. Pt denies this and states she does participate in all activities. Her primary focus stems around being treated for a UTI. According to Dr. Leanord Hawking last note, this is chronic issue in which the patient dwells on. 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 Once again she does not want to hear about this. She has not had any fever, chills, or malaise. She denies chest pain or shortness of breath.      Review of Systems:  Constitutional: Negative for fever, chills, weight loss, malaise/fatigue and diaphoresis.  HENT: Negative for congestion, hearing loss and sore throat.   Eyes: Negative for eye pain, blurred vision, double vision and discharge.  Respiratory: Negative for cough, sputum production, shortness of breath and  wheezing.   Cardiovascular: Negative for chest pain, palpitations, orthopnea and leg swelling.  Gastrointestinal: Negative for heartburn, nausea, vomiting, abdominal pain, diarrhea and constipation.  Genitourinary: Endorses bladder pain and flank pain.  Negative for dysuria, urgency, frequency, hematuria Musculoskeletal:Endorses chronic pain, neuropathy, and myalgias Skin:Endorses itching, scalp lesions, and yeast to skin folds.    Neurological: Negative for weakness,dizziness, tingling, focal weakness and headaches.  Psychiatric/Behavioral: Negative for depression and memory loss. The patient is not nervous/anxious.       Past Medical History   Diagnosis  Date   .  Diabetes mellitus without complication     .  Chronic pain     .  Morbid obesity     .  OSA (obstructive sleep apnea)     .  Generalized weakness     .  Fibromyalgia     .  Lyme disease     .  Anemia     .  History of blood transfusion         pt states she has had 5 blood transfusions   .  Sleep disorder     .  UTI (lower urinary tract infection)  02/22/2014   .  Hypertension     .  Hypothyroidism     .  COPD (chronic obstructive pulmonary disease)     .  Shortness of breath     .  GERD (gastroesophageal reflux disease)        Past Surgical History  Procedure  Laterality  Date   .  Explaratory           on stomach,    .  Appendectomy       .  Foot surgery           due to stepping on broken glass   .  Spinal tap       .  Supra pubic catheter       .  Esophagogastroduodenoscopy (egd) with propofol  N/A  03/05/2014       Procedure: ESOPHAGOGASTRODUODENOSCOPY (EGD) WITH PROPOFOL;  Surgeon: Willis Modena, MD;  Location: [facility];  Service: Endoscopy;  Laterality: N/A;   .  Colonoscopy with propofol  N/A  03/05/2014       Procedure: COLONOSCOPY WITH PROPOFOL;  Surgeon: Willis Modena, MD;  Location: [facility];  Service: Endoscopy;  Laterality: N/A;   .  Dental restoration/extraction with x-ray       .   Colonoscopy with propofol  N/A  03/06/2014       Procedure: COLONOSCOPY WITH PROPOFOL;  Surgeon: Willis Modena, MD;  Location: [facility];  Service: Endoscopy;  Laterality: N/A;    Social History:   reports that she quit smoking about 6 years ago. She does not have any smokeless tobacco history on file. She reports that she does not drink alcohol or use illicit drugs.  History reviewed. No pertinent family history.  Medications:    Medication List           This list is accurate as of: 08/22/14  2:48 PM.  Always use your most recent med list.                       acetaminophen 325 MG tablet   Commonly known as:  TYLENOL   Take 650 mg by mouth every 4 (four) hours as needed for mild pain.        benzocaine 10 % mucosal gel   Commonly known as:  ORAJEL   Use as directed 1 application in the mouth or throat 3 (three) times daily as needed for mouth pain.        bisacodyl 5 MG EC tablet   Commonly known as:  DULCOLAX   Take 2 tablets (10 mg total) by mouth daily.        buPROPion 300 MG 24 hr tablet   Commonly known as:  WELLBUTRIN XL   Take 300 mg by mouth daily.        cloNIDine 0.1 MG tablet   Commonly known as:  CATAPRES   Take 0.1 mg by mouth as needed.        diazepam 2 MG tablet   Commonly known as:  VALIUM   Take 1 tablet (2 mg total) by mouth 2 (two) times daily as needed for anxiety (and personal care).        diazepam 10 MG tablet   Commonly known as:  VALIUM   Take one tablet by mouth every night at bedtime as needed for anxiety        diazepam 2 MG tablet   Commonly known as:  VALIUM   Take one tablet by mouth twice daily as needed for anxiety and personal care        DSS 100 MG Caps   Take 200 mg by mouth 2 (two) times daily.        fluticasone 50 MCG/ACT nasal spray   Commonly known as:  FLONASE  Place 1 spray into both nostrils daily.        FRESHKOTE 2.7-2 % Soln   Generic drug:  Polyvinyl Alcohol-Povidone   Place 1 drop into both eyes 2  (two) times daily.        gabapentin 300 MG capsule   Commonly known as:  NEURONTIN   Take 300 mg by mouth 2 (two) times daily.        guaifenesin 100 MG/5ML syrup   Commonly known as:  ROBITUSSIN   Take 200 mg by mouth every 4 (four) hours as needed for cough.        hydrochlorothiazide 25 MG tablet   Commonly known as:  HYDRODIURIL   Take 25 mg by mouth daily.        hydrOXYzine 25 MG tablet   Commonly known as:  ATARAX/VISTARIL   Take 25 mg by mouth every 8 (eight) hours as needed for itching.        insulin lispro 100 UNIT/ML injection   Commonly known as:  HUMALOG   Inject 0-8 Units into the skin See admin instructions. Sliding Scale - Inject before each meal and at bed time        insulin NPH-regular Human (70-30) 100 UNIT/ML injection   Commonly known as:  NOVOLIN 70/30   Inject 25 Units into the skin 2 (two) times daily with a meal.        ipratropium-albuterol 0.5-2.5 (3) MG/3ML Soln   Commonly known as:  DUONEB   Take 3 mLs by nebulization every 4 (four) hours as needed (for wheezing).        levothyroxine 300 MCG tablet   Commonly known as:  SYNTHROID, LEVOTHROID   Take 300 mcg by mouth daily before breakfast.        loperamide 1 MG/5ML solution   Commonly known as:  IMODIUM   Take 4 mg by mouth as needed for diarrhea or loose stools.        loratadine 10 MG tablet   Commonly known as:  CLARITIN   Take 10 mg by mouth daily.        lubiprostone 24 MCG capsule   Commonly known as:  AMITIZA   Take 24 mcg by mouth 2 (two) times daily with a meal.        methadone 5 MG tablet   Commonly known as:  DOLOPHINE   Take three tablets by mouth daily at 8am and 11pm; Take two and 1/2 tablet by mouth once daily at 2pm        methadone 10 MG tablet   Commonly known as:  DOLOPHINE   Take 3 tablets= 30 mg by mouth three times a day for pain.        nitroGLYCERIN 0.4 MG SL tablet   Commonly known as:  NITROSTAT   Place 0.4 mg under the tongue every 5 (five) minutes as  needed for chest pain.        omeprazole 20 MG capsule   Commonly known as:  PRILOSEC   Take 20 mg by mouth daily.        oxybutynin 5 MG tablet   Commonly known as:  DITROPAN   Take 5 mg by mouth 3 (three) times daily.        oxyCODONE 5 MG immediate release tablet   Commonly known as:  ROXICODONE   Take two tablets by mouth every 8 hours as needed for severe pain; Take one tablet by mouth every four hours as  needed for pain        Oxycodone HCl 10 MG Tabs   Take one tablet by mouth every 4 hours as needed for pain        phenazopyridine 200 MG tablet   Commonly known as:  PYRIDIUM   Take 200 mg by mouth 3 (three) times daily as needed for pain.        phenol-menthol 14.5 MG lozenge   Place 1 lozenge inside cheek as needed for sore throat.        polyethylene glycol packet   Commonly known as:  MIRALAX / GLYCOLAX   Take 17 g by mouth 2 (two) times daily.        potassium chloride 10 MEQ CR capsule   Commonly known as:  MICRO-K   Take 10 mEq by mouth daily.        sodium phosphate enema   Commonly known as:  FLEET   Place 1 enema rectally daily as needed. follow package directions        sucralfate 1 G tablet   Commonly known as:  CARAFATE   Take 1 g by mouth 4 (four) times daily -  with meals and at bedtime.        SYSTANE 0.4-0.3 % Soln   Generic drug:  Polyethyl Glycol-Propyl Glycol   Place 2 drops into both eyes every 6 (six) hours as needed.        tizanidine 2 MG capsule   Commonly known as:  ZANAFLEX   Take 2 mg by mouth daily.        tiZANidine 4 MG capsule   Commonly known as:  ZANAFLEX   Take 4 mg by mouth every evening.        traZODone 100 MG tablet   Commonly known as:  DESYREL   Take 100 mg by mouth at bedtime.        venlafaxine XR 150 MG 24 hr capsule   Commonly known as:  EFFEXOR-XR   Take 150 mg by mouth daily with breakfast.        Vitamin D (Ergocalciferol) 50000 UNITS Caps capsule   Commonly known as:  DRISDOL   Take 50,000 Units by  mouth every 14 (fourteen) days.             Physical Exam:    Filed Vitals:     08/22/14 1434   BP:  100/72   Pulse:  74   Temp:  98.3 F (36.8 C)   Resp:  20     General- Morbidly obese Caucasian female in no acute distress, sitting up in bed eating lunch Head- atraumatic, normocephalic Eyes- PERRLA, EOMI, no pallor, no icterus, no discharge Neck- no lymphadenopathy, no thyromegaly, no jugular vein distension, no carotid bruit Ears- left ear normal tympanic membrane and normal external ear canal , right ear normal tympanic membrane and normal external ear canal Throat- moist mucus membrane, normal oropharynx Nose- normal nasal mucosa, no maxillary or frontal sinus tenderness Chest- no chest wall deformities, no chest wall tenderness Cardiovascular- normal s1,s2, no murmurs/ rubs/ gallops Respiratory- bilateral clear to auscultation, no wheeze, no rhonchi, no crackles, no use of accessory muscles Abdomen- bowel sounds present, soft/obese, non tender, no organomegaly, no abdominal bruits, no guarding or rigidity, no CVA tenderness Pelvic exam- normal pelvic exam Musculoskeletal- able to move all 4 extremities, decreased range of motion, greater to lower extremities, no leg edema Neurological- no focal deficit, decreased muscle strength Skin- warm and dry, scabbed  sores to mid hairline; dry scalp/flaky skin noted Psychiatry- alert and oriented to person, place and time, normal mood and affect    Labs reviewed: Basic Metabolic Panel:   Recent Labs   02/24/14 0530  03/03/14 0548  03/07/14 0440   NA  142  141  138   K  3.9  3.9  4.2   CL  99  99  94*   CO2  33*  34*  25   GLUCOSE  157*  199*  272*   BUN  CREATININE  0.79  0.76  0.96   CALCIUM  8.8  8.8  9.0    Liver Function Tests:   Recent Labs   02/22/14 1350  02/23/14 0138  02/24/14 0530   AST  61*  36  20   ALT  62*  51*  37*   ALKPHOS  146*  125*  97   BILITOT  0.5  0.4  0.2*   PROT  7.8  6.7   6.4   ALBUMIN  3.3*  3.0*  2.7*      Recent Labs   02/22/14 1350   LIPASE  13      Recent Labs   02/22/14 1356   AMMONIA  32    CBC:   Recent Labs   02/22/14 1350    03/04/14 0454  03/05/14 0440  03/06/14 0542  03/07/14 0440   WBC  8.6   < >  7.4  4.8  5.7   --    NEUTROABS  6.3   --    --    --    --    --    HGB  7.6*   < >  6.4*  6.0*  6.5*  7.4*   HCT  27.6*   < >  23.9*  22.0*  24.8*  27.5*   MCV  75.8*   < >  76.8*  75.9*  77.7*   --    PLT  281   < >  230  206  220   --    < > = values in this interval not displayed. Cardiac Enzymes:   Recent Labs   02/22/14 1350   TROPONINI  <0.30    BNP: No components found with this basename: POCBNP,  CBG:   Recent Labs   03/10/14 2145  03/11/14 0732  05/17/14 1013   GLUCAP  137*  102*  124*      Assessment/Plan 1. Unspecified constipation  Chronic, on-going issue. This is most likely due to narcotic dependence and poor physical activity Continue colace  PO BID, Miralax 17grams PO BID, Dulcolax  PO daily;  Amitiza PO BID Pt requests injectable shot for constipation, does not recall name 2. Type II or unspecified type diabetes mellitus with peripheral circulatory disorders, uncontrolled(250.72)  Sugars up and down 500s to 120s, patient in non-compliant with diet Currently on SSI, will continue current insulin regmin 3. HTN (hypertension)  -Patient is stable Continue Oretic/HCTZ  daily 4. Unspecified hereditary and idiopathic peripheral neuropathy   Complains of chronic neuropathy Continue Neurontin  PO BID 5. Chronic pain syndrome  Stable; continue Methadone  TID 6. Morbid obesity  -conts to eat excessively; pt very non-compliant with diet 7. Depression   -with anxiety disorder and panic attacks Continue current medications recommended by psych; Wellbutrin  daily and Effexor  daily 8. Unspecified hypothyroidism  Stable Continue Synthroid daily Recheck TSH  next blood draw 9. COPD (chronic obstructive pulmonary disease)   Stable; no changes at this time 10. Anemia, iron deficiency Hgb in 02/2014 7.4; pt still refuses iron supplementation Palliative care did meet with patient 11. Scalp lesion   Wants IV antibiotic, chronic issue Has seem dermatology; not happy with their recommendations Refuses anymore topical treatment 12. Dysuria   Chronic complaints Most likely colonization from chronic catheter use Pt is asymptomatic Will not culture or treat at this time, explained this to patient, not happy with plan   45 mins in total time time greater than 50% of total time spent doing pt counseled and coordination of care regarding multiple medical diseases      Arturo Morton, Jacques Earthly, Student-NP     Synthia Innocent NP [facility]   Contact [phone] Monday through Friday 8am- 5pm   After hours call [phone]

## 2014-09-11 NOTE — Progress Notes (Signed)
This encounter was created in error - please disregard.

## 2014-09-24 ENCOUNTER — Non-Acute Institutional Stay (SKILLED_NURSING_FACILITY): Payer: Medicare Other | Admitting: Internal Medicine

## 2014-09-24 DIAGNOSIS — IMO0002 Reserved for concepts with insufficient information to code with codable children: Secondary | ICD-10-CM

## 2014-09-24 DIAGNOSIS — D509 Iron deficiency anemia, unspecified: Secondary | ICD-10-CM

## 2014-09-24 DIAGNOSIS — E1165 Type 2 diabetes mellitus with hyperglycemia: Secondary | ICD-10-CM

## 2014-09-24 DIAGNOSIS — E118 Type 2 diabetes mellitus with unspecified complications: Secondary | ICD-10-CM

## 2014-09-24 DIAGNOSIS — J449 Chronic obstructive pulmonary disease, unspecified: Secondary | ICD-10-CM

## 2014-09-26 NOTE — Progress Notes (Signed)
Patient ID: [name], female   DOB: 12/03/1961, 53 y.o.   MRN: [id number]              PROGRESS NOTE  DATE:  09/24/2014    FAC[facility]: [facility]    LEVEL OF CARE:   SNF   Routine Visit   CHIEF COMPLAINT:  Routine visit to follow medical issues and multiple concerns from the patient.     HISTORY OF PRESENT ILLNESS:  [name] is a 53 year-old woman who came to [place] from a nursing home in the [place] area.  She did not come with any useful medical information.  As usual she has multiple medical concerns and complaints that Don't have clear etiologies.   Her major issues that she wants to discuss with me today include:    Feeling that she has chronic fungus infections in her mouth, bladder and lungs.  She requests chronic antifungal oral that she has been on in the past.    Wheezing.  She states that she has had asthma and COPD and has been on chronic oxygen for years.  She states she had a chest x-ray earlier this month, which I do not see.  Nevertheless, she feels that she is wheezing more.  Again the exact diagnosis here is unclear  Chronic interstitial cystitis with a chronic Foley catheter.  She feels she has chronic yeast infections.  The reason for the chronic Foley catheter exactly is not clear.  The patient states this was put in due to urinary incontinence but, beyond that, I cannot really obtain any more information.  She has been unwilling in the past to have a voiding trial.    Complaints of generalized pain and stiffness.  Felt to be a combination of generalized osteoarthritis and fibromyalgia.    Chronic constipation.  The patient states she has not had a bowel movement in 5-6 days.    Chronic iron deficiency anemia.  The patient claims intolerance to all forms of iron.  We last checked her hemoglobin at 8.7 on 09/04/2014.  She clearly has iron deficiency anemia.  She had a poorly prepped colonoscopy in March that was not that revealing (stool beyond the splenic  flexure).   She also had an endoscopy, although I cannot get this result today.     Depression; followed by psychiatry  CURRENT MEDICATIONS:  Medication list is reviewed.     Dulcolax 10 mg by mouth daily.    Hydrochlorothiazide 25 q.d.    Claritin 10 q.d.    Prilosec 20 q.d.    Micro-K 10 q.d.    Zanaflex 1 tablet by mouth daily.     Vitamin D2, 50,000 U every 14 days.     Wellbutrin XL 300 daily.    Synthroid 250 daily, with the TSH last checked on 09/04/2014 at 2.05.    Effexor 75 q.d.    Colace 100 b.i.d.    Neurontin 300 twice daily.    Amitiza 24 b.i.d.    MiraLAX 17 g daily.    Ditropan 5 mg twice daily for bladder spasms.    Carafate 1 g four times a day.    Methadone 30 mg three times a day.    Flonase 50 mcg, alternating nostrils.    Zanaflex 4 mg every evening.    Trazodone 100 q.h.s.     Insulin 70/30, 25 U twice daily.  CBGs generally appear to be fairly stable in the mid 100s to low 200s.  She has not had a  recent hemoglobin A1c that I can see.    REVIEW OF SYSTEMS:   GENERAL:  Per the nursing staff, there is not much different in this woman.   CHEST/RESPIRATORY:  States she is coughing and wheezing.   CARDIAC:   No clear chest pain.    GI:  States she has severe constipation.   GU:  Again, a litany of complaints in this area including pressure, pain.  She is convinced she has a fungal UTI and that it has spread to her lungs.    PHYSICAL EXAMINATION:    VITAL SIGNS:   O2 SATURATIONS:  95% on 2 L.   PULSE:  90.   RESPIRATIONS:  18 and unlabored.  There is no obvious accessory muscle use.   GENERAL APPEARANCE:  The patient is not in any distress.  Upset about her usual multitude of issues.   CHEST/RESPIRATORY:  Shallow air entry bilaterally.  There is an end-expiratory wheeze.   CARDIOVASCULAR:  CARDIAC:   Heart sounds are normal.  She appears to be euvolemic.   GASTROINTESTINAL:  ABDOMEN:   Very distended.  Bowel sounds are present, but  reduced.   GENITOURINARY:  BLADDER:   No overt bladder fullness, tenderness, or costovertebral angle tenderness.   CIRCULATION:   EDEMA/VARICOSITIES:  Extremities:  No edema.    ASSESSMENT/PLAN:  Once again, it is difficult to really determine in this patient what might be significant versus not.  I suspect she does have some form of obstructive lung disease. I don't believe she has chromic fungemia. She definitely has iron def, the etiology of which is unclear. She will not accept any form of Iron.    COPD, maybe asthma.  She does appear to have shallow air entry and wheezing.  She requests Combivent at the bedside.  I would rather give this to her routinely so we can monitor this.  She has been on longstanding oxygen. ?PFT's . Possible sleep apnea and or OHS.  Chronic iron deficiency anemia.  The cause of this was never really determined.  The patient states intolerance to all forms of iron, although she will accept transfusions albeit somewhat reluctantly.    Chronic Foley catheter with apparently a history of interstitial cystitis.  The reason for her catheter was incontinence.  She will not accept a voiding trial.  Might consider sending her to Urology.  She is chronically colonized with organisms, although not yeast.    Chronic constipation.  I am going to change her to Dulcolax suppositories from tablets.    Type 2 diabetes.  On insulin.   I will check a hemoglobin A1c on her, along with lab work, in 2-3 weeks.    Of the issues, I think discussion of PFT's is worth pursing. She will not accept a voiding trial even though she constantly complains she has a UTI. Cultures of her urine usually shows multi drug resistant organisms even when she is "asymptomatic".

## 2014-10-02 ENCOUNTER — Non-Acute Institutional Stay (SKILLED_NURSING_FACILITY): Payer: Medicare Other | Admitting: Internal Medicine

## 2014-10-02 DIAGNOSIS — D509 Iron deficiency anemia, unspecified: Secondary | ICD-10-CM

## 2014-10-02 DIAGNOSIS — K118 Other diseases of salivary glands: Secondary | ICD-10-CM

## 2014-10-02 NOTE — Progress Notes (Signed)
Patient ID: [name], female   DOB: 02/20/1961, 53 y.o.   MRN: [id number] Facility[facility] CC: Facial fullness on the left History: I was asked to look at this patient for a fullness on the left side of her face by the staff. We had treated her for parotitis early in the year. When she was hospitalized In February she had a CT scan of the area showing an enlarged parotid gland and non specific lymph nodes. A follow-up exam was suggested in 8 weeks which I attempted To arrange in May however the patient refused this study.   She is also requesting a blood transfusion suggesting that seems to have helped a skin problem. The patient has known iron deficiency anemia. A workup in the hospital in  February did not show an obvious source of this. She had a poorly prepped colonoscopy. Once again the patient refuses Iron in any form quoting some ill defined reaction. In any case her Hgb is over 8 this month. She does not require a transfusion.  I once again asked about an attempt at oral Iron which she has refused. From the discussion It does appear that an attempt at IV iron in the past had some form of reaction.   ROS HEENT; No oral pain.  Resp: no sob on baseline O2.   Physical Exam HEENT; Oral exam with her dentures removed shows no suspicious lesions.  Neck: She does have a fulless over the angle of the jaw. This seems firm and ?painful Lymph; No cervical nodes on obvious although with the soft tissue this is a difficult exam  Impressions/plan 1) Parotid gland enlargement: I am not completely certain what is being felt is her parotid. Nor does this feel like something that should be that painful. However There is no doubt this needs to be followed with a CT scan which I will arrange once again.  2) Fe def anemia: this is clear. What is not clear is the source. She does not require a transfusion at this point. I have again discussed attempting oral Iron  Which she has refused. I am  not planning to re attempt this discussion. A poorly prepped colonoscopy was negative (did not see the left colon). I don't see that she Had an upper endoscopy or small bowel biopsy. I may try to re refer her to GI at some point for these studies however the patient's cooperation with outside referrals Makes this difficult.        CLINICAL DATA:  Left parotid swelling.  Rule out underlying abscess.   EXAM: CT MAXILLOFACIAL WITH CONTRAST   TECHNIQUE: Multidetector CT imaging of the maxillofacial structures was performed with intravenous contrast. Multiplanar CT image reconstructions were also generated. A small metallic BB was placed on the right temple in order to reliably differentiate right from left.   CONTRAST:  OMNIPAQUE IOHEXOL 300 MG/ML  SOLN   COMPARISON:  None.   FINDINGS: Orbits are normal and symmetric. Paranasal sinuses well developed and well aerated without significant opacification or air-fluid levels. Mastoid air cells are clear. External auditory canals are normal and symmetric. There are several lymph nodes over the left parotid gland with the largest anterior to the external auditory canal measuring 9 mm by short axis. There are a couple right parotid gland lymph nodes over the superficial aspect. There are no significant inflammatory changes present. There is no underlying abscess. Remainder of the suprahyoid neck is unremarkable. Visualized infrahyoid neck is unremarkable. Remainder of the  bony and soft tissue structures are within normal   IMPRESSION: Increased number of several mildly prominent lymph nodes over the left parotid gland without significant associated inflammatory change. No evidence of abscess. Findings may be seen in nonspecific parotitis which may be of infectious or autoimmune etiology. Neoplastic disease of the less likely. Recommend followup CT 6-8 weeks.     Electronically Signed   By: Elberta Fortisaniel  Boyle M.D.   On: 02/23/2014  18:23

## 2014-10-03 ENCOUNTER — Other Ambulatory Visit (HOSPITAL_BASED_OUTPATIENT_CLINIC_OR_DEPARTMENT_OTHER): Payer: Self-pay | Admitting: Internal Medicine

## 2014-10-03 DIAGNOSIS — K111 Hypertrophy of salivary gland: Secondary | ICD-10-CM

## 2014-10-04 ENCOUNTER — Other Ambulatory Visit: Payer: Self-pay | Admitting: *Deleted

## 2014-10-04 MED ORDER — METHADONE HCL 10 MG PO TABS: ORAL_TABLET | ORAL | Status: DC

## 2014-10-04 NOTE — Telephone Encounter (Signed)
[facility] 

## 2014-10-08 DIAGNOSIS — J9610 Chronic respiratory failure, unspecified whether with hypoxia or hypercapnia: Secondary | ICD-10-CM

## 2014-10-08 DIAGNOSIS — J441 Chronic obstructive pulmonary disease with (acute) exacerbation: Secondary | ICD-10-CM

## 2014-10-09 ENCOUNTER — Non-Acute Institutional Stay (SKILLED_NURSING_FACILITY): Payer: Medicare Other | Admitting: Internal Medicine

## 2014-10-09 DIAGNOSIS — J441 Chronic obstructive pulmonary disease with (acute) exacerbation: Secondary | ICD-10-CM

## 2014-10-09 DIAGNOSIS — J9610 Chronic respiratory failure, unspecified whether with hypoxia or hypercapnia: Secondary | ICD-10-CM

## 2014-10-11 ENCOUNTER — Ambulatory Visit (HOSPITAL_COMMUNITY)
Admission: RE | Admit: 2014-10-11 | Discharge: 2014-10-11 | Disposition: A | Discharge status: Home / Self Care | Payer: Medicare Other | Source: Ambulatory Visit | Attending: Internal Medicine | Admitting: Internal Medicine

## 2014-10-11 DIAGNOSIS — K111 Hypertrophy of salivary gland: Secondary | ICD-10-CM | POA: Diagnosis present

## 2014-10-11 MED ORDER — IOHEXOL 300 MG/ML  SOLN
100.0000 mL | Freq: Once | INTRAMUSCULAR | Status: AC | PRN
  Administered 2014-10-11: 100 mL via INTRAVENOUS

## 2014-10-15 ENCOUNTER — Non-Acute Institutional Stay (SKILLED_NURSING_FACILITY): Payer: Medicare Other | Admitting: Internal Medicine

## 2014-10-15 DIAGNOSIS — K118 Other diseases of salivary glands: Secondary | ICD-10-CM

## 2014-10-15 DIAGNOSIS — D509 Iron deficiency anemia, unspecified: Secondary | ICD-10-CM

## 2014-10-15 DIAGNOSIS — F32A Depression, unspecified: Secondary | ICD-10-CM

## 2014-10-15 DIAGNOSIS — F329 Major depressive disorder, single episode, unspecified: Secondary | ICD-10-CM

## 2014-10-15 NOTE — Progress Notes (Signed)
Patient ID: [name], female   DOB: 12/25/1961, 53 y.o.   MRN: [id number]               PROGRESS NOTE  DATE:  10/08/2014    FACILITY: [facility]    LEVEL OF CARE:   SNF   Acute Visit   CHIEF COMPLAINT:  Follow up medical issues from my review last week.    HISTORY OF PRESENT ILLNESS:  [name] was felt to have a bibasilar pneumonia.  She was started on doxycycline.  I think this lady may very well have asthma or some form of airway obstruction.  I am predominantly following her because of this.    Also, she is a type 2 diabetic, on Novolin 70/30, 25 U b.i.d.  She was totally noncompliant with any form of diet.  Her blood sugars are high at all times of the day.  Yesterday, her blood sugars were 181, 156, 161, 348.  The pattern repeats itself on most days.  She had a recent hemoglobin A1c, though I do not see this result.    PHYSICAL EXAMINATION:   GENERAL APPEARANCE:  The patient is not in any distress.  Oxygen on.   CHEST/RESPIRATORY:  Poor air entry bilaterally with mild expiratory wheezing.    ASSESSMENT/PLAN:  Pneumonia.  If she did have this last week, she appears to be stable.    Some form of obstructive lung disease.  She is likely having asthma.  I ordered her routine Combivent last week.  She will not take it, wants it p.r.n.  I would like to try her on a long-acting beta agonist/combo, although it is very difficult to get this patient to do anything on a regular basis.    Type 2 diabetes.  Her blood sugars are not under good control.  Hemoglobin A1c is pending.  I am going to increase the Novolin 70/30 to 30 U subcu b.i.d.  My goal here is not to have perfect control in this woman who is morbidly obese and noncompliant.    Chronic suprapubic catheter.  She apparently refuses to have this changed on a regular basis, yet insists she has recurrent UTIs that do not seem to have a firm basis.  I have been reluctant to check her urine for urinalysis and culture  unless I am convinced that she actually has a true infection because, inevitably, she grows very resistant organisms.    Left parotid gland enlargement.  I have taken the opportunity to discuss this with her.  We have organized a follow-up CT scan on Friday, which she already refused several months ago.  We will see how this goes.     CPT CODE: [id number]

## 2014-10-22 ENCOUNTER — Other Ambulatory Visit: Payer: Self-pay | Admitting: *Deleted

## 2014-10-22 MED ORDER — METHADONE HCL 10 MG PO TABS: ORAL_TABLET | ORAL | Status: DC

## 2014-10-22 NOTE — Progress Notes (Addendum)
Patient ID: Karen BarrioChristy Walls, female   DOB: 07/20/1961, 53 y.o.   MRN: [id number]               PROGRESS NOTE  DATE:  10/15/2014    FACILITY: [facility]    LEVEL OF CARE:   SNF   Acute Visit   HISTORY OF PRESENT ILLNESS:  I have been following [name] for some painful fullness over the angle of her jaws, especially on the left.  I thought she had parotitis earlier in the year and followed up with a CT scan, which suggested a follow-up CT scan in three months.  The patient actually did not go to this.  However, I rebooked this and I am looking at the results today.   This showed nonspecific findings involving both parotids with symmetrical bilateral lymph node involvement.  The lymph nodes measure up to 11 mm.  Her submandibular glands were normal.  There were no sinus changes.  No facial cellulitis.  The parotid glands appear symmetric.  It was recommended that the patient see ENT, which I will arrange.    She is also complaining of continued problems with shortness of breath, cough, and wheezing.  A chest x-ray showed atelectasis in the left lower lobe on 09/18/2014.    CURRENT MEDICATIONS:  Medication list is reviewed.      Hydrochlorothiazide 25 q.d.    Claritin 10 q.d.    Prilosec 20 q.d.    Micro-K 10 mEq daily.    Zanaflex 2 mg daily.    Vitamin D2, 50,000 U weekly.    Wellbutrin XL 300, 1 tablet daily for depression.    Synthroid 250 daily.    Effexor 75 q.d.       Colace 200 b.i.d.    Neurontin 300 twice daily.    Amitiza 24 mcg twice daily.    Novolin 70/30, 32 U subcu b.i.d.    REVIEW OF SYSTEMS:    CHEST/RESPIRATORY:  Still states she is coughing up sputum and she feels short of breath.    CARDIAC:   No clear chest pain.   GI:  Admits to constipation, although I am not sure how compliant she is with her bowel regimen.   GU:  She has a chronic Foley catheter.  There are always a multitude of complaints related to this area.    However, cultures have  been done that show multi-drug resistant organisms.  In view of the fact that I am never sure if she is symptomatic, I have not been entertaining this as a true infection.    PHYSICAL EXAMINATION:   HEENT:  There is a fullness at the angle of her jaw.  Whether this is the parotid I am feeling or not, seems bigger than the right side.   LYMPHATICS:  None palpable in the cervical, clavicular or axillary areas.   CHEST/RESPIRATORY:  Shallow air entry bilaterally.  There is expiratory wheezing.    ASSESSMENT/PLAN:  History of asthma in the face of coughing and wheezing.  Some features, including her gastroesophageal reflux disease, could be making this worse.  I did give her Combivent routinely to see if this would help, although she refused this (I think wanted it at the bedside, which I was reluctant to do).  Consider a steroid inhaler.  She also could have COPD, I will consider sending her for PFT's  ?Lymphadenopathy.  This is another one of the multitude of issues that this patient brings up from her days in [place].  She tells me that at some point she was felt to have pathologically enlarged lymph nodes though I am not really sure where, and a PET scan was ordered.  I really cannot feel any peripheral lymph nodes.  At this point, I am reluctant to order expensive tests for unclear reasons.    ENT consult which I will arrange (see above)     .      Fe def anemia: I will monitor her Hgb periodically. She has been pushing for another transfusion which is different from previously when she was refusing transfusions. At this point I don't think this is necessary .      Depression: follow with psychiatry in the facilitty. No doubt depressed but likely Axis 2 diagnosis.

## 2014-10-22 NOTE — Telephone Encounter (Signed)
[facility] 

## 2014-10-24 NOTE — Progress Notes (Signed)
Patient ID: Karen BarrioChristy Plitt, female   DOB: 06/19/1961, 53 y.o.   MRN: [id number]

## 2014-11-19 ENCOUNTER — Other Ambulatory Visit: Payer: Self-pay

## 2014-11-19 MED ORDER — METHADONE HCL 10 MG PO TABS: ORAL_TABLET | ORAL | Status: DC

## 2014-11-19 NOTE — Telephone Encounter (Signed)
Rx faxed to [facility] @ [phone], phone number [phone]  

## 2014-12-18 ENCOUNTER — Other Ambulatory Visit: Payer: Self-pay | Admitting: *Deleted

## 2014-12-18 MED ORDER — METHADONE HCL 10 MG PO TABS
ORAL_TABLET | ORAL | Status: DC
Start: 2014-12-18 — End: 2015-01-20

## 2014-12-18 NOTE — Telephone Encounter (Signed)
[facility] 

## 2014-12-24 ENCOUNTER — Other Ambulatory Visit: Payer: Self-pay | Admitting: *Deleted

## 2014-12-24 MED ORDER — OXYCODONE HCL 10 MG PO TABS: ORAL_TABLET | ORAL | Status: DC

## 2014-12-24 NOTE — Telephone Encounter (Signed)
[facility] 

## 2014-12-31 ENCOUNTER — Non-Acute Institutional Stay (SKILLED_NURSING_FACILITY): Payer: Medicare Other | Admitting: Internal Medicine

## 2014-12-31 DIAGNOSIS — J9610 Chronic respiratory failure, unspecified whether with hypoxia or hypercapnia: Secondary | ICD-10-CM

## 2014-12-31 DIAGNOSIS — D509 Iron deficiency anemia, unspecified: Secondary | ICD-10-CM

## 2014-12-31 DIAGNOSIS — K118 Other diseases of salivary glands: Secondary | ICD-10-CM

## 2015-01-02 NOTE — Progress Notes (Addendum)
Patient ID: [name], female   DOB: 1961-09-01, 54 y.o.   MRN: [id number]              PROGRESS NOTE  DATE:  12/31/2014       FACILITY: [facility]    LEVEL OF CARE:   SNF   Routine Visit   CHIEF COMPLAINT:  Follow up medical issues/routine visit.      HISTORY OF PRESENT ILLNESS:  [name] is a 54 year-old woman, initially admitted to the facility in October 2014.   At that point, she had come from a nursing home near [place].    She has a long list of chronic medical issues which I will summarize below.    There are also ongoing more acute issues for which the patient makes it difficult to arrange consultation and she does not follow up her appointment.  Most recently, I had asked for an ENT consultation in October with regards to her left parotid gland enlargement.  She apparently refused to go and will often do this just before the actual consult time.    The patient looks much the same as usual.  She is on chronic oxygen, although I am not actually sure she meets the criteria.    She has a suprapubic catheter.    She is mostly bedbound at her insistence, although she will get up and stand and pivot to the commode chair.  She has an Mining engineer wheelchair, but rarely gets out of bed or even out of her room.    PAST MEDICAL HISTORY/PROBLEM LIST:                 Morbid obesity with probable sleep apnea.    Chronic pain.  Felt to be a combination of generalized arthritis, fibromyalgia, and probably diabetic neuropathy.    Obstructive sleep apnea.    COPD.  On chronic oxygen for years.  She has generally not allowed me to consider her for appropriate studies here.    Chronic interstitial cystitis with a chronic suprapubic catheter.  She has had recurrent UTIs and/or complaints about a UTI, although some of these are clearly asymptomatic bacteruria.    Hypothyroidism.  On replacement.     Chronic allergic rhinitis.    Depression with a history of anxiety.     Type 2 diabetes with peripheral neuropathy.  On insulin.    Iron deficiency anemia.  Partially worked up during an admission to hospital in March 2015.  At that point, the patient was transfused.  Her iron indices clearly show iron deficiency, although she refuses to take iron in any form due to a multitude of real or perceived intolerances.    Left parotid gland enlargement.  CT scan done in October 2015 showed nonspecific findings involving both parotids with symmetric bilateral lymph node enlargement.  I had asked for an ENT consult.  She apparently refused to have this consultation.    CURRENT MEDICATIONS:                  Hydrochlorothiazide 25 q.d.     Claritin 10 q.d.    Prilosec 20 q.d.    Micro-K 10 q.d.    Zanaflex 2 mg by mouth daily for muscle spasms, at 9 a.m.            Zanaflex 4 mg at 9 p.m.    Vitamin D2, 50,000 U every 14 days.    Wellbutrin 300 daily.    Synthroid 250 daily.  Effexor XR 75, 1 tablet daily with breakfast.    Colace 200 q.d.    Neurontin 300 twice daily.    Amitiza 24 mcg twice daily.    MiraLAX 17 g twice daily.    Ditropan 5 mg twice daily for bladder spasms.    Methadone 30 mg three times a day for chronic pain.    Carafate 1 g four times a day for reflux.    Atrovent 2 puffs four times a day.    Flonase 50 mcg in both nostrils daily.    Trazodone 100 at 9 p.m.     Macrodantin 50 mg at bedtime for UTI prophylaxis.    Novolin 70/30, 32 U twice daily.    Insulin sliding scale.    LABS/RADIOLOGY:    CBGs are generally in the 100 range, although at h.s. it is more in the high 100s to low 200s.    She had a chest x-ray which was clear on 12/13/2014.    Hemoglobin A1c was 6.8 on 10/09/2014.    Last hemoglobin was 9 on 10/03/2014.    REVIEW OF SYSTEMS:   CHEST/RESPIRATORY:  Complains of episodic shortness of breath and cough.   GI:  Constipation.   GU:  Continuous bladder pain.    PHYSICAL EXAMINATION:   VITAL  SIGNS:   O2 SATURATIONS:  95% on 2 L.   PULSE:  92 and regular.    RESPIRATIONS: 18 and unlabored.   GENERAL APPEARANCE:  The patient is as pleasant and calm as I have seen her recently.   CHEST/RESPIRATORY:  Clear air entry bilaterally.   CARDIOVASCULAR:  CARDIAC:   Heart sounds are normal.  There are no murmurs.   GASTROINTESTINAL:  ABDOMEN:   Distended, but no masses.   GENITOURINARY:  BLADDER:    No suprapubic or costovertebral angle tenderness.   CIRCULATION:  EDEMA/VARICOSITIES:  Extremities:  No evidence of a DVT.   HEENT:  Once again, there is what appears to be left parotid gland enlargement.  There is no overt pain, although there has been in the past.    ASSESSMENT/PLAN:                   Chronic hypoxemia/hypoxic  respiratory failure.  Likely secondary to some combination of COPD, obstructive sleep apnea, and/or asthma.  Ideally, we would do a sleep study here, blood gas and probably PFTs, although I doubt she will really allow this.    Parotid gland enlargement, especially on the left; but by CT scan, left greater than right.  I had arranged an ENT follow-up here.  She did not keep the appointment.  I will try this one more time, although she has a disturbing habit of not following up on these appointments.  The staff state that this is a predictable pattern.    Chronic suprapubic catheter.  Chronic complaints of pelvic/dysuria type pain.  We offered to send her to a urologist.  She often insists on a urine culture.  I must admit, I am very reticent to do this as it always shows multiple gram-negative resistant organisms and I am never really convinced that this is actually a true infection versus colonization, although she did not mention this today.  At her request several months age I acquiesced to nitrofurantoin as prophylaxis although I am doubtful this will have benefit.   History of iron deficiency anemia.  I believe she had an endoscopy in March 2015 and a colonoscopy by  Dr. Dulce Sellarutlaw  on the same date.  The preparation was poor.  Nevertheless, there was no major issue found to explain her iron deficiency.  I had thought she had an endoscopy, although I do not actually see this.  She was not screened for iron malabsorption.  I will repeat her hemoglobin.  As noted above, she will not agree to iron in any form.

## 2015-01-09 ENCOUNTER — Encounter (HOSPITAL_COMMUNITY): Payer: Self-pay | Admitting: Gastroenterology

## 2015-01-20 ENCOUNTER — Other Ambulatory Visit: Payer: Self-pay | Admitting: *Deleted

## 2015-01-20 MED ORDER — METHADONE HCL 10 MG PO TABS: ORAL_TABLET | ORAL | Status: DC

## 2015-01-20 MED ORDER — OXYCODONE HCL 10 MG PO TABS: ORAL_TABLET | ORAL | Status: DC

## 2015-01-20 NOTE — Telephone Encounter (Signed)
[facility] 

## 2015-02-04 ENCOUNTER — Non-Acute Institutional Stay (SKILLED_NURSING_FACILITY): Payer: Medicare Other | Admitting: Internal Medicine

## 2015-02-04 DIAGNOSIS — J9610 Chronic respiratory failure, unspecified whether with hypoxia or hypercapnia: Secondary | ICD-10-CM | POA: Diagnosis not present

## 2015-02-04 DIAGNOSIS — D509 Iron deficiency anemia, unspecified: Secondary | ICD-10-CM

## 2015-02-04 DIAGNOSIS — K118 Other diseases of salivary glands: Secondary | ICD-10-CM | POA: Diagnosis not present

## 2015-02-07 NOTE — Progress Notes (Addendum)
Patient ID: [name], female   DOB: 1961/07/24, 54 y.o.   MRN: [id number]              PROGRESS NOTE  DATE:  02/04/2015              FACILITY: [facility]                  LEVEL OF CARE:   SNF   Routine Visit   CHIEF COMPLAINT:  Follow up medical issues/routine visit.    HISTORY OF PRESENT ILLNESS:  [name] is a lady who is 54 years old.  She came to the facility in October 2014, at this point from another nursing home near [place].    Recent issues have included a left parotid greater than right parotid gland enlargement for which I had wanted to send her to ENT, although she has not agreed to go on two separate occasions.  This is the usual pattern for this woman.    She is on chronic oxygen, although I am not completely sure for what.    She has a suprapubic catheter, apparently placed for chronic interstitial cystitis in the past.  She has constant complaints of pelvic issues which she relates too UTIs, although most of the time she cultures multi-drug resistant organisms which I think are asymptomatic.     The patient is mostly bedbound at her insistence.  She can get up and pivot to a commode chair.  She has an Mining engineer wheelchair, but she rarely comes out of her room.    PAST MEDICAL HISTORY/PROBLEM LIST:                      Probable sleep apnea and possible COPD and/or asthma.  I had wanted to consider her for PFTs at one point.  I doubt she would agree to this.    Chronic pain syndrome.  Felt to be a combination of generalized arthritis, fibromyalgia, and probably diabetic neuropathy.    Chronic interstitial cystitis, according to the patient, with a chronic suprapubic catheter.    Hypothyroidism.  On replacement.     Chronic allergic rhinitis.    Depression with a history of anxiety.     Type 2 diabetes with peripheral neuropathy.  On insulin.    Iron deficiency.  Partially worked up during the hospitalization in March 2015.  At that point, she was  transfused.  Her iron indices clearly show iron deficiency anemia although she refuses to take iron in any form, either orally or parenterally.    Left greater than right parotid gland enlargement.  Documented on a CT scan in October 2015.  I asked for an ENT consult on two occasions.  She has not agreed to this.    LABORATORY DATA:   Most recent CBGs are apparently well over 200 most of the time, although her recent hemoglobin A1c's have been quite satisfactory.    On 01/08/2015:  White count 6.5, hemoglobin 8.8, MCV 84.1, MCHC 29.2.  Differential count was normal.    Comprehensive metabolic panel:  Sodium 138, potassium 3.6, CO2 38, BUN 13, creatinine 0.7.    Liver function tests are normal.    Albumin 3.4.     Hemoglobin A1c 6.5.     REVIEW OF SYSTEMS:            CHEST/RESPIRATORY:  She complains of episodic shortness of breath, chest pain.   GI:  States her bowels are moving much better on  current laxatives.    GU:  Continuous bladder complaints, although there has been no evidence that she is systemically ill.    PHYSICAL EXAMINATION:           VITAL SIGNS:   O2 SATURATIONS:  96% on 2 L.    PULSE:  80 and regular.   RESPIRATIONS:  18 and unlabored.    GENERAL APPEARANCE:  The patient does not look unstable.   CHEST/RESPIRATORY:  Generally clear air entry bilaterally.  There is no wheezing.   CARDIOVASCULAR:  CARDIAC:   Heart sounds are normal.  There are no murmurs.   GASTROINTESTINAL:  ABDOMEN:   No tenderness.    VASCULAR:   ARTERIAL:  Extremities:  No evidence of a DVT.   CIRCULATION:   EDEMA/VARICOSITIES:  She has no edema.      DIABETIC FOOT EXAM:  Completely normal.  Peripheral pulses are palpable.  There are no pressure areas.  No open areas.  She does have diabetic neuropathy.    ASSESSMENT/PLAN:                  Chronic hypoxemic  respiratory failure.  Likely some combination of COPD, obstructive sleep apnea, ?obesity hypoventilation syndrome.  I do not know that  there is anything else that I am going to be able to do here.  Ideally PFT's, Sleep studies etc  Parotid gland enlargement.  I had wanted her seen by ENT.  She has not kept these appointments on now 2 attempts.   Chronic suprapubic catheter.  I had wanted to send her to a urologist although I do not think she agreed with this, either.    Iron deficiency anemia.  Last hemoglobin was 8.8.  I measure this occasionally.  We will probably do this towards the end of March.  She has not agreed to iron in any form, although will agree to transfusions.  She insisted today that she Should be transfused although she does not meet criteria currently. By previously serology this is clearly Fe def.   Type 2 diabetes with diabetic neuropathy.  Her blood sugars are elevated; yesterday, 221/243/213/459.  She currently is on 70/30 insulin, 32 U twice daily.  I will probably increase this a bit, although her hemoglobin A1c's have been quite satisfactory.    This patient continues to circumvent attempts for consultation/tests which have been clearly and repetitively explained to her.

## 2015-02-19 ENCOUNTER — Other Ambulatory Visit: Payer: Self-pay | Admitting: *Deleted

## 2015-02-19 MED ORDER — METHADONE HCL 10 MG PO TABS: ORAL_TABLET | ORAL | Status: DC

## 2015-03-17 ENCOUNTER — Other Ambulatory Visit: Payer: Self-pay | Admitting: *Deleted

## 2015-03-17 MED ORDER — METHADONE HCL 10 MG PO TABS: ORAL_TABLET | ORAL | Status: DC

## 2015-03-17 NOTE — Telephone Encounter (Signed)
[facility] 

## 2015-04-08 ENCOUNTER — Non-Acute Institutional Stay (SKILLED_NURSING_FACILITY): Payer: Medicare Other | Admitting: Internal Medicine

## 2015-04-08 DIAGNOSIS — K118 Other diseases of salivary glands: Secondary | ICD-10-CM | POA: Diagnosis not present

## 2015-04-08 DIAGNOSIS — J9610 Chronic respiratory failure, unspecified whether with hypoxia or hypercapnia: Secondary | ICD-10-CM

## 2015-04-08 DIAGNOSIS — D509 Iron deficiency anemia, unspecified: Secondary | ICD-10-CM

## 2015-04-08 DIAGNOSIS — J449 Chronic obstructive pulmonary disease, unspecified: Secondary | ICD-10-CM

## 2015-04-14 NOTE — Progress Notes (Addendum)
Patient ID: [name], female   DOB: December 29, 1960, 54 y.o.   MRN: [id number]                PROGRESS NOTE  DATE:  04/08/2015              FACILITY: [facility]                          LEVEL OF CARE:   SNF   Routine Visit                     CHIEF COMPLAINT:  Routine visit/follow-up of multiple medical issues.      HISTORY OF PRESENT ILLNESS:  [name] is a lady who came to [place] in 2014 from another nursing home in [place].    She has not been an easy patient to manage with regards to her ongoing medical concerns.  More recently, she has been refusing to have her suprapubic catheter changed.  She states she feels diffusely achy, especially in her lower back.     PAST MEDICAL HISTORY/PROBLEM LIST:              The patient states she has scaling around her scalp, which is probably seborrheic dermatitis.    Probable sleep apnea.    Possible COPD and/or asthma.  She seems to have chronic respiratory failure.  She is on oxygen.   Chronic pain syndrome.  Felt to be a combination of generalized osteoarthritis, fibromyalgia, and probable diabetic neuropathy.    Chronic interstitial cystitis, according to the patient, with a chronic suprapubic catheter.   I had wanted her to follow up with Urology, although she has not agreed to this and many other consultant appointments that I would otherwise consider for her.    Hypothyroidism.  On replacement.    Iron deficiency.  Partly worked up during her hospitalization in March 2015.  At that point, she was transfused.  She clearly has iron deficiency, but she refuses to take iron in any form, either orally or parenterally.    Right parotid gland enlargement.  Documented on a CT scan in October 2015.  I asked for an ENT consult on at least two occasions.  She did not carry through with this.    Chronic allergic rhinitis.      LABORATORY DATA:  Most recent lab work:           CBC on 03/07/2015 showed a white count of 6.2, a  hemoglobin of 8.9, a platelet count of 238.    Last hemoglobin A1c was 6.5 on 01/08/2015.    Liver function tests have been normal, also on 01/08/2015.    Albumin was 3.4 on 01/08/2015.        PHYSICAL EXAMINATION:   VITAL SIGNS:   O2 SATURATIONS:  95% on 2 L.   RESPIRATIONS:    18 and unlabored.   PULSE:     78 and regular.     GENERAL APPEARANCE:  The patient looks somewhat more lethargic than I am used to seeing.   CHEST/RESPIRATORY:  Clear air entry.  No wheezing.  I note for the first time, I think, digital clubbing.    CARDIOVASCULAR:   CARDIAC:  Heart sounds are normal.  There are no murmurs.    BREASTS:  No obvious masses.     GASTROINTESTINAL:   ABDOMEN:  Distended, but no masses are noted.    ASSESSMENT/PLAN:  Chronic hypoxemic respiratory failure.  The exact etiology here is unclear.  I had wanted to consider her for a sleep study and probably PFTs, although I doubt she would agree to any of this.    Parotid gland enlargement.  I do not feel this today.  I had wanted to follow this with ENT, although she did not keep the appointments.    Chronic suprapubic catheter.  I had wanted her to be seen by Urology, although she would not keep these appointments.  She has not been letting the staff change the catheter, but states she will let them do it tomorrow.    Iron deficiency anemia.  Her last hemoglobin was 8.8 on 01/08/2015.  I would like to recheck this.  She did have a work-up for this in March 2015.    Type 2 diabetes with neuropathy.  She is on 70/30 insulin, 35 U twice a day.   Her hemoglobin A1c has been fairly good.    Clubbing of the digits.  The patient states at one point she was told that she "had a spot deep in her right lung".  There have been unremarkable chest x-rays done in the facility.  I reviewed a PA and lateral chest x-ray from 02/22/2014 in [facility].  There is certainly no real evidence of a primary cardiopulmonary  problem here.  Specifically, I do not see anything in her right lung.    Morbid obesity.  I am sure her BMI would be over 40.    Seborrheic dermatitis.  I will leave Selsun Blue shampoo and hydrocortisone 1% topically p.r.n.    I have reviewed her iron studies from [facility] on 03/06/2014, at which time her iron was 29.  Her TIBC was 342, saturation at 8, ferritin at 6.  B12 and folate were normal.

## 2015-04-16 ENCOUNTER — Other Ambulatory Visit: Payer: Self-pay | Admitting: *Deleted

## 2015-04-16 MED ORDER — METHADONE HCL 10 MG PO TABS: ORAL_TABLET | ORAL | Status: DC

## 2015-04-22 ENCOUNTER — Non-Acute Institutional Stay (SKILLED_NURSING_FACILITY): Payer: Medicare Other | Admitting: Internal Medicine

## 2015-04-22 DIAGNOSIS — S93402A Sprain of unspecified ligament of left ankle, initial encounter: Secondary | ICD-10-CM

## 2015-04-22 DIAGNOSIS — L989 Disorder of the skin and subcutaneous tissue, unspecified: Secondary | ICD-10-CM

## 2015-04-24 NOTE — Progress Notes (Signed)
Patient ID: [name], female   DOB: 07/09/1961, 54 y.o.   MRN: [id number]                PROGRESS NOTE  DATE:  04/22/2015           FACILITY: [facility]                             LEVEL OF CARE:   SNF   Acute Visit                    CHIEF COMPLAINT:  Skin issues, status post fall.      HISTORY OF PRESENT ILLNESS:  [name] has had a series of skin issues, including a nonhealing wound on her forehead for which I sent her to see Dermatology probably a year ago.  The area was biopsied and culture obtained.  I think this turned out to be a constant scratching type of syndrome.    She tells me that in the last day or two, another painful area developed on her left abdomen.   She has similar lesions on her left anterior leg, although she is convinced that these are different.    Finally, she had a fall in the facility last night.  I received a notification.  She is complaining of left ankle pain and swelling, and I looked at this.      PHYSICAL EXAMINATION:   SKIN:   INSPECTION:  The area in question is almost a perfect circle on her left anterior abdomen.  It has a fibrinous surface eschar.  There is some firmness around this.  However, there is no obvious infection.  There are similar areas on her left anterior thigh.   MUSCULOSKELETAL:   EXTREMITIES:   LEFT LOWER EXTREMITY:  Left ankle:  There is swelling on the lateral aspect.  Some tenderness, although this is difficult to gauge with her.        ASSESSMENT/PLAN:                               Skin lesion on the left anterior abdomen.   This is almost perfectly circular.  It would be difficult to think that this could be due to scratching.  There are similar lesions on her left anterior leg.  I do not know exactly how these are forming and how they start.  The facility is using Bactroban and keeping them covered with gauze.  I think this is fine.  However, once again, skin biopsy does not seem out of the question if this  does not heal.    Left ankle trauma last night.  There is swelling on the lateral aspect.  This is probably a sprain, although an x-ray is in order.     CPT CODE: [id number]

## 2015-05-19 ENCOUNTER — Other Ambulatory Visit: Payer: Self-pay | Admitting: *Deleted

## 2015-05-19 MED ORDER — METHADONE HCL 10 MG PO TABS: ORAL_TABLET | ORAL | Status: DC

## 2015-05-19 NOTE — Telephone Encounter (Signed)
[facility] 

## 2015-05-20 ENCOUNTER — Non-Acute Institutional Stay (SKILLED_NURSING_FACILITY): Payer: Medicare Other | Admitting: Internal Medicine

## 2015-05-20 DIAGNOSIS — K118 Other diseases of salivary glands: Secondary | ICD-10-CM | POA: Diagnosis not present

## 2015-05-20 DIAGNOSIS — J9610 Chronic respiratory failure, unspecified whether with hypoxia or hypercapnia: Secondary | ICD-10-CM

## 2015-05-20 DIAGNOSIS — D509 Iron deficiency anemia, unspecified: Secondary | ICD-10-CM | POA: Diagnosis not present

## 2015-05-25 NOTE — Progress Notes (Addendum)
Patient ID: [name], female   DOB: 09/10/1961, 54 y.o.   MRN: [id number]                PROGRESS NOTE  DATE:  05/20/2015            FACILITY: [facility]                            LEVEL OF CARE:   SNF   Routine Visit                CHIEF COMPLAINT:  Routine visit/follow up medical issues.              HISTORY OF PRESENT ILLNESS:  [name] is a lady who came to us in 2014 from another nursing home in [place].    She has numerous medical conditions and concerns, most of which I have never been able to totally verify medically.    Her current concerns include the following:      Acute coughing, now with productive clear sputum.    Convinced she has a UTI around her suprapubic catheter.    Diffuse pain with a previous diagnosis of fibromyalgia, generalized arthritis, and probable diabetic neuropathy.    Abdominal bloating and chronic pain.    Oral irritation.    PAST MEDICAL HISTORY/PROBLEM LIST:                  Probable sleep apnea.     COPD and/or asthma.  She is on chronic oxygen and has chronic respiratory failure.    Chronic pain syndrome.  Felt to be a combination of fibromyalgia, generalized arthritis, and probable diabetic neuropathy.    Chronic interstitial cystitis with a suprapubic catheter.  She has not agreed to follow up with Urology.     Hypothyroidism.  On replacement.     History of iron deficiency.  Partially worked up during her hospitalization in March 2015.  At that point, she was transfused.  She clearly has iron deficiency, although she refuses to take iron in any form, either orally or parenterally.    History of right parotid gland enlargement, documented on a CT scan.    I asked for an ENT consult on at least two occasions.  She did not carry through with this.    Chronic allergic rhinitis.    CURRENT MEDICATIONS:  Medication list is reviewed.    LABORATORY DATA:   We have not had any recent lab work.    Last checked, her  hemoglobin was 8.9.  This was on 03/07/2015.    Last hemoglobin A1c was 6.5 on 01/08/2015.    REVIEW OF SYSTEMS:    GENERAL:  The patient states she is not feeling well.  Has a combination of outside social stresses, including illness in both of her parents.   CHEST/RESPIRATORY:  Coughing, as noted.   CARDIAC:  No clear chest pain.   GI:  Abdominal bloating.   GU:  States she knows that her bladder is painful.   MUSCULOSKELETAL:  Complains of chronic pain just about everywhere, "like you have the flu".    PHYSICAL EXAMINATION:   VITAL SIGNS:     PULSE:  72 and regular.   RESPIRATIONS:  20 and unlabored.   02 SATURATIONS:  96% on room air.   GENERAL APPEARANCE:  The patient does not appear to be in any distress, although she is anxious and upset.   CHEST/RESPIRATORY:  Shallow air entry.  She has mild expiratory wheezing.   CARDIOVASCULAR:   CARDIAC:  Heart sounds are normal.  There are no murmurs.    GASTROINTESTINAL:   ABDOMEN:  Very distended, without masses present.   LIVER/SPLEEN/KIDNEYS:  No liver, no spleen.     ASSESSMENT/PLAN:               Chronic hypoxemic respiratory failure.  The exact etiology here is not completely clear.  This may be obstructive lung disease and/or combined with obesity hypoventilation syndrome.  I do not think she would agree to a sleep study or PFTs.    Parotid gland enlargement.  I do not feel this again today.  She would not agree to follow up with ENT.    Chronic suprapubic catheter.  I have wanted to have her seen by Urology, although she would never keep these appointments.  She intermittently refuses to let the staff change the catheter.    Type 2 diabetes.  She is on 70/30 insulin, 35 U twice a day.  Her blood sugars appear to be not under great control, although they fluctuate widely from one day to the other at the same time of day.    I think a hemoglobin A1c is in order.    Morbid obesity.    Multiple skin issues.  There are multiple  scars on her arms.  She has two old areas, one on the lateral aspect of her abdomen and one on the left anterior thigh.  I suspect this is some form of self-inflicted injury.    Ileus.  Her abdomen is quiet.  She is on Amitiza 24 twice a day, MiraLAX 17 g b.i.d., Colace 100 b.i.d.  However, she is on a high dose of narcotics including methadone 30 mg three times a day as well as anticholinergic medications.    Suprapubic catheter for chronic interstitial cystitis.  I have not been able to get her to agree to see a urologist.  She complains of constant pain here.  I do not think she has a UTI.  However, when we have cultured her in the past, she grows multi-drug resistant organisms that she will not allow anything for except IV antibiotics.

## 2015-06-16 ENCOUNTER — Other Ambulatory Visit: Payer: Self-pay | Admitting: *Deleted

## 2015-06-16 MED ORDER — METHADONE HCL 10 MG PO TABS: ORAL_TABLET | ORAL | Status: DC

## 2015-06-16 NOTE — Telephone Encounter (Signed)
[facility] 

## 2015-07-01 ENCOUNTER — Non-Acute Institutional Stay (SKILLED_NURSING_FACILITY): Payer: Medicare Other | Admitting: Internal Medicine

## 2015-07-01 DIAGNOSIS — J9610 Chronic respiratory failure, unspecified whether with hypoxia or hypercapnia: Secondary | ICD-10-CM

## 2015-07-01 DIAGNOSIS — E1165 Type 2 diabetes mellitus with hyperglycemia: Secondary | ICD-10-CM

## 2015-07-01 DIAGNOSIS — E118 Type 2 diabetes mellitus with unspecified complications: Secondary | ICD-10-CM

## 2015-07-01 DIAGNOSIS — J449 Chronic obstructive pulmonary disease, unspecified: Secondary | ICD-10-CM | POA: Diagnosis not present

## 2015-07-01 DIAGNOSIS — IMO0002 Reserved for concepts with insufficient information to code with codable children: Secondary | ICD-10-CM

## 2015-07-04 ENCOUNTER — Other Ambulatory Visit: Payer: Self-pay | Admitting: *Deleted

## 2015-07-04 MED ORDER — OXYCODONE HCL 10 MG PO TABS: ORAL_TABLET | ORAL | Status: DC

## 2015-07-07 ENCOUNTER — Other Ambulatory Visit: Payer: Self-pay | Admitting: *Deleted

## 2015-07-07 MED ORDER — METHADONE HCL 10 MG PO TABS: ORAL_TABLET | ORAL | Status: DC

## 2015-07-07 NOTE — Telephone Encounter (Signed)
[facility] 

## 2015-07-07 NOTE — Progress Notes (Addendum)
Patient ID: [name], female   DOB: 03/14/1961, 54 y.o.   MRN: [id number]                PROGRESS NOTE  DATE:  07/01/2015          FACILITY: [facility]                    LEVEL OF CARE:   SNF   Routine Visit               CHIEF COMPLAINT:  Routine visit/follow up medical issues.    HISTORY OF PRESENT ILLNESS:  [name] is a patient who came to us in 2014 from another nursing home in [place].    She has numerous medical conditions and concerns, many of which I have not been able to totally verify medically.    She also is noncompliant.  Often refuses tests or investigations and/or consultations in spite of the fact that she promises me that she will be compliant.    Her current concerns include:    She is convinced that she has another UTI.  She was treated with seven days of [name] in late May for a culture that grew Pseudomonas and Providencia.  I think this is likely to be colonization in a chronically catheterized patient.    Irritation to both her eyes.    Abdominal bloating and chronic abdominal pain.    PAST MEDICAL HISTORY/PROBLEM LIST:              Probable sleep apnea.  She has never agreed to a sleep study.    COPD and/or asthma.  She is on chronic oxygen and has chronic respiratory failure.    Chronic pain syndrome.  Felt to be a combination of fibromyalgia, generalized osteoarthritis, and probable diabetic neuropathy.    Chronic interstitial cystitis with a suprapubic catheter.  She has generally not agreed to follow up with the urologist.    Hypothyroidism.  On replacement.    History of iron deficiency anemia.   Partially worked up during the hospitalization in March 2015.  At that point, she was transfused.  She clearly has iron deficiency, although the cause of this is not completely certain.  Complicated by the fact that she refuses to take iron in any form, either orally or parenterally.    History of right parotid gland enlargement.   Documented on a CT scan.  I asked for an ENT consult on two occasions.  She did not agree to carry out with this in spite of the fact that appointments were made.    Chronic allergic rhinitis.    CURRENT MEDICATIONS:  Medication list is reviewed.                 Hydrochlorothiazide 25 daily.    Claritin 10 q.d.       Prilosec 20 q.d.                    Micro-K 10 mEq daily.    Zanaflex 2 mg q.a.m.    Vitamin D2, 50,000 U every 14 days.    Wellbutrin XL 300 mg daily.    Synthroid 250 daily.     Effexor ER 75 daily.    Colace 200 b.i.d.      Neurontin 300 twice daily.     Amitiza 24 b.i.d.       MiraLAX 17 g twice daily.    Ditropan 5 mg twice daily.  Methadone 30 mg three times a day.     Carafate 1 g by mouth four times a day.    Flonase 1 spray into both nostrils daily.     Trazodone 100 mg q.h.s.      Nitrofurantoin 50 mg at night for UTI prophylaxis.    PHYSICAL EXAMINATION:   VITAL SIGNS:     TEMPERATURE:  97.5.     PULSE:  75.     RESPIRATIONS:  18.   BLOOD PRESSURE:  110/72.    02 SATURATIONS:  96% on 2 L.   GENERAL APPEARANCE:  The patient is not in any distress, although she looks fatigued.   CHEST/RESPIRATORY:  Shallow, but clear air entry.  There is no wheezing.  No accessory muscle use.  No clubbing.      CARDIOVASCULAR:   CARDIAC:  Heart sounds are normal.  There are no murmurs.    GASTROINTESTINAL:   ABDOMEN:  Very distended.  Bowel sounds only present in episodic rushes.   LIVER/SPLEEN/KIDNEYS:  No liver, no spleen.  No tenderness.     ASSESSMENT/PLAN:                 Chronic hypoxemic respiratory failure.  The exact etiology here is not clear.  I have wondered whether she has a combination of obstructive lung disease and sleep apnea/obesity hypoventilation.  I am very doubtful she would comply with sleep studies or PFTs.    Chronic suprapubic catheter with a history of chronic interstitial cystitis.  I have wanted her seen by Urology,  although she would never agree to keep these appointments.  She complains of constant pain.  I do not think she has a UTI.  I do think she is colonized with very resistant gram-negatives.     Type 2 diabetes.  On 70/30 insulin, 35 U twice a day.  Her most recent hemoglobin A1c on 05/23/2015 was 6.2.  This is almost too tightly controlled.    Hypothyroidism.  Her most recent TSH on 05/23/2015 was 0.831, indicating adequate control.    Ileus.  This is likely a combination of things including her narcotics as well as anticholinergic medications.  I am going to ask for an abdominal x-ray on her.     I have ordered an x-ray.  If she is impacted, then I will move towards trying something different in terms of her bowel regimen.    Her iron deficiency anemia is stable although she will not take iron.  Her last hemoglobin was 9.3 on 05/23/2015.

## 2015-07-30 ENCOUNTER — Other Ambulatory Visit: Payer: Self-pay | Admitting: *Deleted

## 2015-07-30 MED ORDER — DIAZEPAM 5 MG PO TABS: 5.0000 mg | ORAL_TABLET | Freq: Two times a day (BID) | ORAL | Status: DC | PRN

## 2015-08-12 ENCOUNTER — Non-Acute Institutional Stay (SKILLED_NURSING_FACILITY): Payer: Medicare Other | Admitting: Internal Medicine

## 2015-08-12 DIAGNOSIS — K560 Paralytic ileus: Secondary | ICD-10-CM

## 2015-08-12 DIAGNOSIS — D509 Iron deficiency anemia, unspecified: Secondary | ICD-10-CM | POA: Diagnosis not present

## 2015-08-12 DIAGNOSIS — J9610 Chronic respiratory failure, unspecified whether with hypoxia or hypercapnia: Secondary | ICD-10-CM | POA: Diagnosis not present

## 2015-08-15 NOTE — Progress Notes (Addendum)
Patient ID: [name], female   DOB: 1961-09-22, 54 y.o.   MRN: [id number]                PROGRESS NOTE  DATE:  08/12/2015                FAC[facility]: [facility]                         LEVEL OF CARE:   SNF   Acute Visit                CHIEF COMPLAINT:  Review of abdominal pain/distention/pharmacy concerns.    HISTORY OF PRESENT ILLNESS:  [name] is a patient who came here in 2014 from another nursing home in [place].    She has numerous verified medical conditions and concerns, but also other issues that I have never really been able to verify medically.    She is also a noncompliant patient.  Often refuses tests, investigations, or consultations in spite of the fact that I have tried on numerous times to arrange these.    Her most active concerns include:    Massive abdominal distention with chronic abdominal pain.    Continued concerns that she has a UTI.    Fullness at the angle of her jaw on the left.    PAST MEDICAL HISTORY/PROBLEM LIST:            Probable sleep apnea.    She tells me that she has had three previous sleep studies and that she "never goes into REM sleep".     Chronic COPD +/- asthma.  She has been on chronic oxygen and has chronic respiratory failure.  She tells me that she has chronically elevated pCO2s documented by blood gases in the past.    Chronic pain syndrome.   Felt to be a combination of fibromyalgia, generalized osteoarthritis, and probably diabetic neuropathy.    Chronic interstitial cystitis with a suprapubic catheter.  She has generally not agreed to follow up with Urology.  She is generally colonized with resistant gram-negative organism, and I have generally been reluctant to go ahead with urine cultures unless I have convincing evidence that she actually has an infection.    Hypothyroidism.  On replacement.    History of iron deficiency anemia.  Partially worked up during the hospitalization in March 2015.  At that point,  she was transfused.  She clearly has iron deficiency biochemically, although the cause of this is not completely certain.  She will not take iron in either form, either orally or parenterally.    History of parotid gland enlargement.  Documented on CT.  I asked for an ENT consultation on two occasions.  However, I never did get her to comply with the visits.    Chronic allergic rhinitis.    Hypertension.    Chronic narcotic use.    CURRENT MEDICATIONS:  Medication list is reviewed.                 Hydrochlorothiazide 25 q.d.       Claritin 10 q.d.      Prilosec 20 q.d.       Micro-K 10 mEq daily.    Zanaflex 2 mg daily for muscle spasms.    Vitamin D2, 50,000 U every 14 days.    Wellbutrin XL 300 mg daily.    Synthroid 250 mg daily for hypothyroidism.     Effexor XR 75 q.d.  Colace 200 daily.    Neurontin 300 twice daily.    Amitiza 24 b.i.d.      MiraLAX 17 g b.i.d.      Ditropan 5 mg twice daily for bladder spasms.     Methadone 30 mg three times a day.    Carafate 1 g four times a day.    Flonase 1 spray into both nostrils at 9 p.m.      Zanaflex 4 mg in the evening.    Trazodone 100 mg daily.    Nitrofurantoin  50 mg at night for UTI prophylaxis.    Novolin 70/30, 35 U twice a day.    Humalog sliding scale.    REVIEW OF SYSTEMS:    HEENT:  No clear headache.  However, she is complaining of hearing loss and a swollen parotid gland on the left.   CHEST/RESPIRATORY:  Chronic shortness of breath, but no wheezing.  No coughing.   CARDIAC:  She does not complain of chest pain.    GI:  No dysphagia.  She is complaining of increasing abdominal distention, pain.  She is having soft bowel movements, including one today.   GU:  Chronic pelvic pain and complaints of stool leaking from her vagina, although I have not heard of this from the staff.   MUSCULOSKELETAL:  Chronic widespread pain, mostly in her legs, knees, ankles.  NEUROLOGICAL:  She does not  complain of focal weakness.  Although she is mostly bedbound, the nurses tell me that she can get up and stand.  However, I have never really seen this.   PSYCHIATRIC:  Mental status:  Does not really complain of depression, although she is upset about her situation.       PHYSICAL EXAMINATION:   VITAL SIGNS:     TEMPERATURE:  98.9.   PULSE:  68.    RESPIRATIONS:  18.     BLOOD PRESSURE:  124/64.    02 SATURATIONS:  95% on 2 L.   GENERAL APPEARANCE:  The patient is not in any distress.  Oxygen on.      HEENT:   HEAD/FACE:  There does appear to be fullness on the left side of the jaw, although there is absolutely no tenderness here.   MOUTH/THROAT:  Oral exam is normal.   LYMPHATICS:  None palpable in the cervical, clavicular, or axillary areas.   CHEST/RESPIRATORY:  Diffusely shallow, but otherwise clear air entry.  There is no wheezing.  There is no accessory muscle use.      CARDIOVASCULAR:   CARDIAC:  Heart sounds are normal.  There are no murmurs.    GASTROINTESTINAL:   ABDOMEN:  Massively distended.  Bowel sounds positive in only rushes.  The distention looks asymmetric to the right.   GENITOURINARY:   BLADDER:  Suprapubic catheter site appears to be stable.     RECTAL/VAGINAL:  I did not do a rectal or vaginal exam today.    I will at least try to do some of this next week.   EDEMA/VARICOSITIES:  Extremities:  Minimal edema.   CIRCULATION:  Peripheral pulses are palpable.     ASSESSMENT/PLAN:                    Ileus.  I asked for an x-ray of her abdomen on 07/01/2015.  I do not see this result.  She will not allow either anything rectally, no enemas and/or Dulcolax suppositories.  She is on Amitiza, but I am wondering if  she might benefit from additional agents.  She is on a lot of medications that have anticholinergic properties, as well as the methadone which could be aggravating this.    Iron deficiency anemia.  Last hemoglobin was 8.6 on 04/14/2015.  This will need to be  followed.  Iron studies in the past have clearly shown this to be iron deficiency.  Again, she will not allow iron in any form.  An attempt at a colonoscopy in 2015 in March was a poor prep.  There were no other abnormalities seen.    Hypothyroidism.  On replacement.  TSH was ordered, although I do not see the results.    Enlargement of the parotid gland.  I had wanted to have this seen by ENT.  She did not keep the appointments.     Chronic respiratory failure.  Secondary to COPD, possibly sleep apnea.  She probably does have elevated CO2s based on an elevated CO2 on her chemistry (compensation).    I am increasingly worried about the degree of abdominal distention here.  I think this is impaction with or without ileus.  She will not agree to anything rectally, although I would like to check her next week.    It is time to follow up on her lab work including a TSH, hemoglobin A1c, CBC, basic metabolic panel.

## 2015-08-18 ENCOUNTER — Other Ambulatory Visit: Payer: Self-pay | Admitting: *Deleted

## 2015-08-18 MED ORDER — METHADONE HCL 10 MG PO TABS: ORAL_TABLET | ORAL | Status: DC

## 2015-08-18 NOTE — Telephone Encounter (Signed)
[facility] 

## 2015-08-19 ENCOUNTER — Non-Acute Institutional Stay (SKILLED_NURSING_FACILITY): Payer: Medicare Other | Admitting: Internal Medicine

## 2015-08-19 DIAGNOSIS — K560 Paralytic ileus: Secondary | ICD-10-CM | POA: Diagnosis not present

## 2015-08-19 DIAGNOSIS — J9610 Chronic respiratory failure, unspecified whether with hypoxia or hypercapnia: Secondary | ICD-10-CM | POA: Diagnosis not present

## 2015-08-19 DIAGNOSIS — K118 Other diseases of salivary glands: Secondary | ICD-10-CM | POA: Diagnosis not present

## 2015-08-19 DIAGNOSIS — J449 Chronic obstructive pulmonary disease, unspecified: Secondary | ICD-10-CM | POA: Diagnosis not present

## 2015-08-25 NOTE — Progress Notes (Addendum)
Patient ID: [name], female   DOB: 1961/09/05, 54 y.o.   MRN: [id number]                PROGRESS NOTE  DATE:  08/19/2015        FACILITY: [facility]                      LEVEL OF CARE:   SNF   Acute Visit             CHIEF COMPLAINT:  Review of ileus.    HISTORY OF PRESENT ILLNESS:  I saw [name] last week for massive abdominal distention.  Previous x-rays here have not shown a definite bowel obstruction; the appearance most consistent with an ileus.  This was last done on 07/02/2015.    The issue is somewhat complicated by refusals of different forms of laxatives.  I added Movantik last week, 12.5 after meals, due to her being on longstanding methadone.    I had ordered labs last week to check her chemistry, her hemoglobin A1c, although I do not see these results.    The patient is already on Amitiza, Colace, and MiraLAX 17 g b.i.d.    Past Medical History  Diagnosis Date  . Diabetes mellitus without complication   . Chronic pain   . Morbid obesity   . OSA (obstructive sleep apnea)   . Generalized weakness   . Fibromyalgia   . Lyme disease   . Anemia   . History of blood transfusion     pt states she has had 5 blood transfusions  . Sleep disorder   . UTI (lower urinary tract infection) 02/22/2014  . Hypertension   . Hypothyroidism   . COPD (chronic obstructive pulmonary disease)   . Shortness of breath   . GERD (gastroesophageal reflux disease)       CURRENT MEDICATIONS:  Medication list was reviewed last week.  Please see my note for this.    REVIEW OF SYSTEMS:    HEENT:  She is having irritated eyes with drainage.  She thinks she needs an antibiotic.   CHEST/RESPIRATORY:  Chronic shortness of breath, but no wheezing.   CARDIAC:  No chest pain.    GI:  No dysphagia.  She is having bowel movements which are formed.   GU:  She is having chronic pelvic pain.  She is convinced she has a horrible urinary tract infection.  She has a suprapubic catheter  in place chronically, which is due to be changed.   NEUROLOGICAL:  She does not complain of focal weakness.  She can get up and stand and transfer.  PSYCHIATRIC:  Did not easily complain of depression.  Has been followed by psychiatry in the facility      PHYSICAL EXAMINATION:   GENERAL APPEARANCE:  The patient is not in any distress.  HEENT: still some Parotid gland fullness on the left. No tenderness. I had wanted to send her to ENT but she would not comply        CHEST/RESPIRATORY:  Shallow, but otherwise clear air entry.   There is no wheezing.  Oxygen is on.   CARDIOVASCULAR:   CARDIAC:  Heart sounds are normal.   GASTROINTESTINAL:   ABDOMEN:  Much less distended than last week.  Bowel sounds are more active.   GENITOURINARY:   BLADDER:  There is no suprapubic tenderness.  The suprapubic catheter site looks to be stable.   EXTREMITIES: no evidence of a DVT  ASSESSMENT/PLAN:             Ileus.  I tried to check her chemistry.  I will need to see if I can find that but, clinically, this is a lot better.  She just started the Movantik yesterday.   This is apparently not covered by her insurance so it is only going to last a month.  I would like to add Dulcolax suppositories.    Complaints of severe "bladder pain".  Once again, I am almost scared to do a culture here as she is colonized with a multiplicity of resistant gram-negative organisms.  Nevertheless, I will do this after they change the catheter.    On Chronic O2: likely some combination of COPD and OHS. PFT's and a sleep study would be indicated but she will not agree  Parotid gland enlargement: I tried twice to set her up for an ENT. She did not attend    CPT CODE: 40981            ADDENDUM:  Her lab work from today:    CBC:  White count 5.4, hemoglobin 8.9, MCV 80.9, MCH 24, platelet count 189.      Basic metabolic panel:   Potassium 4, BUN 13, creatinine 0.82.    TSH:  Within the normal range at 0.616.    Hemoglobin  A1c:  Fine at 6.5.    All of this is quite satisfactory.

## 2015-10-09 ENCOUNTER — Other Ambulatory Visit: Payer: Self-pay | Admitting: *Deleted

## 2015-10-09 MED ORDER — OXYCODONE HCL 10 MG PO TABS: ORAL_TABLET | ORAL | Status: DC

## 2015-10-09 NOTE — Telephone Encounter (Signed)
[facility] 

## 2015-10-15 ENCOUNTER — Other Ambulatory Visit: Payer: Self-pay | Admitting: *Deleted

## 2015-10-15 MED ORDER — METHADONE HCL 10 MG PO TABS: ORAL_TABLET | ORAL | Status: DC

## 2015-10-21 ENCOUNTER — Non-Acute Institutional Stay (SKILLED_NURSING_FACILITY): Payer: Medicare Other | Admitting: Internal Medicine

## 2015-10-21 DIAGNOSIS — D509 Iron deficiency anemia, unspecified: Secondary | ICD-10-CM | POA: Diagnosis not present

## 2015-10-21 DIAGNOSIS — J9610 Chronic respiratory failure, unspecified whether with hypoxia or hypercapnia: Secondary | ICD-10-CM

## 2015-10-21 DIAGNOSIS — K560 Paralytic ileus: Secondary | ICD-10-CM | POA: Diagnosis not present

## 2015-10-23 ENCOUNTER — Other Ambulatory Visit: Payer: Self-pay

## 2015-10-23 MED ORDER — DIAZEPAM 5 MG PO TABS: 5.0000 mg | ORAL_TABLET | Freq: Two times a day (BID) | ORAL | Status: DC | PRN

## 2015-10-23 NOTE — Telephone Encounter (Signed)
Rx faxed to [facility] @ [phone], phone number [phone]  

## 2015-10-27 ENCOUNTER — Emergency Department (HOSPITAL_COMMUNITY): Payer: Medicare Other

## 2015-10-27 ENCOUNTER — Inpatient Hospital Stay (HOSPITAL_COMMUNITY)
Admission: EM | Admit: 2015-10-27 | Discharge: 2015-10-31 | DRG: 690 | Disposition: A | Discharge status: Skilled Nursing Facility | Payer: Medicare Other | Attending: Internal Medicine | Admitting: Internal Medicine

## 2015-10-27 ENCOUNTER — Encounter (HOSPITAL_COMMUNITY): Payer: Self-pay | Admitting: Emergency Medicine

## 2015-10-27 DIAGNOSIS — G894 Chronic pain syndrome: Secondary | ICD-10-CM | POA: Diagnosis present

## 2015-10-27 DIAGNOSIS — Z66 Do not resuscitate: Secondary | ICD-10-CM | POA: Diagnosis present

## 2015-10-27 DIAGNOSIS — N390 Urinary tract infection, site not specified: Principal | ICD-10-CM | POA: Diagnosis present

## 2015-10-27 DIAGNOSIS — E119 Type 2 diabetes mellitus without complications: Secondary | ICD-10-CM

## 2015-10-27 DIAGNOSIS — A6920 Lyme disease, unspecified: Secondary | ICD-10-CM | POA: Diagnosis present

## 2015-10-27 DIAGNOSIS — E039 Hypothyroidism, unspecified: Secondary | ICD-10-CM | POA: Diagnosis present

## 2015-10-27 DIAGNOSIS — R109 Unspecified abdominal pain: Secondary | ICD-10-CM | POA: Diagnosis present

## 2015-10-27 DIAGNOSIS — Z87891 Personal history of nicotine dependence: Secondary | ICD-10-CM

## 2015-10-27 DIAGNOSIS — E1140 Type 2 diabetes mellitus with diabetic neuropathy, unspecified: Secondary | ICD-10-CM | POA: Diagnosis present

## 2015-10-27 DIAGNOSIS — Z9119 Patient's noncompliance with other medical treatment and regimen: Secondary | ICD-10-CM

## 2015-10-27 DIAGNOSIS — Z993 Dependence on wheelchair: Secondary | ICD-10-CM

## 2015-10-27 DIAGNOSIS — J449 Chronic obstructive pulmonary disease, unspecified: Secondary | ICD-10-CM | POA: Diagnosis present

## 2015-10-27 DIAGNOSIS — I10 Essential (primary) hypertension: Secondary | ICD-10-CM | POA: Diagnosis present

## 2015-10-27 DIAGNOSIS — Z794 Long term (current) use of insulin: Secondary | ICD-10-CM

## 2015-10-27 DIAGNOSIS — G4733 Obstructive sleep apnea (adult) (pediatric): Secondary | ICD-10-CM | POA: Diagnosis present

## 2015-10-27 DIAGNOSIS — F332 Major depressive disorder, recurrent severe without psychotic features: Secondary | ICD-10-CM | POA: Diagnosis present

## 2015-10-27 DIAGNOSIS — D509 Iron deficiency anemia, unspecified: Secondary | ICD-10-CM | POA: Diagnosis present

## 2015-10-27 DIAGNOSIS — K219 Gastro-esophageal reflux disease without esophagitis: Secondary | ICD-10-CM | POA: Diagnosis present

## 2015-10-27 DIAGNOSIS — F419 Anxiety disorder, unspecified: Secondary | ICD-10-CM | POA: Diagnosis present

## 2015-10-27 DIAGNOSIS — Z79899 Other long term (current) drug therapy: Secondary | ICD-10-CM

## 2015-10-27 DIAGNOSIS — M797 Fibromyalgia: Secondary | ICD-10-CM | POA: Diagnosis present

## 2015-10-27 LAB — CBC
HCT: 30.1 % — ABNORMAL LOW (ref 36.0–46.0)
Hemoglobin: 8.6 g/dL — ABNORMAL LOW (ref 12.0–15.0)
MCH: 24.1 pg — ABNORMAL LOW (ref 26.0–34.0)
MCHC: 28.6 g/dL — ABNORMAL LOW (ref 30.0–36.0)
MCV: 84.3 fL (ref 78.0–100.0)
PLATELETS: 159 10*3/uL (ref 150–400)
RBC: 3.57 MIL/uL — ABNORMAL LOW (ref 3.87–5.11)
RDW: 15.2 % (ref 11.5–15.5)
WBC: 6.7 10*3/uL (ref 4.0–10.5)

## 2015-10-27 LAB — COMPREHENSIVE METABOLIC PANEL
ALT: 13 U/L — ABNORMAL LOW (ref 14–54)
AST: 16 U/L (ref 15–41)
Albumin: 3.1 g/dL — ABNORMAL LOW (ref 3.5–5.0)
Alkaline Phosphatase: 65 U/L (ref 38–126)
Anion gap: 11 (ref 5–15)
BUN: 8 mg/dL (ref 6–20)
CHLORIDE: 98 mmol/L — AB (ref 101–111)
CO2: 30 mmol/L (ref 22–32)
Calcium: 8.6 mg/dL — ABNORMAL LOW (ref 8.9–10.3)
Creatinine, Ser: 0.77 mg/dL (ref 0.44–1.00)
Glucose, Bld: 195 mg/dL — ABNORMAL HIGH (ref 65–99)
POTASSIUM: 4.1 mmol/L (ref 3.5–5.1)
Sodium: 139 mmol/L (ref 135–145)
Total Bilirubin: 0.2 mg/dL — ABNORMAL LOW (ref 0.3–1.2)
Total Protein: 6.4 g/dL — ABNORMAL LOW (ref 6.5–8.1)

## 2015-10-27 LAB — URINALYSIS, ROUTINE W REFLEX MICROSCOPIC
BILIRUBIN URINE: NEGATIVE
Glucose, UA: NEGATIVE mg/dL
KETONES UR: NEGATIVE mg/dL
NITRITE: POSITIVE — AB
PROTEIN: NEGATIVE mg/dL
Specific Gravity, Urine: 1.019 (ref 1.005–1.030)
UROBILINOGEN UA: 1 mg/dL (ref 0.0–1.0)
pH: 7.5 (ref 5.0–8.0)

## 2015-10-27 LAB — URINE MICROSCOPIC-ADD ON

## 2015-10-27 LAB — I-STAT TROPONIN, ED: TROPONIN I, POC: 0 ng/mL (ref 0.00–0.08)

## 2015-10-27 MED ORDER — DEXTROSE 5 % IV SOLN
1.0000 g | Freq: Once | INTRAVENOUS | Status: AC
  Administered 2015-10-27: 1 g via INTRAVENOUS
  Filled 2015-10-27: qty 10

## 2015-10-27 NOTE — Care Management (Signed)
ED [facility] concerning patient being brought in from SNF and being unsure of Facility accepting patient back, CM will consult CSW.

## 2015-10-27 NOTE — Progress Notes (Signed)
CSW spoke with Consulting civil engineerCharge RN at [facility].  [facility] is refusing to accept this patient back to their facility per their Librarian, academicxecutive Director.  CSW spoke with CSW AD re: situation and he will follow up with the state in the am.  [facility] RN informed.

## 2015-10-27 NOTE — ED Notes (Signed)
Pt continues to have multiple concerns.  Pt requesting ultrasounds, PET scans, addressing of many chronic medical issues all here in the ED today.  Pt states she wants to be admitted "for a few weeks so I can get all of my tests done".  Pt states she will not get the care she requires at her facility.  Social work/Case management already contacted, MD aware of patient's concerns.  This RN attempted to discuss with patient the responsibilities of the EDP and our addressing her medical emergencies today.  Patient verbalized understanding, but still requesting admission "because I said so".

## 2015-10-27 NOTE — ED Notes (Signed)
Per EMS:  [facility].  EMS called out due to AMS.  On arrival, pt is alert and oriented, but was notified today she will be moved to [place], without patient's consent.  Pt upset with staff.  Staff, at this time, tells EMS pt needs to be medically evaluated to be able to transfer to [place].  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  Pt complains today of CP, lower back pain, suprapubic pain, catheter leaking, tingling and numbness yesterday in the left arm (not totally resolved today).  A large amount of pt's belongings brought to ED by EMS.

## 2015-10-27 NOTE — ED Notes (Signed)
Social Work/Case Management made aware of patient's presence and probable need for intervention.

## 2015-10-27 NOTE — ED Provider Notes (Signed)
CSN: [id number]     Arrival date & time 10/27/15  1620 History   First MD Initiated Contact with Patient 10/27/15 1655     Chief Complaint  Patient presents with  . multiple complaints      (Consider location/radiation/quality/duration/timing/severity/associated sxs/prior Treatment) HPI Comments: [age]F with multiple chronic medical problems presents with multiple complaints. She is complaining that she is upset with her nursing home staff because they want to move her back to [place]. She had a difficult divorce and does not feel like she can go back to [place]. 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 She's also talking about some chest pain, belly pain, back pain. All these are chronic. She is also talking about some tingling in her bilateral hands that has improved since yesterday. There still both mildly tingling.  Patient is a 54 y.o. female presenting with general illness and abdominal pain. The history is provided by the patient.  Illness Associated symptoms: abdominal pain and chest pain   Associated symptoms: no fever, no shortness of breath and no vomiting   Abdominal Pain Pain location:  Generalized Pain quality: aching   Pain severity:  Moderate Onset quality:  Gradual Duration:  12 weeks Timing:  Constant Chronicity:  Chronic Relieved by:  Nothing Worsened by:  Nothing tried Associated symptoms: chest pain   Associated symptoms: no fever, no shortness of breath and no vomiting     Past Medical History  Diagnosis Date  . Diabetes mellitus without complication (HCC)   . Chronic pain   . Morbid obesity (HCC)   . OSA (obstructive sleep apnea)   . Generalized weakness   . Fibromyalgia   . Lyme disease   . Anemia   . History of blood transfusion     pt states she has had 5 blood transfusions  . Sleep disorder   . UTI (lower urinary tract infection) 02/22/2014  . Hypertension   . Hypothyroidism   . COPD (chronic obstructive pulmonary disease) (HCC)    . Shortness of breath   . GERD (gastroesophageal reflux disease)    Past Surgical History  Procedure Laterality Date  . Explaratory      on stomach,   . Appendectomy    . Foot surgery      due to stepping on broken glass  . Spinal tap    . Supra pubic catheter    . Esophagogastroduodenoscopy (egd) with propofol N/A 03/05/2014    Procedure: ESOPHAGOGASTRODUODENOSCOPY (EGD) WITH PROPOFOL;  Surgeon: Willis ModenaWilliam Outlaw, MD;  Location: [facility];  Service: Endoscopy;  Laterality: N/A;  . Colonoscopy with propofol N/A 03/05/2014    Procedure: COLONOSCOPY WITH PROPOFOL;  Surgeon: Willis ModenaWilliam Outlaw, MD;  Location: [facility];  Service: Endoscopy;  Laterality: N/A;  . Dental restoration/extraction with x-ray    . Colonoscopy with propofol N/A 03/06/2014    Procedure: COLONOSCOPY WITH PROPOFOL;  Surgeon: Willis ModenaWilliam Outlaw, MD;  Location: [facility];  Service: Endoscopy;  Laterality: N/A;   History reviewed. No pertinent family history. Social History  Substance Use Topics  . Smoking status: Former Smoker    Quit date: 06/22/2008  . Smokeless tobacco: None  . Alcohol Use: No   OB History    No data available     Review of Systems  Constitutional: Negative for fever.  Respiratory: Negative for shortness of breath.   Cardiovascular: Positive for chest pain.  Gastrointestinal: Positive for abdominal pain. Negative for vomiting.  All other systems reviewed and are negative.     Allergies  Ibuprofen; Sulfa antibiotics; Levofloxacin; Lisinopril; and Penicillins  Home Medications   Prior to Admission medications   Medication Sig Start Date End Date Taking? Authorizing Provider  acetaminophen (TYLENOL) 325 MG tablet Take 650 mg by mouth every 4 (four) hours as needed for mild pain.    Historical Provider, MD  benzocaine (ORAJEL) 10 % mucosal gel Use as directed 1 application in the mouth or throat 3 (three) times daily as needed for mouth pain.    Historical Provider, MD  bisacodyl  (DULCOLAX) 5 MG EC tablet Take 2 tablets (10 mg total) by mouth daily. 03/08/14   Leroy Sea, MD  buPROPion (WELLBUTRIN XL) 300 MG 24 hr tablet Take 300 mg by mouth daily.    Historical Provider, MD  cloNIDine (CATAPRES) 0.1 MG tablet Take 0.1 mg by mouth as needed.    Historical Provider, MD  diazepam (VALIUM) 10 MG tablet Take one tablet by mouth every night at bedtime as needed for anxiety 03/12/14   Oneal Grout, MD  diazepam (VALIUM) 2 MG tablet Take 1 tablet (2 mg total) by mouth 2 (two) times daily as needed for anxiety (and personal care). 03/08/14   Leroy Sea, MD  diazepam (VALIUM) 2 MG tablet Take one tablet by mouth twice daily as needed for anxiety and personal care 04/08/14   Tiffany L Reed, DO  diazepam (VALIUM) 5 MG tablet Take 1 tablet (5 mg total) by mouth every 12 (twelve) hours as needed for anxiety. 10/23/15   Sharon Seller, NP  docusate sodium 100 MG CAPS Take 200 mg by mouth 2 (two) times daily. 03/08/14   Leroy Sea, MD  fluticasone (FLONASE) 50 MCG/ACT nasal spray Place 1 spray into both nostrils daily.    Historical Provider, MD  gabapentin (NEURONTIN) 300 MG capsule Take 300 mg by mouth 2 (two) times daily.    Historical Provider, MD  guaifenesin (ROBITUSSIN) 100 MG/5ML syrup Take 200 mg by mouth every 4 (four) hours as needed for cough.    Historical Provider, MD  hydrochlorothiazide (HYDRODIURIL) 25 MG tablet Take 25 mg by mouth daily.    Historical Provider, MD  hydrOXYzine (ATARAX/VISTARIL) 25 MG tablet Take 25 mg by mouth every 8 (eight) hours as needed for itching.    Historical Provider, MD  insulin lispro (HUMALOG) 100 UNIT/ML injection Inject 0-8 Units into the skin See admin instructions. Sliding Scale - Inject before each meal and at bed time    Historical Provider, MD  insulin NPH-regular Human (NOVOLIN 70/30) (70-30) 100 UNIT/ML injection Inject 25 Units into the skin 2 (two) times daily with a meal. 03/11/14   Leroy Sea, MD   ipratropium-albuterol (DUONEB) 0.5-2.5 (3) MG/3ML SOLN Take 3 mLs by nebulization every 4 (four) hours as needed (for wheezing).    Historical Provider, MD  levothyroxine (SYNTHROID, LEVOTHROID) 300 MCG tablet Take 300 mcg by mouth daily before breakfast.    Historical Provider, MD  loperamide (IMODIUM) 1 MG/5ML solution Take 4 mg by mouth as needed for diarrhea or loose stools.    Historical Provider, MD  loratadine (CLARITIN) 10 MG tablet Take 10 mg by mouth daily.    Historical Provider, MD  lubiprostone (AMITIZA) 24 MCG capsule Take 24 mcg by mouth 2 (two) times daily with a meal.    Historical Provider, MD  methadone (DOLOPHINE) 10 MG tablet Take three tablets by mouth three times daily for pain 10/15/15   Kirt Boys, DO  methadone (DOLOPHINE) 5 MG tablet Take three  tablets by mouth daily at 8am and 11pm; Take two and 1/2 tablet by mouth once daily at 2pm 04/08/14   Tiffany L Reed, DO  nitroGLYCERIN (NITROSTAT) 0.4 MG SL tablet Place 0.4 mg under the tongue every 5 (five) minutes as needed for chest pain.    Historical Provider, MD  omeprazole (PRILOSEC) 20 MG capsule Take 20 mg by mouth daily.    Historical Provider, MD  oxybutynin (DITROPAN) 5 MG tablet Take 5 mg by mouth 3 (three) times daily.    Historical Provider, MD  oxyCODONE (ROXICODONE) 5 MG immediate release tablet Take two tablets by mouth every 8 hours as needed for severe pain; Take one tablet by mouth every four hours as needed for pain 03/11/14   Tiffany L Reed, DO  Oxycodone HCl 10 MG TABS Take one tablet by mouth every 4 hours as needed for pain 10/09/15   Sharon Seller, NP  phenazopyridine (PYRIDIUM) 200 MG tablet Take 200 mg by mouth 3 (three) times daily as needed for pain.    Historical Provider, MD  phenol-menthol (CEPASTAT) 14.5 MG lozenge Place 1 lozenge inside cheek as needed for sore throat.    Historical Provider, MD  Polyethyl Glycol-Propyl Glycol (SYSTANE) 0.4-0.3 % SOLN Place 2 drops into both eyes every 6  (six) hours as needed.    Historical Provider, MD  polyethylene glycol (MIRALAX / GLYCOLAX) packet Take 17 g by mouth 2 (two) times daily. 03/08/14   Leroy Sea, MD  Polyvinyl Alcohol-Povidone (FRESHKOTE) 2.7-2 % SOLN Place 1 drop into both eyes 2 (two) times daily.    Historical Provider, MD  potassium chloride (MICRO-K) 10 MEQ CR capsule Take 10 mEq by mouth daily.    Historical Provider, MD  sodium phosphate (FLEET) enema Place 1 enema rectally daily as needed. follow package directions    Historical Provider, MD  sucralfate (CARAFATE) 1 G tablet Take 1 g by mouth 4 (four) times daily -  with meals and at bedtime.    Historical Provider, MD  tizanidine (ZANAFLEX) 2 MG capsule Take 2 mg by mouth daily.     Historical Provider, MD  tiZANidine (ZANAFLEX) 4 MG capsule Take 4 mg by mouth every evening.     Historical Provider, MD  traZODone (DESYREL) 100 MG tablet Take 100 mg by mouth at bedtime.    Historical Provider, MD  venlafaxine XR (EFFEXOR-XR) 150 MG 24 hr capsule Take 150 mg by mouth daily with breakfast.    Historical Provider, MD  Vitamin D, Ergocalciferol, (DRISDOL) 50000 UNITS CAPS capsule Take 50,000 Units by mouth every 14 (fourteen) days.    Historical Provider, MD   BP 164/60 mmHg  Pulse 76  Temp(Src) 99.3 F (37.4 C) (Oral)  Resp 12  SpO2 100% Physical Exam  Constitutional: She is oriented to person, place, and time. She appears well-developed and well-nourished. No distress.  Morbidly obese, bed bound  HENT:  Head: Normocephalic and atraumatic.  Mouth/Throat: Oropharynx is clear and moist.  Eyes: EOM are normal. Pupils are equal, round, and reactive to light.  Neck: Normal range of motion. Neck supple.  Cardiovascular: Normal rate and regular rhythm.  Exam reveals no friction rub.   No murmur heard. Pulmonary/Chest: Effort normal and breath sounds normal. No respiratory distress. She has no wheezes. She has no rales.  Abdominal: Soft. She exhibits no distension.  There is no tenderness. There is no rebound.  Large ventral hernia, nontender  Musculoskeletal: Normal range of motion. She exhibits no edema.  Neurological: She is alert and oriented to person, place, and time. No cranial nerve deficit. She exhibits abnormal muscle tone (decreased tone in lower extremites from chronic non-ambulatory statusa). Coordination normal.  Skin: No rash noted. She is not diaphoretic.  Nursing note and vitals reviewed.   ED Course  Procedures (including critical care time) Labs Review Labs Reviewed  CBC  COMPREHENSIVE METABOLIC PANEL  URINALYSIS, ROUTINE W REFLEX MICROSCOPIC (NOT AT [facility])  I-STAT TROPOININ, ED    Imaging Review No results found. I have personally reviewed and evaluated these images and lab results as part of my medical decision-making.   EKG Interpretation   Date/Time:  Monday October 27 2015 16:35:35 EDT Ventricular Rate:  75 PR Interval:  66 QRS Duration: 93 QT Interval:  412 QTC Calculation: 460 R Axis:   38 Text Interpretation:  Sinus rhythm Short PR interval Probable left atrial  enlargement No significant change since last tracing Confirmed by Gwendolyn Grant   MD, Zahira Brummond ([id number]) on 10/27/2015 5:00:40 PM      MDM   Final diagnoses:  UTI (lower urinary tract infection)    54 year old female here with multiple complaints. She does have some somatic complaints of abdominal pain, chest pain that seemed to be chronic. She also has some bilateral hand tingling that she states is new but has improved since chest today. Most of her complaints seem to be psychosomatic and related to relationships with her nursing home. She continues to go on and on about how she is unhappy and cannot go back to [place]. She has no belly tenderness here. She has some decreased tone in her lower extremity is for chronic diameter 20 status, but she does not appear to be in any acute distress. Her vitals are stable. She is breathing okay and is on her baseline  oxygen. We'll check labs and a chest x-ray. Case management consulted, stated nursing home will not take her back. Patient admitted for UTI and to help with placement.    Elwin Mocha, MD 10/27/15 [phone]
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
## 2015-10-28 ENCOUNTER — Encounter (HOSPITAL_COMMUNITY): Payer: Self-pay | Admitting: Internal Medicine

## 2015-10-28 ENCOUNTER — Observation Stay (HOSPITAL_COMMUNITY): Payer: Medicare Other

## 2015-10-28 DIAGNOSIS — F332 Major depressive disorder, recurrent severe without psychotic features: Secondary | ICD-10-CM | POA: Diagnosis not present

## 2015-10-28 DIAGNOSIS — R109 Unspecified abdominal pain: Secondary | ICD-10-CM

## 2015-10-28 DIAGNOSIS — E119 Type 2 diabetes mellitus without complications: Secondary | ICD-10-CM

## 2015-10-28 DIAGNOSIS — E039 Hypothyroidism, unspecified: Secondary | ICD-10-CM | POA: Diagnosis not present

## 2015-10-28 DIAGNOSIS — N390 Urinary tract infection, site not specified: Secondary | ICD-10-CM | POA: Diagnosis not present

## 2015-10-28 HISTORY — DX: Unspecified abdominal pain: R10.9

## 2015-10-28 HISTORY — DX: Type 2 diabetes mellitus without complications: E11.9

## 2015-10-28 LAB — BASIC METABOLIC PANEL
Anion gap: 7 (ref 5–15)
BUN: 7 mg/dL (ref 6–20)
CHLORIDE: 97 mmol/L — AB (ref 101–111)
CO2: 32 mmol/L (ref 22–32)
Calcium: 8.3 mg/dL — ABNORMAL LOW (ref 8.9–10.3)
Creatinine, Ser: 0.91 mg/dL (ref 0.44–1.00)
GFR calc non Af Amer: >60 mL/min (ref >60)
Glucose, Bld: 113 mg/dL — ABNORMAL HIGH (ref 65–99)
POTASSIUM: 3.6 mmol/L (ref 3.5–5.1)
SODIUM: 136 mmol/L (ref 135–145)

## 2015-10-28 LAB — CBC
HEMATOCRIT: 28.4 % — AB (ref 36.0–46.0)
HEMOGLOBIN: 8.4 g/dL — AB (ref 12.0–15.0)
MCH: 24.9 pg — ABNORMAL LOW (ref 26.0–34.0)
MCHC: 29.6 g/dL — ABNORMAL LOW (ref 30.0–36.0)
MCV: 84.3 fL (ref 78.0–100.0)
Platelets: 166 10*3/uL (ref 150–400)
RBC: 3.37 MIL/uL — AB (ref 3.87–5.11)
RDW: 15.4 % (ref 11.5–15.5)
WBC: 5.6 10*3/uL (ref 4.0–10.5)

## 2015-10-28 LAB — MRSA PCR SCREENING: MRSA by PCR: POSITIVE — AB

## 2015-10-28 LAB — GLUCOSE, CAPILLARY
GLUCOSE-CAPILLARY: 145 mg/dL — AB (ref 65–99)
GLUCOSE-CAPILLARY: 85 mg/dL (ref 65–99)
Glucose-Capillary: 124 mg/dL — ABNORMAL HIGH (ref 65–99)
Glucose-Capillary: 106 mg/dL — ABNORMAL HIGH (ref 65–99)

## 2015-10-28 MED ORDER — HYDROCHLOROTHIAZIDE 25 MG PO TABS
25.0000 mg | ORAL_TABLET | Freq: Every day | ORAL | Status: DC
  Administered 2015-10-28 – 2015-10-31 (×4): 25 mg via ORAL
  Filled 2015-10-28 (×4): qty 1

## 2015-10-28 MED ORDER — BUPROPION HCL ER (XL) 300 MG PO TB24
300.0000 mg | ORAL_TABLET | Freq: Every day | ORAL | Status: DC
  Administered 2015-10-28 – 2015-10-31 (×4): 300 mg via ORAL
  Filled 2015-10-28 (×5): qty 1

## 2015-10-28 MED ORDER — IOHEXOL 300 MG/ML  SOLN
100.0000 mL | Freq: Once | INTRAMUSCULAR | Status: DC | PRN
  Administered 2015-10-28: 100 mL via INTRAVENOUS
  Filled 2015-10-28: qty 100

## 2015-10-28 MED ORDER — PANTOPRAZOLE SODIUM 40 MG PO TBEC
40.0000 mg | DELAYED_RELEASE_TABLET | Freq: Every day | ORAL | Status: DC
  Administered 2015-10-28 – 2015-10-31 (×4): 40 mg via ORAL
  Filled 2015-10-28 (×4): qty 1

## 2015-10-28 MED ORDER — GABAPENTIN 300 MG PO CAPS
300.0000 mg | ORAL_CAPSULE | Freq: Two times a day (BID) | ORAL | Status: DC
  Administered 2015-10-28 (×2): 300 mg via ORAL
  Filled 2015-10-28 (×2): qty 1

## 2015-10-28 MED ORDER — BENZOCAINE 10 % MT GEL
1.0000 "application " | Freq: Three times a day (TID) | OROMUCOSAL | Status: DC | PRN
  Filled 2015-10-28: qty 9.4

## 2015-10-28 MED ORDER — TRAZODONE HCL 100 MG PO TABS
100.0000 mg | ORAL_TABLET | Freq: Every day | ORAL | Status: DC
  Administered 2015-10-28 – 2015-10-30 (×4): 100 mg via ORAL
  Filled 2015-10-28 (×4): qty 1

## 2015-10-28 MED ORDER — VENLAFAXINE HCL ER 75 MG PO CP24
75.0000 mg | ORAL_CAPSULE | Freq: Every day | ORAL | Status: DC
  Administered 2015-10-28: 75 mg via ORAL
  Filled 2015-10-28 (×2): qty 1

## 2015-10-28 MED ORDER — DEXTROSE 5 % IV SOLN
1.0000 g | INTRAVENOUS | Status: DC
  Administered 2015-10-28: 1 g via INTRAVENOUS
  Filled 2015-10-28 (×2): qty 10

## 2015-10-28 MED ORDER — PAROXETINE HCL ER 25 MG PO TB24
25.0000 mg | ORAL_TABLET | Freq: Every day | ORAL | Status: DC
  Administered 2015-10-28 – 2015-10-31 (×4): 25 mg via ORAL
  Filled 2015-10-28 (×4): qty 1

## 2015-10-28 MED ORDER — TIZANIDINE HCL 4 MG PO TABS
4.0000 mg | ORAL_TABLET | Freq: Every day | ORAL | Status: DC
  Administered 2015-10-28 – 2015-10-30 (×4): 4 mg via ORAL
  Filled 2015-10-28 (×10): qty 1

## 2015-10-28 MED ORDER — SODIUM CHLORIDE 0.9 % IJ SOLN
3.0000 mL | Freq: Two times a day (BID) | INTRAMUSCULAR | Status: DC
  Administered 2015-10-28 – 2015-10-31 (×8): 3 mL via INTRAVENOUS

## 2015-10-28 MED ORDER — POTASSIUM CHLORIDE ER 10 MEQ PO TBCR
10.0000 meq | EXTENDED_RELEASE_TABLET | Freq: Every day | ORAL | Status: DC
  Administered 2015-10-28 – 2015-10-31 (×4): 10 meq via ORAL
  Filled 2015-10-28 (×8): qty 1

## 2015-10-28 MED ORDER — DIAZEPAM 5 MG PO TABS: 5.0000 mg | ORAL_TABLET | Freq: Two times a day (BID) | ORAL | Status: DC | PRN

## 2015-10-28 MED ORDER — ONDANSETRON HCL 4 MG PO TABS: 4.0000 mg | ORAL_TABLET | Freq: Four times a day (QID) | ORAL | Status: DC | PRN

## 2015-10-28 MED ORDER — ONDANSETRON HCL 4 MG/2ML IJ SOLN: 4.0000 mg | Freq: Four times a day (QID) | INTRAMUSCULAR | Status: DC | PRN

## 2015-10-28 MED ORDER — ACETAMINOPHEN 325 MG PO TABS
650.0000 mg | ORAL_TABLET | Freq: Four times a day (QID) | ORAL | Status: DC | PRN
  Administered 2015-10-29: 650 mg via ORAL
  Filled 2015-10-28 (×2): qty 2

## 2015-10-28 MED ORDER — GUAIFENESIN 100 MG/5ML PO SYRP
200.0000 mg | ORAL_SOLUTION | ORAL | Status: DC | PRN
  Filled 2015-10-28: qty 10

## 2015-10-28 MED ORDER — TIZANIDINE HCL 2 MG PO TABS
2.0000 mg | ORAL_TABLET | Freq: Every day | ORAL | Status: DC
  Administered 2015-10-28 – 2015-10-31 (×4): 2 mg via ORAL
  Filled 2015-10-28 (×4): qty 1

## 2015-10-28 MED ORDER — INSULIN ASPART 100 UNIT/ML ~~LOC~~ SOLN
0.0000 [IU] | Freq: Three times a day (TID) | SUBCUTANEOUS | Status: DC
  Administered 2015-10-28: 1 [IU] via SUBCUTANEOUS
  Administered 2015-10-28 – 2015-10-29 (×2): 2 [IU] via SUBCUTANEOUS
  Administered 2015-10-29: 3 [IU] via SUBCUTANEOUS
  Administered 2015-10-30 (×3): 2 [IU] via SUBCUTANEOUS
  Administered 2015-10-31 (×3): 3 [IU] via SUBCUTANEOUS

## 2015-10-28 MED ORDER — ACETAMINOPHEN 650 MG RE SUPP: 650.0000 mg | Freq: Four times a day (QID) | RECTAL | Status: DC | PRN

## 2015-10-28 MED ORDER — DOCUSATE SODIUM 100 MG PO CAPS
200.0000 mg | ORAL_CAPSULE | Freq: Two times a day (BID) | ORAL | Status: DC
  Administered 2015-10-28 – 2015-10-31 (×7): 200 mg via ORAL
  Filled 2015-10-28 (×8): qty 2

## 2015-10-28 MED ORDER — METHADONE HCL 10 MG PO TABS
30.0000 mg | ORAL_TABLET | Freq: Three times a day (TID) | ORAL | Status: DC
  Administered 2015-10-28 – 2015-10-31 (×12): 30 mg via ORAL
  Filled 2015-10-28 (×12): qty 3

## 2015-10-28 MED ORDER — OXYBUTYNIN CHLORIDE 5 MG PO TABS
5.0000 mg | ORAL_TABLET | Freq: Two times a day (BID) | ORAL | Status: DC
  Administered 2015-10-28 – 2015-10-31 (×7): 5 mg via ORAL
  Filled 2015-10-28 (×7): qty 1

## 2015-10-28 MED ORDER — POLYETHYLENE GLYCOL 3350 17 G PO PACK
17.0000 g | PACK | Freq: Every day | ORAL | Status: DC
  Administered 2015-10-29 – 2015-10-31 (×3): 17 g via ORAL
  Filled 2015-10-28 (×4): qty 1

## 2015-10-28 MED ORDER — BISACODYL 5 MG PO TBEC
5.0000 mg | DELAYED_RELEASE_TABLET | ORAL | Status: DC
  Administered 2015-10-28 – 2015-10-30 (×2): 5 mg via ORAL
  Filled 2015-10-28 (×2): qty 1

## 2015-10-28 MED ORDER — NITROGLYCERIN 0.4 MG SL SUBL
0.4000 mg | SUBLINGUAL_TABLET | SUBLINGUAL | Status: DC | PRN
  Filled 2015-10-28: qty 1

## 2015-10-28 MED ORDER — GABAPENTIN 300 MG PO CAPS
300.0000 mg | ORAL_CAPSULE | Freq: Three times a day (TID) | ORAL | Status: DC
  Administered 2015-10-28 – 2015-10-31 (×10): 300 mg via ORAL
  Filled 2015-10-28 (×11): qty 1

## 2015-10-28 MED ORDER — ENOXAPARIN SODIUM 40 MG/0.4ML ~~LOC~~ SOLN
40.0000 mg | SUBCUTANEOUS | Status: DC
  Administered 2015-10-28 – 2015-10-31 (×4): 40 mg via SUBCUTANEOUS
  Filled 2015-10-28 (×4): qty 0.4

## 2015-10-28 MED ORDER — OXYCODONE HCL 5 MG PO TABS
10.0000 mg | ORAL_TABLET | ORAL | Status: DC | PRN
  Administered 2015-10-28 – 2015-10-31 (×6): 10 mg via ORAL
  Filled 2015-10-28 (×10): qty 2

## 2015-10-28 MED ORDER — INSULIN ASPART PROT & ASPART (70-30 MIX) 100 UNIT/ML ~~LOC~~ SUSP
35.0000 [IU] | Freq: Two times a day (BID) | SUBCUTANEOUS | Status: DC
  Administered 2015-10-28 – 2015-10-31 (×7): 35 [IU] via SUBCUTANEOUS
  Filled 2015-10-28: qty 10

## 2015-10-28 MED ORDER — LUBIPROSTONE 24 MCG PO CAPS
24.0000 ug | ORAL_CAPSULE | Freq: Two times a day (BID) | ORAL | Status: DC
  Administered 2015-10-28 – 2015-10-31 (×8): 24 ug via ORAL
  Filled 2015-10-28 (×8): qty 1

## 2015-10-28 MED ORDER — DIAZEPAM 5 MG PO TABS
5.0000 mg | ORAL_TABLET | Freq: Two times a day (BID) | ORAL | Status: DC
  Administered 2015-10-28 – 2015-10-31 (×7): 5 mg via ORAL
  Filled 2015-10-28 (×7): qty 1

## 2015-10-28 MED ORDER — LEVOTHYROXINE SODIUM 125 MCG PO TABS
250.0000 ug | ORAL_TABLET | Freq: Every day | ORAL | Status: DC
  Administered 2015-10-28 – 2015-10-31 (×4): 250 ug via ORAL
  Filled 2015-10-28 (×5): qty 2

## 2015-10-28 MED ORDER — SUCRALFATE 1 G PO TABS
1.0000 g | ORAL_TABLET | Freq: Three times a day (TID) | ORAL | Status: DC
  Administered 2015-10-28 – 2015-10-31 (×15): 1 g via ORAL
  Filled 2015-10-28 (×15): qty 1

## 2015-10-28 MED ORDER — VITAMIN D (ERGOCALCIFEROL) 1.25 MG (50000 UNIT) PO CAPS: 50000.0000 [IU] | ORAL_CAPSULE | ORAL | Status: DC

## 2015-10-28 NOTE — Progress Notes (Signed)
Patient admitted after midnight. Chart reveiwed. Patient examined.  Came from [facility] where she resided for 2 years, and does not want to go back, but doubt she can manage on her own. SW consulted.  abd pain seems improved. Demanding regaular diet, not carb mod, heart healthy "because it's my civil right!" will change per patients demand, though advised against. Has chronic SP cath, so UA difficult to interpret, but currently treating with rocephin. Will order culture and continue.  PT eval pending  Requesting psych consult "for mental health" namely depression and panic. Will consult Psych.  Karen Curborinna Finleigh Cheong, MD [facility]

## 2015-10-28 NOTE — Progress Notes (Addendum)
Patient ID: [name], female   DOB: 09-19-1961, 54 y.o.   MRN: [id number]                PROGRESS NOTE  DATE:  10/21/2015        FACILITY: [facility]                       LEVEL OF CARE:   SNF   Routine Visit            CHIEF COMPLAINT:  Routine visit to follow medical issues.      HISTORY OF PRESENT ILLNESS:  [name] is a patient whom I have had here for two years.    She has a multitude of definitive medical issues and some that she claims to have for which I do not really have medical documentation of.    I last saw her at the end of August with massive abdominal distention bordering on a paralytic ileus.  She has a history of impaction.  We managed this aggressively and managed to keep her in the facility.    PAST MEDICAL HISTORY/PROBLEM LIST:         Chronic hypoxemic respiratory failure.  She has been on chronic oxygen for years.  The exact etiology of this is unclear.  I had wanted to consider her for a sleep study and probably PFTs, although she probably would not agree to this.    Parotid gland enlargement.  I did not feel this today when I saw her, although this has been significant and documented by CT scan.  I had wanted her seen by ENT, although she did not keep the appointments.    Chronic suprapubic catheter secondary to chronic interstitial nephritis.    Iron deficiency anemia.  I will need to recheck her hemoglobin.  Again, she is resistant to iron in any form.    Type 2 diabetes with neuropathy.     Digital clubbing.   She will need a follow-up chest x-ray and maybe a CT scan of her chest if she will allow me to do this.    Morbid obesity with immobility.    History of seborrheic dermatitis.    The patient states she has some form of immune deficiency, although I have never been able to document this.     CURRENT MEDICATIONS:  Medication list is reviewed.    LABORATORY DATA:   Last lab work on 08/18/2015:    CBC:  White count 5.4,  hemoglobin 8.9, MCV normal at 80.9, MCHC at 29.7, platelet count 189.    Basic metabolic panel:  Sodium 146, potassium 4, CO2 of 39, BUN 13, creatinine 0.82.    Hemoglobin A1c 6.5.     On 08/20/2015:    Urinalysis:  Occult blood 3+, positive nitrite, white blood count 6-10.  Colony count for C&S was 55,000.     REVIEW OF SYSTEMS:    HEENT:   She does not complain of headache.    CHEST/RESPIRATORY:  States she has been wheezing, short of breath, and coughing up large amounts of sputum.      CARDIAC:  No chest pain.   GI:  Her abdomen is distended, although she thinks it is a lot better this week since she had a bowel movement.   GU:  Chronic bladder pain.    MUSCULOSKELETAL:  She does not complain of shoulder pain.   States she has chronic pain in her legs.   NEUROLOGICAL:  States she can walk perhaps two yards and transfer into her commode chair or the wheelchair.    PHYSICAL EXAMINATION:   VITAL SIGNS:     PULSE:  71.   RESPIRATIONS:  20.   BLOOD PRESSURE:  149/74.    02 SATURATIONS:  98% on 2 L.    GENERAL APPEARANCE:  The patient is not in any distress.        CHEST/RESPIRATORY:  Reasonably clear air entry bilaterally.  Digital clubbing CARDIOVASCULAR:   CARDIAC:  Heart sounds are normal.  There are no murmurs.   Jugular venous pressure is not elevated.   GASTROINTESTINAL:   ABDOMEN:  Distended.  Bowel sounds are positive.   No masses.    LIVER/SPLEEN/KIDNEYS:  No liver, no spleen.  No tenderness.    GENITOURINARY:   BLADDER:  Suprapubic catheter in place.   CIRCULATION:   EDEMA/VARICOSITIES:  Extremities:  No evidence of a DVT.     ASSESSMENT/PLAN:                 Fecal impaction/paralytic ileus.  This seems stable.    Iron deficiency anemia.  Complicated by, according to the patient, an absolute intolerance to either oral or parenteral iron.    Digital clubbing.  I am going to repeat a chest x-ray on her.  Ideally, I would like to do PFTs, perhaps a CT scan of the  chest, although I suspect she would not keep these appointments.  I will try to do a PA and lateral chest in the facility.    Chronic interstitial cystitis with a Foley catheter in place.  I never had any further information on this.    Hypothyroidism.  Last TSH was 0.831 on 05/23/2015.    Type 2 diabetes with nephropathy.   Last hemoglobin A1c was 6.2, also on 05/23/2015.    Low vitamin D level.  I did put her on replacement.  This will need to be rechecked at some point.    Social issues.  The patient is adamant about leaving here, although she does not have a coherent plan about where she would go, nor who would look after her.  She wants to have a CAPS worker, some form of handicapped apartment.  I do not know what has been done so far towards this regard.  She apparently has been visited by the [facility] people and the question has come up about whether she really requires skilled care.  Since I really have no idea what her care level is, I do not know that this question can be safely answered.  I do not think she could manage on her own.  She would need significant in-home support.

## 2015-10-28 NOTE — Progress Notes (Signed)
Inpatient Rehabilitation  Received prescreen request from PT and have reviewed chart for appropriateness for Inpatient Rehabilitation. At his time we are not recommending an Inpatient Rehabilitation consult due to patient not having medical necessity to meet criteria. Please call if questions.  Weldon PickingSusan Onofrio Klemp PT Inpatient Rehab Admissions Coordinator Cell [phone] Office [phone]
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
## 2015-10-28 NOTE — H&P (Signed)
[facility] History and Physical  [name] VHQ:[id number] DOB: 1961-12-09 DOA: 10/27/2015  Referring physician: Dr. Gwendolyn Grant. PCP: Terald Sleeper, MD  Specialists: None.  Chief Complaint: Abdominal discomfort.  HPI: [name] is a 54 y.o. female history of chronic abdominal pain, chronic pain syndrome, OSA, hypertension, hypothyroidism was brought to the ER from the nursing home facility after patient was complaining of abdominal pain. Patient has chronic laxity of the abdominal wall. Patient denies any nausea vomiting or diarrhea. The patient's UA shows features concerning for UTI. CT abdomen is pending and patient has been admitted for further observation. Denies any chest pain or shortness of breath. Patient is wheelchair bound with previous history of neuropathy and Lyme disease.  Review of Systems: As presented in the history of presenting illness, rest negative.  Past Medical History  Diagnosis Date  . Diabetes mellitus without complication (HCC)   . Chronic pain   . Morbid obesity (HCC)   . OSA (obstructive sleep apnea)   . Generalized weakness   . Fibromyalgia   . Lyme disease   . Anemia   . History of blood transfusion     pt states she has had 5 blood transfusions  . Sleep disorder   . UTI (lower urinary tract infection) 02/22/2014  . Hypertension   . Hypothyroidism   . COPD (chronic obstructive pulmonary disease) (HCC)   . Shortness of breath   . GERD (gastroesophageal reflux disease)    Past Surgical History  Procedure Laterality Date  . Explaratory      on stomach,   . Appendectomy    . Foot surgery      due to stepping on broken glass  . Spinal tap    . Supra pubic catheter    . Esophagogastroduodenoscopy (egd) with propofol N/A 03/05/2014    Procedure: ESOPHAGOGASTRODUODENOSCOPY (EGD) WITH PROPOFOL;  Surgeon: Willis Modena, MD;  Location: [facility];  Service: Endoscopy;  Laterality: N/A;  . Colonoscopy with propofol N/A 03/05/2014     Procedure: COLONOSCOPY WITH PROPOFOL;  Surgeon: Willis Modena, MD;  Location: [facility];  Service: Endoscopy;  Laterality: N/A;  . Dental restoration/extraction with x-ray    . Colonoscopy with propofol N/A 03/06/2014    Procedure: COLONOSCOPY WITH PROPOFOL;  Surgeon: Willis Modena, MD;  Location: [facility];  Service: Endoscopy;  Laterality: N/A;   Social History:  reports that she quit smoking about 7 years ago. She does not have any smokeless tobacco history on file. She reports that she does not drink alcohol or use illicit drugs. Where does patient live nursing home. Can patient participate in ADLs? No.  Allergies  Allergen Reactions  . Ibuprofen Other (See Comments) and Shortness Of Breath  . Sulfa Antibiotics Other (See Comments), Itching and Shortness Of Breath  . Levofloxacin Nausea Only    Other fluoroquinolones OK.  . Lisinopril Other (See Comments)  . Penicillins Other (See Comments) and Hives    Family History:  Family History  Problem Relation Age of Onset  . Diabetes Mellitus II Mother       Prior to Admission medications   Medication Sig Start Date End Date Taking? Authorizing Provider  acetaminophen (TYLENOL) 325 MG tablet Take 650 mg by mouth every 4 (four) hours as needed for mild pain.   Yes Historical Provider, MD  benzocaine (ORAJEL) 10 % mucosal gel Use as directed 1 application in the mouth or throat 3 (three) times daily as needed for mouth pain.   Yes Historical Provider, MD  bisacodyl (  DULCOLAX) 5 MG EC tablet Take 2 tablets (10 mg total) by mouth daily. Patient taking differently: Take 5 mg by mouth every other day.  03/08/14  Yes Leroy SeaPrashant K Singh, MD  buPROPion (WELLBUTRIN XL) 300 MG 24 hr tablet Take 300 mg by mouth daily.   Yes Historical Provider, MD  cloNIDine (CATAPRES) 0.1 MG tablet Take 0.1 mg by mouth as needed.   Yes Historical Provider, MD  diazepam (VALIUM) 5 MG tablet Take 1 tablet (5 mg total) by mouth every 12 (twelve) hours as  needed for anxiety. Patient taking differently: Take 5-10 mg by mouth every 12 (twelve) hours as needed for anxiety (TAKES 5MG  IN AM AND 10MG  IN PM, ONLY AS NEEDED FOR ANXIETY).  10/23/15  Yes Sharon SellerJessica K Eubanks, NP  docusate sodium 100 MG CAPS Take 200 mg by mouth 2 (two) times daily. 03/08/14  Yes Leroy SeaPrashant K Singh, MD  gabapentin (NEURONTIN) 300 MG capsule Take 300 mg by mouth 2 (two) times daily.   Yes Historical Provider, MD  guaifenesin (ROBITUSSIN) 100 MG/5ML syrup Take 200 mg by mouth every 4 (four) hours as needed for cough.   Yes Historical Provider, MD  hydrochlorothiazide (HYDRODIURIL) 25 MG tablet Take 25 mg by mouth daily.   Yes Historical Provider, MD  insulin lispro (HUMALOG) 100 UNIT/ML injection Inject 0-8 Units into the skin See admin instructions. Sliding Scale - Inject before each meal and at bed time   Yes Historical Provider, MD  insulin NPH-regular Human (NOVOLIN 70/30) (70-30) 100 UNIT/ML injection Inject 25 Units into the skin 2 (two) times daily with a meal. Patient taking differently: Inject 35 Units into the skin 2 (two) times daily with a meal.  03/11/14  Yes Leroy SeaPrashant K Singh, MD  levothyroxine (SYNTHROID, LEVOTHROID) 125 MCG tablet Take 250 mcg by mouth daily before breakfast.   Yes Historical Provider, MD  lubiprostone (AMITIZA) 24 MCG capsule Take 24 mcg by mouth 2 (two) times daily with a meal.   Yes Historical Provider, MD  methadone (DOLOPHINE) 10 MG tablet Take three tablets by mouth three times daily for pain Patient taking differently: Take 30 mg by mouth every 8 (eight) hours. Take three tablets by mouth three times daily for pain 10/15/15  Yes Kirt BoysMonica Carter, DO  nitrofurantoin (MACRODANTIN) 50 MG capsule Take 50 mg by mouth at bedtime.   Yes Historical Provider, MD  nitroGLYCERIN (NITROSTAT) 0.4 MG SL tablet Place 0.4 mg under the tongue every 5 (five) minutes as needed for chest pain.   Yes Historical Provider, MD  omeprazole (PRILOSEC) 20 MG capsule Take 20 mg by  mouth daily.   Yes Historical Provider, MD  oxybutynin (DITROPAN) 5 MG tablet Take 5 mg by mouth 2 (two) times daily.    Yes Historical Provider, MD  Oxycodone HCl 10 MG TABS Take one tablet by mouth every 4 hours as needed for pain Patient taking differently: Take 10 mg by mouth every 4 (four) hours as needed (FOR PAIN). Take one tablet by mouth every 4 hours as needed for pain 10/09/15  Yes Sharon SellerJessica K Eubanks, NP  phenazopyridine (PYRIDIUM) 200 MG tablet Take 200 mg by mouth 3 (three) times daily as needed for pain.   Yes Historical Provider, MD  polyethylene glycol (MIRALAX / GLYCOLAX) packet Take 17 g by mouth daily.   Yes Historical Provider, MD  Polyvinyl Alcohol-Povidone (FRESHKOTE) 2.7-2 % SOLN Place 1 drop into both eyes 2 (two) times daily.   Yes Historical Provider, MD  potassium chloride (MICRO-K)  10 MEQ CR capsule Take 10 mEq by mouth daily.   Yes Historical Provider, MD  sucralfate (CARAFATE) 1 G tablet Take 1 g by mouth 4 (four) times daily -  with meals and at bedtime.   Yes Historical Provider, MD  tizanidine (ZANAFLEX) 2 MG capsule Take 2 mg by mouth daily. Takes IN DAYTIME for muscle spasms   Yes Historical Provider, MD  tiZANidine (ZANAFLEX) 4 MG capsule Take 4 mg by mouth at bedtime.    Yes Historical Provider, MD  traZODone (DESYREL) 100 MG tablet Take 100 mg by mouth at bedtime.   Yes Historical Provider, MD  venlafaxine XR (EFFEXOR-XR) 75 MG 24 hr capsule Take 75 mg by mouth daily with breakfast.   Yes Historical Provider, MD  Vitamin D, Ergocalciferol, (DRISDOL) 50000 UNITS CAPS capsule Take 50,000 Units by mouth every 14 (fourteen) days.   Yes Historical Provider, MD  diazepam (VALIUM) 10 MG tablet Take one tablet by mouth every night at bedtime as needed for anxiety 03/12/14   Oneal Grout, MD  diazepam (VALIUM) 2 MG tablet Take 1 tablet (2 mg total) by mouth 2 (two) times daily as needed for anxiety (and personal care). 03/08/14   Leroy Sea, MD  diazepam (VALIUM) 2 MG  tablet Take one tablet by mouth twice daily as needed for anxiety and personal care 04/08/14   Kermit Balo, DO  methadone (DOLOPHINE) 5 MG tablet Take three tablets by mouth daily at 8am and 11pm; Take two and 1/2 tablet by mouth once daily at 2pm 04/08/14   Tiffany L Reed, DO  oxyCODONE (ROXICODONE) 5 MG immediate release tablet Take two tablets by mouth every 8 hours as needed for severe pain; Take one tablet by mouth every four hours as needed for pain 03/11/14   Tiffany L Reed, DO  polyethylene glycol (MIRALAX / GLYCOLAX) packet Take 17 g by mouth 2 (two) times daily. 03/08/14   Leroy Sea, MD    Physical Exam: Filed Vitals:   10/27/15 1738 10/27/15 1923 10/27/15 2132 10/27/15 2138  BP: 127/97 131/64  139/62  Pulse: 82 75  79  Temp:    99.3 F (37.4 C)  TempSrc:    Oral  Resp: 19 24  18   Height:   5\' 6"  (1.676 m)   Weight:   140.5 kg (309 lb 11.9 oz)   SpO2: 98% 100%  99%     General:  Obese not in distress.  Eyes: Anicteric. No pallor.  ENT: No discharge from the ears eyes nose and mouth. Left parotid swelling.  Neck: No mass felt. No JVD appreciated.  Cardiovascular: S1 and S2 heard.  Respiratory: No rhonchi or crepitations.  Abdomen: Obese nontender bowel sounds present.  Skin: No rash.  Musculoskeletal: No edema.  Psychiatric: Appears normal.  Neurologic: Alert awake oriented to time place and person. Moves all extremities.  Labs on Admission:  Basic Metabolic Panel:  Recent Labs Lab 10/27/15 1734  NA 139  K 4.1  CL 98*  CO2 30  GLUCOSE 195*  BUN 8  CREATININE 0.77  CALCIUM 8.6*   Liver Function Tests:  Recent Labs Lab 10/27/15 1734  AST 16  ALT 13*  ALKPHOS 65  BILITOT 0.2*  PROT 6.4*  ALBUMIN 3.1*   No results for input(s): LIPASE, AMYLASE in the last 168 hours. No results for input(s): AMMONIA in the last 168 hours. CBC:  Recent Labs Lab 10/27/15 1734  WBC 6.7  HGB 8.6*  HCT 30.1*  MCV 84.3  PLT 159   Cardiac  Enzymes: No results for input(s): CKTOTAL, CKMB, CKMBINDEX, TROPONINI in the last 168 hours.  BNP (last 3 results) No results for input(s): BNP in the last 8760 hours.  ProBNP (last 3 results) No results for input(s): PROBNP in the last 8760 hours.  CBG: No results for input(s): GLUCAP in the last 168 hours.  Radiological Exams on Admission: Dg Chest 2 View  10/27/2015  CLINICAL DATA:  Chest pain. EXAM: CHEST  2 VIEW COMPARISON:  02/22/2014. FINDINGS: Stable mildly enlarged cardiac silhouette. Interval linear density in the right lower lung zone. Otherwise, clear lungs. The interstitial markings are mildly prominent. No pleural fluid. Unremarkable bones. IMPRESSION: 1. Interval small amount of linear atelectasis or scarring in the right lower lung zone. 2. Stable cardiomegaly. 3. Mild chronic interstitial lung disease. Electronically Signed   By: Beckie Salts M.D.   On: 10/27/2015 18:06     Assessment/Plan Principal Problem:   Abdominal pain Active Problems:   Hypothyroidism   Essential hypertension, benign   UTI (lower urinary tract infection)   Diabetes mellitus type 2, controlled (HCC)   1. Abdominal pain most likely from UTI - CT abdomen is pending. Patient has been placed on ceftriaxone. Follow urine cultures. 2. Diabetes mellitus type 2 - controlled continue home medications. 3. Hypertension - on HCTZ and when necessary IV hydralazine. 4. OSA on BiPAP at bedtime. 5. Left parotid swelling  - will require outpatient ENT referral.  6.  hypothyroidism on Synthroid. 7. Chronic pain continue home medications. 8. Chronic anemia - follow CBC.  Patient at this time as concerns with going back to the facility. For which I have consulted physical therapy and social worker. I have reviewed patient's old charts and labs.   DVT Prophylaxis - Lovenox. Code Status:  DO NOT INTUBATE  Family Communication: Discussed with patient.   Disposition Plan: Admit for  observation.   Amarilys Lyles N. [facility] Pager [phone].  If 7PM-7AM, please contact night-coverage [email or web address] Password [email or web address] 10/28/2015, 12:44 AM

## 2015-10-28 NOTE — Evaluation (Signed)
Physical Therapy Evaluation Patient Details Name: [name] MRN: [id number] DOB: [birth year] Today's Date: 10/28/2015   History of Present Illness  Pt is a 54 y/o F admitted from SNF w/ c/o abdominal pain, likely from UTI.  Pt's PMH is significant for neuropathy, Lyme disease, morbid obesity, chronic pain, generalized weakness, fibromyalgia, anemia, COPD, foot surgery.  Clinical Impression  Pt admitted with above diagnosis. Pt currently with functional limitations due to the deficits listed below (see PT Problem List). [name] is from SNF where she feels she was not in an environment that allowed her to challenge herself physically to increased independence and is adamant about not returning to a SNF as she has been to 4 different facilities in the past. She is motivated to return to a home environment as soon as possible w/ goal of having HH aide to assist as needed.  At this time, currently not appropriate to d/c to home environment w/ safety risk to pt and [name]; however w/ intensive therapy anticipate that pt may be able to achieve appropriate level to eventually return to home environment w/ [facility] aide.  She currently requires min guard for bed mobility, mod assist for sit<>stand transfer, and max assist for side steps at bedside.  Pt will benefit from skilled PT to increase their independence and safety with mobility to allow discharge to the venue listed below.      Follow Up Recommendations CIR;Supervision/Assistance - 24 hour    Equipment Recommendations  Other (comment) (TBD)    Recommendations for Other Services OT consult;Rehab consult     Precautions / Restrictions Precautions Precautions: Fall Precaution Comments: Pt limited to stand pivot transfers only PTA, pt impulsive Restrictions Weight Bearing Restrictions: No      Mobility  Bed Mobility Overal bed mobility: Needs Assistance Bed Mobility: Supine to Sit;Sit to Supine     Supine to sit: Min guard;HOB  elevated Sit to supine: Min guard   General bed mobility comments: Close min guard for pt's safety and cues for technique as pt demonstrates increased time and difficulty w/ scooting to EOB  Transfers Overall transfer level: Needs assistance Equipment used: Rolling walker (2 wheeled) Transfers: Sit to/from Stand Sit to Stand: Mod assist         General transfer comment: Mod assist for safety during sit<>stand to assist pt in stabilizing as she demonstrates severe trunk flexion.  Pt refusing to stand upright as suggested 2/2 Bil UE pain and reports, "This is how I always do it".    Ambulation/Gait Ambulation/Gait assistance: Max assist Ambulation Distance (Feet): 2 Feet Assistive device: Rolling walker (2 wheeled) Gait Pattern/deviations: Shuffle;Antalgic;Trunk flexed;Staggering left;Staggering right   Gait velocity interpretation: <1.8 ft/sec, indicative of risk for recurrent falls General Gait Details: 2 side steps standing EOB w/ max assist to manage RW and to assist in preventing pt from stepping forward away from bed as she is impulsive in trying to do so. Pt continues to demonstrate trunk flexion and refuses to stand upright.    Stairs            Wheelchair Mobility    Modified Rankin (Stroke Patients Only)       Balance Overall balance assessment: Needs assistance (pt denies any falls in the past year) Sitting-balance support: Bilateral upper extremity supported;Feet supported Sitting balance-Leahy Scale: Fair Sitting balance - Comments: Close min guard 2/2 pt's impulsivity   Standing balance support: Bilateral upper extremity supported;During functional activity Standing balance-Leahy Scale: Zero (for safety)  Pertinent Vitals/Pain Pain Assessment: No/denies pain    Home Living Family/patient expects to be discharged to:: Unsure                 Additional Comments: Pt is from SNF but is refusing to return  to this facility and is adamant about going home instead of back to facility.    Prior Function Level of Independence: Needs assistance   Gait / Transfers Assistance Needed: PTA pt reports she was Ind w/ stand pivot [facility] or lift chair using RW for support  ADL's / Homemaking Assistance Needed: PTA pt reports she received assist for bathing, cleaning        Hand Dominance        Extremity/Trunk Assessment   Upper Extremity Assessment: Generalized weakness           Lower Extremity Assessment: Generalized weakness;RLE deficits/detail;LLE deficits/detail RLE Deficits / Details: grossly 3/5 strength LLE Deficits / Details: grossly 3/5 strength     Communication   Communication: No difficulties  Cognition Arousal/Alertness: Awake/alert Behavior During Therapy: Anxious;Impulsive Overall Cognitive Status: Within Functional Limits for tasks assessed Area of Impairment: Safety/judgement         Safety/Judgement: Decreased awareness of safety;Decreased awareness of deficits          General Comments General comments (skin integrity, edema, etc.): Pt is adamant about not returning to a SNF as she has been to 4 different facilities in the past.  She is adamant about [place] a [place] or [place] and to have an aide that would come 8am-8pm as she has done prior to her facility stays.  Pt is anxious about d/c dispo and continues to talk over therapist when therapist attempts to discuss w/ pt that [facility] will place pt and aide at risk in the event of a fall.  Pt in denial of inc risk for falls despite needing max assist for side steps at bed just performed.  Therapist understands frustrations but given pt's mobility level, pt is not appropriate to return home at this time.  Pt would benefit from intensive therapy as she is motivated to become as Ind as possible.    Exercises        Assessment/Plan    PT Assessment Patient  needs continued PT services  PT Diagnosis Difficulty walking;Abnormality of gait;Generalized weakness;Acute pain   PT Problem List Decreased strength;Decreased range of motion;Decreased activity tolerance;Decreased balance;Decreased mobility;Decreased knowledge of use of DME;Decreased safety awareness;Decreased knowledge of precautions;Cardiopulmonary status limiting activity;Impaired sensation;Obesity;Pain  PT Treatment Interventions DME instruction;Gait training;Functional mobility training;Therapeutic activities;Therapeutic exercise;Balance training;Neuromuscular re-education;Patient/family education;Cognitive remediation;Wheelchair mobility training   PT Goals (Current goals can be found in the Care Plan section) Acute Rehab PT Goals Patient Stated Goal: to get to home setting as soon as possible PT Goal Formulation: With patient Time For Goal Achievement: 11/18/15 Potential to Achieve Goals: Fair    Frequency Min 3X/week   Barriers to discharge        Co-evaluation               End of Session Equipment Utilized During Treatment: Oxygen Activity Tolerance: Patient limited by fatigue Patient left: in bed;with call bell/phone within reach;with bed alarm set Nurse Communication: Mobility status;Need for lift equipment;Precautions (lift equipment for nursing staff safety)    Functional Assessment Tool Used: Clinical Judgement Functional Limitation: Mobility: Walking and moving around Mobility: Walking and Moving Around Current Status ([id number]): At least 80 percent but  less than 100 percent impaired, limited or restricted Mobility: Walking and Moving Around Goal Status [phone]): At least 40 percent but less than 60 percent impaired, limited or restricted    Time: 1056-1130 PT Time Calculation (min) (ACUTE ONLY): 34 min   Charges:   PT Evaluation $Initial PT Evaluation Tier I: 1 Procedure PT Treatments $Therapeutic Activity: 8-22 mins   PT G Codes:   PT G-Codes **NOT FOR  INPATIENT CLASS** Functional Assessment Tool Used: Clinical Judgement Functional Limitation: Mobility: Walking and moving around Mobility: Walking and Moving Around Current Status (U0454): At least 80 percent but less than 100 percent impaired, limited or restricted Mobility: Walking and Moving Around Goal Status [phone]): At least 40 percent but less than 60 percent impaired, limited or restricted   Michail Jewels PT, DPT [phone] Pager: [phone] 10/28/2015, 12:21 PM

## 2015-10-29 DIAGNOSIS — F332 Major depressive disorder, recurrent severe without psychotic features: Secondary | ICD-10-CM | POA: Diagnosis not present

## 2015-10-29 DIAGNOSIS — R109 Unspecified abdominal pain: Secondary | ICD-10-CM | POA: Diagnosis not present

## 2015-10-29 LAB — IRON AND TIBC
Iron: 29 ug/dL (ref 28–170)
Saturation Ratios: 7 % — ABNORMAL LOW (ref 10.4–31.8)
TIBC: 395 ug/dL (ref 250–450)
UIBC: 366 ug/dL

## 2015-10-29 LAB — RETICULOCYTES
RBC.: 3.53 MIL/uL — AB (ref 3.87–5.11)
RETIC CT PCT: 2.5 % (ref 0.4–3.1)
Retic Count, Absolute: 88.3 10*3/uL (ref 19.0–186.0)

## 2015-10-29 LAB — FOLATE: FOLATE: 13.7 ng/mL (ref >5.9)

## 2015-10-29 LAB — FERRITIN: FERRITIN: 6 ng/mL — AB (ref 11–307)

## 2015-10-29 LAB — GLUCOSE, CAPILLARY
GLUCOSE-CAPILLARY: 170 mg/dL — AB (ref 65–99)
GLUCOSE-CAPILLARY: 104 mg/dL — AB (ref 65–99)
Glucose-Capillary: 201 mg/dL — ABNORMAL HIGH (ref 65–99)
Glucose-Capillary: 133 mg/dL — ABNORMAL HIGH (ref 65–99)

## 2015-10-29 LAB — TROPONIN I

## 2015-10-29 LAB — VITAMIN B12: Vitamin B-12: 440 pg/mL (ref 180–914)

## 2015-10-29 MED ORDER — SODIUM CHLORIDE 0.9 % IV SOLN
510.0000 mg | Freq: Once | INTRAVENOUS | Status: AC
  Administered 2015-10-29: 510 mg via INTRAVENOUS
  Filled 2015-10-29: qty 17

## 2015-10-29 MED ORDER — GI COCKTAIL ~~LOC~~
30.0000 mL | Freq: Once | ORAL | Status: AC
  Administered 2015-10-29: 30 mL via ORAL
  Filled 2015-10-29: qty 30

## 2015-10-29 MED ORDER — DEXTROSE 5 % IV SOLN
2.0000 g | Freq: Three times a day (TID) | INTRAVENOUS | Status: DC
  Administered 2015-10-29 – 2015-10-31 (×7): 2 g via INTRAVENOUS
  Filled 2015-10-29 (×10): qty 2

## 2015-10-29 NOTE — Progress Notes (Addendum)
PROGRESS NOTE  [name] ZOX:[id number] DOB: January 06, 1961 DOA: 10/27/2015 PCP: Terald Sleeper, MD  HPI: [name] is a 54 y.o. female history of chronic abdominal pain, chronic pain syndrome, OSA, hypertension, hypothyroidism was brought to the ER from the nursing home facility after patient was complaining of abdominal pain  Subjective / 24 H Interval events - multiple complaints this morning, some going as far as 2 years back  Assessment/Plan: Principal Problem:   MDD (major depressive disorder), recurrent episode, severe (HCC) Active Problems:   Hypothyroidism   Essential hypertension, benign   UTI (lower urinary tract infection)   Abdominal pain   Diabetes mellitus type 2, controlled (HCC)    Abdominal pain most likely from UTI  - CT abdomen with mild colitis in the sigmoid, she has no diarrhea or lower GI symptms - prior history of Pseudomonas UTI, stop Ceftriaxone, change to [place] Diabetes mellitus type 2 - controlled continue home medications. Hypertension - on HCTZ and when necessary IV hydralazine. OSA on BiPAP at bedtime. Left parotid swelling - will require outpatient ENT referral.  Hypothyroidism on Synthroid. Chronic pain continue home medications. Chronic anemia  - follow CBC. Patient demanding a transfusion. Unless Hb gets to 7 will not transfuse - patient demanding IV iron. Will check iron panel   Diet: Diet regular Room service appropriate?: Yes; Fluid consistency:: Thin Fluids: none DVT Prophylaxis: Lovenox  Code Status: Partial Code Family Communication: no family bedside  Disposition Plan: D/C once urine cultures resultes  Barriers to discharge: urine cultures pending. History of Pseudomonas  Consultants:  None   Procedures:  None    Antibiotics Ceftriaxone 10/31 >> 11/2 Elita Quick 11/2 >>   Studies  Dg Chest 2 View  10/27/2015  CLINICAL DATA:  Chest pain. EXAM: CHEST  2 VIEW COMPARISON:  02/22/2014. FINDINGS:  Stable mildly enlarged cardiac silhouette. Interval linear density in the right lower lung zone. Otherwise, clear lungs. The interstitial markings are mildly prominent. No pleural fluid. Unremarkable bones. IMPRESSION: 1. Interval small amount of linear atelectasis or scarring in the right lower lung zone. 2. Stable cardiomegaly. 3. Mild chronic interstitial lung disease. Electronically Signed   By: Beckie Salts M.D.   On: 10/27/2015 18:06   Ct Abdomen Pelvis W Contrast  10/28/2015  CLINICAL DATA:  Abdominal pain and distension EXAM: CT ABDOMEN AND PELVIS WITH CONTRAST TECHNIQUE: Multidetector CT imaging of the abdomen and pelvis was performed using the standard protocol following bolus administration of intravenous contrast. CONTRAST:  OMNIPAQUE IOHEXOL 300 MG/ML  SOLN COMPARISON:  02/22/2014 FINDINGS: Lung bases are well aerated with the exception of minimal right basilar infiltrate. No sizable effusion is noted. The liver, gallbladder, spleen, adrenal glands pancreas are within normal limits. Kidneys are well visualized bilaterally within normal enhancement pattern. No renal calculi or urinary tract obstructive changes are noted. The ureters are within normal limits bilaterally. A suprapubic catheter decompresses the bladder. Significant laxity of the anterior abdominal wall is noted but stable from the prior exam. Aortoiliac calcifications are noted without aneurysmal dilatation. Mild inflammatory changes are noted within the colonic wall in the sigmoid colon. No significant diverticulitis is seen no perforation is noted. No pelvic mass lesion or lymphadenopathy is noted. IMPRESSION: New right basilar infiltrate. Mild thickening of the colonic wall within the sigmoid consistent with a degree of colitis. No perforation is seen. Chronic changes within the abdominal cavity without acute abnormality. Electronically Signed   By: Alcide Clever M.D.   On: 10/28/2015 02:06  Objective  Filed Vitals:    10/28/15 2055 10/29/15 0612 10/29/15 0655 10/29/15 1338  BP: 117/52 72/57 110/45 103/64  Pulse: 78 70 70 72  Temp: 100 F (37.8 C) 99.4 F (37.4 C)  98.5 F (36.9 C)  TempSrc: Oral Oral  Oral  Resp: 20 18  22   Height:      Weight:  141.8 kg (312 lb 9.8 oz)    SpO2: 99% 98%  99%    Intake/Output Summary (Last 24 hours) at 10/29/15 1417 Last data filed at 10/29/15 1347  Gross per 24 hour  Intake   1498 ml  Output   4500 ml  Net  -3002 ml   Filed Weights   10/27/15 2132 10/28/15 0535 10/29/15 0612  Weight: 140.5 kg (309 lb 11.9 oz) 141.4 kg (311 lb 11.7 oz) 141.8 kg (312 lb 9.8 oz)    Exam:  GENERAL: NAD  HEENT: head NCAT, no scleral icterus.   NECK: Supple.   LUNGS: Clear to auscultation. No wheezing or crackles  HEART: Regular rate and rhythm without murmur. 2+ pulses, no JVD, no peripheral edema  ABDOMEN: Soft, non-distended, non-tender. Positive bowel sounds. Reducible ventral hernia  EXTREMITIES: Without any cyanosis or clubbing. Good muscle tone  NEUROLOGIC: Alert and oriented x3. Non focal.   Data Reviewed: Basic Metabolic Panel:  Recent Labs Lab 10/27/15 1734 10/28/15 0545  NA 139 136  K 4.1 3.6  CL 98* 97*  CO2 30 32  GLUCOSE 195* 113*  BUN 8 7  CREATININE 0.77 0.91  CALCIUM 8.6* 8.3*   Liver Function Tests:  Recent Labs Lab 10/27/15 1734  AST 16  ALT 13*  ALKPHOS 65  BILITOT 0.2*  PROT 6.4*  ALBUMIN 3.1*   CBC:  Recent Labs Lab 10/27/15 1734 10/28/15 0545  WBC 6.7 5.6  HGB 8.6* 8.4*  HCT 30.1* 28.4*  MCV 84.3 84.3  PLT 159 166   CBG:  Recent Labs Lab 10/28/15 1201 10/28/15 1656 10/28/15 2149 10/29/15 0804 10/29/15 1207  GLUCAP 145* 85 106* 170* 104*    Recent Results (from the past 240 hour(s))  MRSA PCR Screening     Status: Abnormal   Collection Time: 10/28/15  5:17 AM  Result Value Ref Range Status   MRSA by PCR POSITIVE (A) NEGATIVE Final    Comment:        The GeneXpert MRSA Assay (FDA approved for  NASAL specimens only), is one component of a comprehensive MRSA colonization surveillance program. It is not intended to diagnose MRSA infection nor to guide or monitor treatment for MRSA infections. RESULT CALLED TO, READ BACK BY AND VERIFIED WITH: K. BRUNAGIN RN BY A. DAVIS 10/28/15 AT 0800   Urine culture     Status: None (Preliminary result)   Collection Time: 10/28/15  3:10 PM  Result Value Ref Range Status   Specimen Description URINE, SUPRAPUBIC  Final   Special Requests NONE  Final   Culture TOO YOUNG TO READ  Final   Report Status PENDING  Incomplete     Scheduled Meds: . bisacodyl  5 mg Oral QODAY  . buPROPion  300 mg Oral Daily  . cefTAZidime (FORTAZ)  IV  2 g Intravenous Q8H  . diazepam  5 mg Oral Q12H  . docusate sodium  200 mg Oral BID  . enoxaparin (LOVENOX) injection  40 mg Subcutaneous Q24H  . gabapentin  300 mg Oral TID  . hydrochlorothiazide  25 mg Oral Daily  . insulin aspart  0-9 Units  Subcutaneous TID WC  . insulin aspart protamine- aspart  35 Units Subcutaneous BID WC  . levothyroxine  250 mcg Oral QAC breakfast  . lubiprostone  24 mcg Oral BID WC  . methadone  30 mg Oral 3 times per day  . oxybutynin  5 mg Oral BID  . pantoprazole  40 mg Oral Daily  . PARoxetine  25 mg Oral Daily  . polyethylene glycol  17 g Oral Daily  . potassium chloride  10 mEq Oral Daily  . sodium chloride  3 mL Intravenous Q12H  . sucralfate  1 g Oral TID WC & HS  . tiZANidine  2 mg Oral Daily  . tiZANidine  4 mg Oral QHS  . traZODone  100 mg Oral QHS  . [START ON 11/04/2015] Vitamin D (Ergocalciferol)  50,000 Units Oral Q14 Days   Continuous Infusions:   Time spent: 50 minutes, more than 35 minutes bedside discussing with patient and answering her questions  Pamella Pert, MD [facility] Pager [phone]. If 7 PM - 7 AM, please contact night-coverage at [email or web address], password [facility] 10/29/2015, 2:17 PM

## 2015-10-29 NOTE — Progress Notes (Signed)
ANTIBIOTIC CONSULT NOTE - INITIAL  Pharmacy Consult for Ceftazidime Indication: UTI with prior pseudomonas  Allergies  Allergen Reactions  . Ibuprofen Other (See Comments) and Shortness Of Breath  . Sulfa Antibiotics Other (See Comments), Itching and Shortness Of Breath  . Levofloxacin Nausea Only    Other fluoroquinolones OK.  . Lisinopril Other (See Comments)  . Penicillins Other (See Comments) and Hives    Patient Measurements: Height:  (167.6 cm) Weight: (!) 312 lb 9.8 oz (141.8 kg) IBW/kg (Calculated) : 59.3 Adjusted Body Weight:   Vital Signs: Temp: 99.4 F (37.4 C) (11/02 0612) Temp Source: Oral (11/02 0612) BP: 110/45 mmHg (11/02 0655) Pulse Rate: 70 (11/02 0655) Intake/Output from previous day: 11/01 0701 - 11/02 0700 In: 794 [P.O.:744; IV Piggyback:50] Out: 2650 [Urine:2650] Intake/Output from this shift: Total I/O In: 300 [P.O.:300] Out: 1400 [Urine:1400]  Labs:  Recent Labs  10/27/15 1734 10/28/15 0545  WBC 6.7 5.6  HGB 8.6* 8.4*  PLT 159 166  CREATININE 0.77 0.91   Estimated Creatinine Clearance: 103 mL/min (by C-G formula based on Cr of 0.91). No results for input(s): VANCOTROUGH, VANCOPEAK, VANCORANDOM, GENTTROUGH, GENTPEAK, GENTRANDOM, TOBRATROUGH, TOBRAPEAK, TOBRARND, AMIKACINPEAK, AMIKACINTROU, AMIKACIN in the last 72 hours.   Microbiology: Recent Results (from the past 720 hour(s))  MRSA PCR Screening     Status: Abnormal   Collection Time: 10/28/15  5:17 AM  Result Value Ref Range Status   MRSA by PCR POSITIVE (A) NEGATIVE Final    Comment:        The GeneXpert MRSA Assay (FDA approved for NASAL specimens only), is one component of a comprehensive MRSA colonization surveillance program. It is not intended to diagnose MRSA infection nor to guide or monitor treatment for MRSA infections. RESULT CALLED TO, READ BACK BY AND VERIFIED WITH: K. BRUNAGIN RN BY A. DAVIS 10/28/15 AT 0800   Urine culture     Status: None (Preliminary  result)   Collection Time: 10/28/15  3:10 PM  Result Value Ref Range Status   Specimen Description URINE, SUPRAPUBIC  Final   Special Requests NONE  Final   Culture TOO YOUNG TO READ  Final   Report Status PENDING  Incomplete    Medical History: Past Medical History  Diagnosis Date  . Diabetes mellitus without complication (HCC)   . Chronic pain   . Morbid obesity (HCC)   . OSA (obstructive sleep apnea)   . Generalized weakness   . Fibromyalgia   . Lyme disease   . Anemia   . History of blood transfusion     pt states she has had 5 blood transfusions  . Sleep disorder   . UTI (lower urinary tract infection) 02/22/2014  . Hypertension   . Hypothyroidism   . COPD (chronic obstructive pulmonary disease) (HCC)   . Shortness of breath   . GERD (gastroesophageal reflux disease)     Medications:  Prescriptions prior to admission  Medication Sig Dispense Refill Last Dose  . acetaminophen (TYLENOL) 325 MG tablet Take 650 mg by mouth every 4 (four) hours as needed for mild pain.   Past Week at Unknown time  . benzocaine (ORAJEL) 10 % mucosal gel Use as directed 1 application in the mouth or throat 3 (three) times daily as needed for mouth pain.   Past Month at Unknown time  . bisacodyl (DULCOLAX) 5 MG EC tablet Take 2 tablets (10 mg total) by mouth daily. (Patient taking differently: Take 5 mg by mouth every other day. ) 30  tablet 0 10/26/2015 at Unknown time  . buPROPion (WELLBUTRIN XL) 300 MG 24 hr tablet Take 300 mg by mouth daily.   10/27/2015 at Unknown time  . cloNIDine (CATAPRES) 0.1 MG tablet Take 0.1 mg by mouth as needed.   PRN  . diazepam (VALIUM) 5 MG tablet Take 1 tablet (5 mg total) by mouth every 12 (twelve) hours as needed for anxiety. (Patient taking differently: Take 5-10 mg by mouth every 12 (twelve) hours as needed for anxiety (TAKES 5MG  IN AM AND 10MG  IN PM, ONLY AS NEEDED FOR ANXIETY). ) 60 tablet 5 Past Week at Unknown time  . docusate sodium 100 MG CAPS Take 200  mg by mouth 2 (two) times daily. 10 capsule 0 10/27/2015 at Unknown time  . gabapentin (NEURONTIN) 300 MG capsule Take 300 mg by mouth 2 (two) times daily.   10/27/2015 at Unknown time  . guaifenesin (ROBITUSSIN) 100 MG/5ML syrup Take 200 mg by mouth every 4 (four) hours as needed for cough.   Past Month at Unknown time  . hydrochlorothiazide (HYDRODIURIL) 25 MG tablet Take 25 mg by mouth daily.   10/27/2015 at Unknown time  . insulin lispro (HUMALOG) 100 UNIT/ML injection Inject 0-8 Units into the skin See admin instructions. Sliding Scale - Inject before each meal and at bed time   10/27/2015 at Unknown time  . insulin NPH-regular Human (NOVOLIN 70/30) (70-30) 100 UNIT/ML injection Inject 25 Units into the skin 2 (two) times daily with a meal. (Patient taking differently: Inject 35 Units into the skin 2 (two) times daily with a meal. ) 10 mL 11 10/27/2015 at Unknown time  . levothyroxine (SYNTHROID, LEVOTHROID) 125 MCG tablet Take 250 mcg by mouth daily before breakfast.   10/27/2015 at Unknown time  . lubiprostone (AMITIZA) 24 MCG capsule Take 24 mcg by mouth 2 (two) times daily with a meal.   10/27/2015 at Unknown time  . methadone (DOLOPHINE) 10 MG tablet Take three tablets by mouth three times daily for pain (Patient taking differently: Take 30 mg by mouth every 8 (eight) hours. Take three tablets by mouth three times daily for pain) 270 tablet 0 10/27/2015 at Unknown time  . nitrofurantoin (MACRODANTIN) 50 MG capsule Take 50 mg by mouth at bedtime.   10/26/2015 at Unknown time  . nitroGLYCERIN (NITROSTAT) 0.4 MG SL tablet Place 0.4 mg under the tongue every 5 (five) minutes as needed for chest pain.   PRN  . omeprazole (PRILOSEC) 20 MG capsule Take 20 mg by mouth daily.   10/27/2015 at Unknown time  . oxybutynin (DITROPAN) 5 MG tablet Take 5 mg by mouth 2 (two) times daily.    10/27/2015 at Unknown time  . Oxycodone HCl 10 MG TABS Take one tablet by mouth every 4 hours as needed for pain (Patient  taking differently: Take 10 mg by mouth every 4 (four) hours as needed (FOR PAIN). Take one tablet by mouth every 4 hours as needed for pain) 180 tablet 0 10/27/2015 at Unknown time  . phenazopyridine (PYRIDIUM) 200 MG tablet Take 200 mg by mouth 3 (three) times daily as needed for pain.   Past Month at Unknown time  . polyethylene glycol (MIRALAX / GLYCOLAX) packet Take 17 g by mouth daily.   10/27/2015 at Unknown time  . Polyvinyl Alcohol-Povidone (FRESHKOTE) 2.7-2 % SOLN Place 1 drop into both eyes 2 (two) times daily.   10/27/2015 at Unknown time  . potassium chloride (MICRO-K) 10 MEQ CR capsule Take 10 mEq by  mouth daily.   10/27/2015 at Unknown time  . sucralfate (CARAFATE) 1 G tablet Take 1 g by mouth 4 (four) times daily -  with meals and at bedtime.   10/27/2015 at Unknown time  . tizanidine (ZANAFLEX) 2 MG capsule Take 2 mg by mouth daily. Takes IN DAYTIME for muscle spasms   10/27/2015 at Unknown time  . tiZANidine (ZANAFLEX) 4 MG capsule Take 4 mg by mouth at bedtime.    10/26/2015 at Unknown time  . traZODone (DESYREL) 100 MG tablet Take 100 mg by mouth at bedtime.   10/26/2015 at Unknown time  . venlafaxine XR (EFFEXOR-XR) 75 MG 24 hr capsule Take 75 mg by mouth daily with breakfast.   10/27/2015 at Unknown time  . Vitamin D, Ergocalciferol, (DRISDOL) 50000 UNITS CAPS capsule Take 50,000 Units by mouth every 14 (fourteen) days.   10/21/2015  . diazepam (VALIUM) 10 MG tablet Take one tablet by mouth every night at bedtime as needed for anxiety 30 tablet 5   . diazepam (VALIUM) 2 MG tablet Take 1 tablet (2 mg total) by mouth 2 (two) times daily as needed for anxiety (and personal care). 10 tablet 0   . diazepam (VALIUM) 2 MG tablet Take one tablet by mouth twice daily as needed for anxiety and personal care 60 tablet 5   . methadone (DOLOPHINE) 5 MG tablet Take three tablets by mouth daily at 8am and 11pm; Take two and 1/2 tablet by mouth once daily at 2pm 255 tablet 0   . oxyCODONE  (ROXICODONE) 5 MG immediate release tablet Take two tablets by mouth every 8 hours as needed for severe pain; Take one tablet by mouth every four hours as needed for pain 360 tablet 0   . polyethylene glycol (MIRALAX / GLYCOLAX) packet Take 17 g by mouth 2 (two) times daily. 14 each 0    Scheduled:  . bisacodyl  5 mg Oral QODAY  . buPROPion  300 mg Oral Daily  . diazepam  5 mg Oral Q12H  . docusate sodium  200 mg Oral BID  . enoxaparin (LOVENOX) injection  40 mg Subcutaneous Q24H  . gabapentin  300 mg Oral TID  . hydrochlorothiazide  25 mg Oral Daily  . insulin aspart  0-9 Units Subcutaneous TID WC  . insulin aspart protamine- aspart  35 Units Subcutaneous BID WC  . levothyroxine  250 mcg Oral QAC breakfast  . lubiprostone  24 mcg Oral BID WC  . methadone  30 mg Oral 3 times per day  . oxybutynin  5 mg Oral BID  . pantoprazole  40 mg Oral Daily  . PARoxetine  25 mg Oral Daily  . polyethylene glycol  17 g Oral Daily  . potassium chloride  10 mEq Oral Daily  . sodium chloride  3 mL Intravenous Q12H  . sucralfate  1 g Oral TID WC & HS  . tiZANidine  2 mg Oral Daily  . tiZANidine  4 mg Oral QHS  . traZODone  100 mg Oral QHS  . [START ON 11/04/2015] Vitamin D (Ergocalciferol)  50,000 Units Oral Q14 Days   Infusions:     Assessment: 54yo female with history of DM, morbid obesity, anemia, UTI with pseudomonas, COPD, HTN, hypothyroid and GERD presents with abdominal discomfort. Pharmacy is consulted to dose ceftazidime for UTI with prior pseudomonas. Pt was started on ceftriaxone yesterday for UTI. Tmax 99.4, WBC wnl, sCr 0.91.  Goal of Therapy:  Eradication of infection  Plan:  Ceftaizidime 2g  IV q8h Follow up culture results, renal function and clinical course  Arlean Hopping. Newman Pies, PharmD Clinical Pharmacist Pager [phone] 10/29/2015,11:10 AM

## 2015-10-29 NOTE — Progress Notes (Signed)
Pt. Has been encouraged to turn/reposition multiple times throughout my shift. Also has been educated about preventing bed sores. Pt. Refuses to turn/repostion and often times remains supine in bed. Will continue to encourage patient to reposition.

## 2015-10-29 NOTE — Care Management Note (Signed)
Case Management Note  Patient Details  Name: Karen BarrioChristy Walls MRN: [id number] Date of Birth: 03/02/1961  Subjective/Objective:                  Patient from [facility]. Patient is wheelchair bound, oxygen dependent, and has a suprapubic cath. Patient admitted with UTI, on IV Abx, awaiting cultures/ sensitivities. Patient evaluated by psych. Patient initially wanting to be dc'd to home, however patient does not have a home, or support. CM asked patient what her plan was for discharge and she stated "I will be willing to go back [facility], if I can get my room back." Lupita LeashDonna CSW reported to CM that she is in the process of finding placement for patient.    Action/Plan:  No CM needs at this time, expect DC to SNF when medically stable.  Expected Discharge Date:                  Expected Discharge Plan:  Skilled Nursing Facility  In-House Referral:  Clinical Social Work  Discharge planning Services  CM Consult  Post Acute Care Choice:    Choice offered to:     DME Arranged:    DME Agency:     HH Arranged:    HH Agency:     Status of Service:  In process, will continue to follow  Medicare Important Message Given:    Date Medicare IM Given:    Medicare IM give by:    Date Additional Medicare IM Given:    Additional Medicare Important Message give by:     If discussed at Long Length of Stay Meetings, dates discussed:    Additional Comments:  Lawerance SabalDebbie Nicie Milan, RN 10/29/2015, 11:52 AM

## 2015-10-29 NOTE — Progress Notes (Signed)
Patient states that she will place CPAP on herself when she is ready to go to sleep. RT ensured it was filled with sterile water and will continue to monitor.

## 2015-10-29 NOTE — Progress Notes (Signed)
Pt complained of chest pain 8/10 radiating to her back, arms and jaw. EKG completed. Paged oncall NP and made aware. Will complete orders placed and continue to monitor.
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
## 2015-10-30 DIAGNOSIS — E118 Type 2 diabetes mellitus with unspecified complications: Secondary | ICD-10-CM | POA: Diagnosis not present

## 2015-10-30 DIAGNOSIS — Z87891 Personal history of nicotine dependence: Secondary | ICD-10-CM | POA: Diagnosis not present

## 2015-10-30 DIAGNOSIS — N390 Urinary tract infection, site not specified: Principal | ICD-10-CM

## 2015-10-30 DIAGNOSIS — F332 Major depressive disorder, recurrent severe without psychotic features: Secondary | ICD-10-CM | POA: Diagnosis present

## 2015-10-30 DIAGNOSIS — E039 Hypothyroidism, unspecified: Secondary | ICD-10-CM | POA: Diagnosis present

## 2015-10-30 DIAGNOSIS — Z794 Long term (current) use of insulin: Secondary | ICD-10-CM | POA: Diagnosis not present

## 2015-10-30 DIAGNOSIS — I10 Essential (primary) hypertension: Secondary | ICD-10-CM

## 2015-10-30 DIAGNOSIS — M797 Fibromyalgia: Secondary | ICD-10-CM | POA: Diagnosis present

## 2015-10-30 DIAGNOSIS — J449 Chronic obstructive pulmonary disease, unspecified: Secondary | ICD-10-CM | POA: Diagnosis present

## 2015-10-30 DIAGNOSIS — F419 Anxiety disorder, unspecified: Secondary | ICD-10-CM | POA: Diagnosis present

## 2015-10-30 DIAGNOSIS — Z79899 Other long term (current) drug therapy: Secondary | ICD-10-CM | POA: Diagnosis not present

## 2015-10-30 DIAGNOSIS — G4733 Obstructive sleep apnea (adult) (pediatric): Secondary | ICD-10-CM | POA: Diagnosis present

## 2015-10-30 DIAGNOSIS — Z9119 Patient's noncompliance with other medical treatment and regimen: Secondary | ICD-10-CM | POA: Diagnosis not present

## 2015-10-30 DIAGNOSIS — R109 Unspecified abdominal pain: Secondary | ICD-10-CM | POA: Diagnosis present

## 2015-10-30 DIAGNOSIS — Z993 Dependence on wheelchair: Secondary | ICD-10-CM | POA: Diagnosis not present

## 2015-10-30 DIAGNOSIS — Z66 Do not resuscitate: Secondary | ICD-10-CM | POA: Diagnosis present

## 2015-10-30 DIAGNOSIS — G894 Chronic pain syndrome: Secondary | ICD-10-CM | POA: Diagnosis present

## 2015-10-30 DIAGNOSIS — D509 Iron deficiency anemia, unspecified: Secondary | ICD-10-CM | POA: Diagnosis present

## 2015-10-30 DIAGNOSIS — K219 Gastro-esophageal reflux disease without esophagitis: Secondary | ICD-10-CM | POA: Diagnosis present

## 2015-10-30 DIAGNOSIS — A6920 Lyme disease, unspecified: Secondary | ICD-10-CM | POA: Diagnosis present

## 2015-10-30 DIAGNOSIS — E1140 Type 2 diabetes mellitus with diabetic neuropathy, unspecified: Secondary | ICD-10-CM | POA: Diagnosis present

## 2015-10-30 LAB — BASIC METABOLIC PANEL
Anion gap: 9 (ref 5–15)
BUN: 8 mg/dL (ref 6–20)
CO2: 34 mmol/L — ABNORMAL HIGH (ref 22–32)
CREATININE: 0.97 mg/dL (ref 0.44–1.00)
Calcium: 9 mg/dL (ref 8.9–10.3)
Chloride: 99 mmol/L — ABNORMAL LOW (ref 101–111)
GFR calc Af Amer: >60 mL/min (ref >60)
GLUCOSE: 191 mg/dL — AB (ref 65–99)
Potassium: 3.8 mmol/L (ref 3.5–5.1)
SODIUM: 142 mmol/L (ref 135–145)

## 2015-10-30 LAB — GLUCOSE, CAPILLARY
GLUCOSE-CAPILLARY: 172 mg/dL — AB (ref 65–99)
GLUCOSE-CAPILLARY: 88 mg/dL (ref 65–99)
GLUCOSE-CAPILLARY: 224 mg/dL — AB (ref 65–99)
Glucose-Capillary: 198 mg/dL — ABNORMAL HIGH (ref 65–99)
Glucose-Capillary: 57 mg/dL — ABNORMAL LOW (ref 65–99)

## 2015-10-30 LAB — CBC
HCT: 29.8 % — ABNORMAL LOW (ref 36.0–46.0)
Hemoglobin: 8.7 g/dL — ABNORMAL LOW (ref 12.0–15.0)
MCH: 24.9 pg — AB (ref 26.0–34.0)
MCHC: 29.2 g/dL — AB (ref 30.0–36.0)
MCV: 85.4 fL (ref 78.0–100.0)
PLATELETS: 164 10*3/uL (ref 150–400)
RBC: 3.49 MIL/uL — ABNORMAL LOW (ref 3.87–5.11)
RDW: 15.6 % — ABNORMAL HIGH (ref 11.5–15.5)
WBC: 4.4 10*3/uL (ref 4.0–10.5)

## 2015-10-30 NOTE — Progress Notes (Signed)
Pt c/o shaking stating she thinks her blood sugar is low. CBG assessed. 57. Primary RN notified. Encouraged PO intake. Will continue to monitor. Hortencia ConradiGwaltney, Latise Dilley B

## 2015-10-30 NOTE — [place] FL2 (Signed)
[place] MEDICAID FL2 LEVEL OF CARE SCREENING TOOL     IDENTIFICATION  Patient Name: [name] Birthdate: [birth year] Sex: female Admission Date (Current Location): 10/27/2015  [place] and [place] Number: Haynes Bast [id number] Facility and Address:  [facility] [place], [address] [place], [place] [address]      Provider Number: [id number]  Attending Physician Name and Address:  Leatha Gilding, MD  Relative Name and Phone Number:       Current Level of Care: Hospital Recommended Level of Care: Skilled Nursing Facility Prior Approval Number:    Date Approved/Denied:   PASRR Number:    Discharge Plan: SNF    Current Diagnoses: Patient Active Problem List   Diagnosis Date Noted  . Abdominal pain 10/28/2015  . Diabetes mellitus type 2, controlled (HCC) 10/28/2015  . MDD (major depressive disorder), recurrent episode, severe (HCC) 10/28/2015  . UTI (lower urinary tract infection) 10/27/2015  . Essential hypertension, benign 09/01/2014  . Scalp lesion 02/23/2014  . 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 Inadequate material resources 02/23/2014  . DNR (do not resuscitate) 02/23/2014  . Urinary tract infection 02/22/2014  . COPD (chronic obstructive pulmonary disease) (HCC) 02/18/2014  . Anemia, iron deficiency 02/18/2014  . Unspecified hereditary and idiopathic peripheral neuropathy 10/19/2013  . Chronic pain syndrome 10/19/2013  . Morbid obesity (HCC) 10/19/2013  . Type II or unspecified type diabetes mellitus with peripheral circulatory disorders, uncontrolled(250.72) 10/19/2013  . Depression 10/19/2013  . Hypothyroidism 10/19/2013  . Unspecified constipation 10/19/2013    Orientation ACTIVITIES/SOCIAL BLADDER RESPIRATION    Self, Time, Situation, Place  Active Continent Normal  BEHAVIORAL SYMPTOMS/MOOD NEUROLOGICAL BOWEL NUTRITION STATUS      Continent Diet (regular)  PHYSICIAN VISITS COMMUNICATION OF NEEDS Height & Weight Skin  30  days Verbally  (167.6 cm) 312 lbs. Normal          AMBULATORY STATUS RESPIRATION    Assist extensive Normal      Personal Care Assistance Level of Assistance  Bathing, Feeding, Dressing Bathing Assistance: Limited assistance Feeding assistance: Independent Dressing Assistance: Limited assistance      Functional Limitations Info  Sight, Speech Sight Info: Adequate Hearing Info: Adequate Speech Info: Adequate       SPECIAL CARE FACTORS FREQUENCY  PT (By licensed PT)     PT Frequency: 5x             Additional Factors Info  Code Status, Allergies Code Status Info:  (Partial) Allergies Info: Ibuprofen, sulfa antibiotics, lisinopril, penecillian           Current Medications (10/30/2015): Current Facility-Administered Medications  Medication Dose Route Frequency Provider Last Rate Last Dose  . acetaminophen (TYLENOL) tablet 650 mg  650 mg Oral Q6H PRN Eduard Clos, MD   650 mg at 10/29/15 1746   Or  . acetaminophen (TYLENOL) suppository 650 mg  650 mg Rectal Q6H PRN Eduard Clos, MD      . benzocaine (ORAJEL) 10 % mucosal gel 1 application  1 application Mouth/Throat TID PRN Eduard Clos, MD      . bisacodyl (DULCOLAX) EC tablet 5 mg  5 mg Oral QODAY Eduard Clos, MD   5 mg at 10/28/15 1030  . buPROPion (WELLBUTRIN XL) 24 hr tablet 300 mg  300 mg Oral Daily Eduard Clos, MD   300 mg at 10/29/15 1039  . cefTAZidime (FORTAZ) 2 g in dextrose 5 % 50 mL IVPB  2 g Intravenous Q8H Arlean Hopping  Ball, RPH   2 g at 10/30/15 0457  . diazepam (VALIUM) tablet 5 mg  5 mg Oral Q12H Leata MouseJanardhana Jonnalagadda, MD   5 mg at 10/29/15 2156  . docusate sodium (COLACE) capsule 200 mg  200 mg Oral BID Eduard ClosArshad N Kakrakandy, MD   200 mg at 10/29/15 2156  . enoxaparin (LOVENOX) injection 40 mg  40 mg Subcutaneous Q24H Eduard ClosArshad N Kakrakandy, MD   40 mg at 10/29/15 1039  . gabapentin (NEURONTIN) capsule 300 mg  300 mg Oral TID Leata MouseJanardhana Jonnalagadda, MD   300 mg  at 10/29/15 2156  . guaifenesin (ROBITUSSIN) 100 MG/5ML syrup 200 mg  200 mg Oral Q4H PRN Eduard ClosArshad N Kakrakandy, MD      . hydrochlorothiazide (HYDRODIURIL) tablet 25 mg  25 mg Oral Daily Eduard ClosArshad N Kakrakandy, MD   25 mg at 10/29/15 1038  . insulin aspart (novoLOG) injection 0-9 Units  0-9 Units Subcutaneous TID WC Eduard ClosArshad N Kakrakandy, MD   3 Units at 10/29/15 1746  . insulin aspart protamine- aspart (NOVOLOG MIX 70/30) injection 35 Units  35 Units Subcutaneous BID WC Eduard ClosArshad N Kakrakandy, MD   35 Units at 10/29/15 [phone]  . iohexol (OMNIPAQUE) 300 MG/ML solution 100 mL  100 mL Intravenous Once PRN Medication Radiologist, MD   100 mL at 10/28/15 0127  . levothyroxine (SYNTHROID, LEVOTHROID) tablet 250 mcg  250 mcg Oral QAC breakfast Eduard ClosArshad N Kakrakandy, MD   250 mcg at 10/29/15 0825  . lubiprostone (AMITIZA) capsule 24 mcg  24 mcg Oral BID WC Eduard ClosArshad N Kakrakandy, MD   24 mcg at 10/29/15 1746  . methadone (DOLOPHINE) tablet 30 mg  30 mg Oral 3 times per day Eduard ClosArshad N Kakrakandy, MD   30 mg at 10/30/15 0547  . nitroGLYCERIN (NITROSTAT) SL tablet 0.4 mg  0.4 mg Sublingual Q5 min PRN Eduard ClosArshad N Kakrakandy, MD      . ondansetron [facility](ZOFRAN) tablet 4 mg  4 mg Oral Q6H PRN Eduard ClosArshad N Kakrakandy, MD       Or  . ondansetron [facility](ZOFRAN) injection 4 mg  4 mg Intravenous Q6H PRN Eduard ClosArshad N Kakrakandy, MD      . oxybutynin (DITROPAN) tablet 5 mg  5 mg Oral BID Eduard ClosArshad N Kakrakandy, MD   5 mg at 10/29/15 2156  . oxyCODONE (Oxy IR/ROXICODONE) immediate release tablet 10 mg  10 mg Oral Q4H PRN Leatha Gildingostin M Gherghe, MD   10 mg at 10/29/15 1250  . pantoprazole (PROTONIX) EC tablet 40 mg  40 mg Oral Daily Eduard ClosArshad N Kakrakandy, MD   40 mg at 10/29/15 1038  . PARoxetine (PAXIL-CR) 24 hr tablet 25 mg  25 mg Oral Daily Leata MouseJanardhana Jonnalagadda, MD   25 mg at 10/29/15 1039  . polyethylene glycol (MIRALAX / GLYCOLAX) packet 17 g  17 g Oral Daily Eduard ClosArshad N Kakrakandy, MD   17 g at 10/29/15 1248  . potassium chloride (K-DUR) CR tablet 10 mEq  10 mEq  Oral Daily Eduard ClosArshad N Kakrakandy, MD   10 mEq at 10/29/15 1038  . sodium chloride 0.9 % injection 3 mL  3 mL Intravenous Q12H Eduard ClosArshad N Kakrakandy, MD   3 mL at 10/29/15 2156  . sucralfate (CARAFATE) tablet 1 g  1 g Oral TID WC & HS Eduard ClosArshad N Kakrakandy, MD   1 g at 10/29/15 2155  . tiZANidine (ZANAFLEX) tablet 2 mg  2 mg Oral Daily Eduard ClosArshad N Kakrakandy, MD   2 mg at 10/29/15 1038  . tiZANidine (ZANAFLEX) tablet 4 mg  4 mg Oral QHS Eduard Clos, MD   4 mg at 10/29/15 2155  . traZODone (DESYREL) tablet 100 mg  100 mg Oral QHS Eduard Clos, MD   100 mg at 10/29/15 2156  . [START ON 11/04/2015] Vitamin D (Ergocalciferol) (DRISDOL) capsule 50,000 Units  50,000 Units Oral Q14 Days Eduard Clos, MD       Do not use this list as official medication orders. Please verify with discharge summary.  Discharge Medications:   Medication List    ASK your doctor about these medications        acetaminophen 325 MG tablet  Commonly known as:  TYLENOL  Take 650 mg by mouth every 4 (four) hours as needed for mild pain.     benzocaine 10 % mucosal gel  Commonly known as:  ORAJEL  Use as directed 1 application in the mouth or throat 3 (three) times daily as needed for mouth pain.     bisacodyl 5 MG EC tablet  Commonly known as:  DULCOLAX  Take 2 tablets (10 mg total) by mouth daily.     buPROPion 300 MG 24 hr tablet  Commonly known as:  WELLBUTRIN XL  Take 300 mg by mouth daily.     cloNIDine 0.1 MG tablet  Commonly known as:  CATAPRES  Take 0.1 mg by mouth as needed.     diazepam 2 MG tablet  Commonly known as:  VALIUM  Take 1 tablet (2 mg total) by mouth 2 (two) times daily as needed for anxiety (and personal care).     diazepam 10 MG tablet  Commonly known as:  VALIUM  Take one tablet by mouth every night at bedtime as needed for anxiety     diazepam 2 MG tablet  Commonly known as:  VALIUM  Take one tablet by mouth twice daily as needed for anxiety and personal care      diazepam 5 MG tablet  Commonly known as:  VALIUM  Take 1 tablet (5 mg total) by mouth every 12 (twelve) hours as needed for anxiety.     DSS 100 MG Caps  Take 200 mg by mouth 2 (two) times daily.     FRESHKOTE 2.7-2 % Soln  Generic drug:  Polyvinyl Alcohol-Povidone  Place 1 drop into both eyes 2 (two) times daily.     gabapentin 300 MG capsule  Commonly known as:  NEURONTIN  Take 300 mg by mouth 2 (two) times daily.     guaifenesin 100 MG/5ML syrup  Commonly known as:  ROBITUSSIN  Take 200 mg by mouth every 4 (four) hours as needed for cough.     hydrochlorothiazide 25 MG tablet  Commonly known as:  HYDRODIURIL  Take 25 mg by mouth daily.     insulin lispro 100 UNIT/ML injection  Commonly known as:  HUMALOG  Inject 0-8 Units into the skin See admin instructions. Sliding Scale - Inject before each meal and at bed time     insulin NPH-regular Human (70-30) 100 UNIT/ML injection  Commonly known as:  NOVOLIN 70/30  Inject 25 Units into the skin 2 (two) times daily with a meal.     levothyroxine 125 MCG tablet  Commonly known as:  SYNTHROID, LEVOTHROID  Take 250 mcg by mouth daily before breakfast.     lubiprostone 24 MCG capsule  Commonly known as:  AMITIZA  Take 24 mcg by mouth 2 (two) times daily with a meal.     methadone 5 MG tablet  Commonly known as:  DOLOPHINE  Take three tablets by mouth daily at 8am and 11pm; Take two and 1/2 tablet by mouth once daily at 2pm     methadone 10 MG tablet  Commonly known as:  DOLOPHINE  Take three tablets by mouth three times daily for pain     nitrofurantoin 50 MG capsule  Commonly known as:  MACRODANTIN  Take 50 mg by mouth at bedtime.     nitroGLYCERIN 0.4 MG SL tablet  Commonly known as:  NITROSTAT  Place 0.4 mg under the tongue every 5 (five) minutes as needed for chest pain.     omeprazole 20 MG capsule  Commonly known as:  PRILOSEC  Take 20 mg by mouth daily.     oxybutynin 5 MG tablet  Commonly known as:  DITROPAN   Take 5 mg by mouth 2 (two) times daily.     oxyCODONE 5 MG immediate release tablet  Commonly known as:  ROXICODONE  Take two tablets by mouth every 8 hours as needed for severe pain; Take one tablet by mouth every four hours as needed for pain     Oxycodone HCl 10 MG Tabs  Take one tablet by mouth every 4 hours as needed for pain     phenazopyridine 200 MG tablet  Commonly known as:  PYRIDIUM  Take 200 mg by mouth 3 (three) times daily as needed for pain.     polyethylene glycol packet  Commonly known as:  MIRALAX / GLYCOLAX  Take 17 g by mouth daily.     polyethylene glycol packet  Commonly known as:  MIRALAX / GLYCOLAX  Take 17 g by mouth 2 (two) times daily.     potassium chloride 10 MEQ CR capsule  Commonly known as:  MICRO-K  Take 10 mEq by mouth daily.     sucralfate 1 G tablet  Commonly known as:  CARAFATE  Take 1 g by mouth 4 (four) times daily -  with meals and at bedtime.     tizanidine 2 MG capsule  Commonly known as:  ZANAFLEX  Take 2 mg by mouth daily. Takes IN DAYTIME for muscle spasms     tiZANidine 4 MG capsule  Commonly known as:  ZANAFLEX  Take 4 mg by mouth at bedtime.     traZODone 100 MG tablet  Commonly known as:  DESYREL  Take 100 mg by mouth at bedtime.     venlafaxine XR 75 MG 24 hr capsule  Commonly known as:  EFFEXOR-XR  Take 75 mg by mouth daily with breakfast.     Vitamin D (Ergocalciferol) 50000 UNITS Caps capsule  Commonly known as:  DRISDOL  Take 50,000 Units by mouth every 14 (fourteen) days.        Relevant Imaging Results:  Relevant Lab Results:  Recent Labs    Additional Information Pt is on insulin  [employer]  BSW intern  [phone]

## 2015-10-30 NOTE — Progress Notes (Signed)
[facility] PROGRESS NOTE  Karen BarrioChristy Candee ZOX:[id number] DOB: 10/09/1961 DOA: 10/27/2015 PCP: Terald SleeperOBSON,MICHAEL GAVIN, MD  HPI [name] is a 54 yo female with a history of chronic abdominal pain, chronic pain syndrome, OSA, hypertension, hypothyroidism who presented to the ER on 10/31 from a nursing home facility with complaint of abdominal pain. Since admission the patient has had multiple complaint related to her chronic pain.   Subjective - Complains of chest pain, for 6 months - she is insisting that she will need at least 4 weeks of IV antibiotics. Continues to be demanding and with pressured speech regarding her medical course.  Assessment/Plan: UTI - patient presented to the ER on 10/31 with complaint of abdominal pain. Prior history of Pseudomonas UTI. Urine shows bacteria, nitrite positive, and large leukocytes. Patient also has had a suprapubic catheter for multiple years. CT abdomen with mild colitis in the sigmoid. No diarrhea or lower GI symptoms, abdominal pain improved today. Urine cultures pending. Continue on Fortaz. Major depressive disorder:  - In house psychiatry evaluation. Positive for anxiety and depression, does not meet criteria for inpatient psychiatric admission. Continue on Welbutrin, paxil and effexor. Chest pain:  - patient describes chronic, intermittent mild burning chest pain radiating into the arm, neck and back with no associated shortness of breath, nausea, palpitations, or vomiting. Cardiac vs GERD vs anxiety. She says that in the past the pain has been relieved by antacids but has not used them recently. Patient felt the pain last night and EKG at that time was negative for ST changes and unchanged since last EKG. Troponin negative X 2.  - given risk factors might benefit from stress test. Discussed with cardiology, will set up as an outpatient Diabetes mellitus type 2: controlled, continue home medications Hypertension:  On HZTZ, add IV  hydralazin for systolic >160. Obstructive sleep apnea on Bipap:  Continue at bedtime Hypothyroidism: continue on Sythroid. Left parotid swelling: CT maxillofacial showed nonspecific findings involving both parotids with symmetric bilateral lymph node involvement. Possibility of bilateral parotid nonspecific inflammation not excluded.   Not appreciated on physical exam, outpatient ENT referal. Chronic pain:continue on home medications. Chronic iron deficiency anemia: follow CBC. Patient currently asymptomatic. Patient demanding transfusion. If Hb reaches 7 will consider  transfusing. Received iron infusion on 11/2 for iron deficiency anemia   DVT Prophylaxis: Lovenox Code Status: DNI Family Communication: none at bedside Disposition Plan: DC to SNF once urine cultures result   Consultants:  none  Procedures:  none  Antibiotics: Ceftriaxone 10/31 >> 11/2 [name] 11/2 >>  Objective: Filed Vitals:   10/30/15 0547  BP: 104/72  Pulse: 74  Temp: 98 F (36.7 C)  Resp: 18    Intake/Output Summary (Last 24 hours) at 10/30/15 1159 Last data filed at 10/30/15 0457  Gross per 24 hour  Intake    804 ml  Output   4650 ml  Net  -3846 ml   Filed Weights   10/28/15 0535 10/29/15 0612 10/30/15 0500  Weight: 141.4 kg (311 lb 11.7 oz) 141.8 kg (312 lb 9.8 oz) 140.2 kg (309 lb 1.4 oz)    Exam:  GENERAL: Well-developed, well-nourished, in no acute distress  HEENT: head NCAT, no scleral icterus.   NECK: Supple. No masses or lesions.  LUNGS: Clear to auscultation. No wheezing or crackles  HEART: Regular rate and rhythm without murmur. 2+ pulses, no JVD, no peripheral edema  ABDOMEN: Soft, non-distended, non-tender. Positive bowel sounds. Reducible ventral hernia  EXTREMITIES: Without any cyanosis or  clubbing. Good muscle tone  NEUROLOGIC: Alert and oriented x3. Non focal.  PSYCHIATRIC: Positive for anxiety, pressured speech and perseveration.  Basic Metabolic  Panel:  Recent Labs Lab 10/27/15 1734 10/28/15 0545 10/30/15 0557  NA 139 136 142  K 4.1 3.6 3.8  CL 98* 97* 99*  CO2 30 32 34*  GLUCOSE 195* 113* 191*  BUN CREATININE 0.77 0.91 0.97  CALCIUM 8.6* 8.3* 9.0   Liver Function Tests:  Recent Labs Lab 10/27/15 1734  AST 16  ALT 13*  ALKPHOS 65  BILITOT 0.2*  PROT 6.4*  ALBUMIN 3.1*   CBC:  Recent Labs Lab 10/27/15 1734 10/28/15 0545 10/30/15 0557  WBC 6.7 5.6 4.4  HGB 8.6* 8.4* 8.7*  HCT 30.1* 28.4* 29.8*  MCV 84.3 84.3 85.4  PLT 159 166 164   Cardiac Enzymes:  Recent Labs Lab 10/29/15 1430  TROPONINI <0.03   CBG:  Recent Labs Lab 10/29/15 1207 10/29/15 1717 10/29/15 2239 10/30/15 0754 10/30/15 1134  GLUCAP 104* 201* 133* 172* 198*    Recent Results (from the past 240 hour(s))  MRSA PCR Screening     Status: Abnormal   Collection Time: 10/28/15  5:17 AM  Result Value Ref Range Status   MRSA by PCR POSITIVE (A) NEGATIVE Final    Comment:        The GeneXpert MRSA Assay (FDA approved for NASAL specimens only), is one component of a comprehensive MRSA colonization surveillance program. It is not intended to diagnose MRSA infection nor to guide or monitor treatment for MRSA infections. RESULT CALLED TO, READ BACK BY AND VERIFIED WITH: K. BRUNAGIN RN BY A. DAVIS 10/28/15 AT 0800   Urine culture     Status: None (Preliminary result)   Collection Time: 10/28/15  3:10 PM  Result Value Ref Range Status   Specimen Description URINE, SUPRAPUBIC  Final   Special Requests NONE  Final   Culture >=100,000 COLONIES/mL GRAM NEGATIVE RODS  Final   Report Status PENDING  Incomplete    Studies: No results found.  Scheduled Meds: . bisacodyl  5 mg Oral QODAY  . buPROPion  300 mg Oral Daily  . cefTAZidime (FORTAZ)  IV  2 g Intravenous Q8H  . diazepam  5 mg Oral Q12H  . docusate sodium  200 mg Oral BID  . enoxaparin (LOVENOX) injection  40 mg Subcutaneous Q24H  . gabapentin  300 mg Oral TID   . hydrochlorothiazide  25 mg Oral Daily  . insulin aspart  0-9 Units Subcutaneous TID WC  . insulin aspart protamine- aspart  35 Units Subcutaneous BID WC  . levothyroxine  250 mcg Oral QAC breakfast  . lubiprostone  24 mcg Oral BID WC  . methadone  30 mg Oral 3 times per day  . oxybutynin  5 mg Oral BID  . pantoprazole  40 mg Oral Daily  . PARoxetine  25 mg Oral Daily  . polyethylene glycol  17 g Oral Daily  . potassium chloride  10 mEq Oral Daily  . sodium chloride  3 mL Intravenous Q12H  . sucralfate  1 g Oral TID WC & HS  . tiZANidine  2 mg Oral Daily  . tiZANidine  4 mg Oral QHS  . traZODone  100 mg Oral QHS  . [START ON 11/04/2015] Vitamin D (Ergocalciferol)  50,000 Units Oral Q14 Days   Continuous Infusions:   Principal Problem:   MDD (major depressive disorder), recurrent episode, severe (HCC) Active Problems:  Hypothyroidism   Essential hypertension, benign   UTI (lower urinary tract infection)   Abdominal pain   Diabetes mellitus type 2, controlled (HCC) Abdominal pain Chest pain Chronic anemia   Time spent: 45 minutes, 35 minutes bedside discussing with patient and answering her multiple questions    Goddess Gebbia M. Elvera Lennox, MD [facility] [phone] [facility] If 7PM-7AM, please contact night-coverage at [email or web address], password [facility] 10/30/2015, 11:59 AM

## 2015-10-30 NOTE — Care Management Obs Status (Signed)
MEDICARE OBSERVATION STATUS NOTIFICATION   Patient Details  Name: [name] MRN: [id number] Date of Birth: 11/11/1961   Medicare Observation Status Notification Given:       Bernadette HoitShoffner, Rhythm Wigfall Coleman 10/30/2015, 2:21 PM

## 2015-10-30 NOTE — Care Management Obs Status (Signed)
MEDICARE OBSERVATION STATUS NOTIFICATION   Patient Details  Name: [name] MRN: [id number] Date of Birth: 03/10/1961   Medicare Observation Status Notification Given:  Yes    Bernadette HoitShoffner, Kriste Broman Coleman 10/30/2015, 2:23 PM

## 2015-10-30 NOTE — Progress Notes (Addendum)
Merdis DelayK. Schorr MD paged regarding patient's CBG of 224. K. Schorr MD aware, no order received at the moment.

## 2015-10-30 NOTE — Care Management Obs Status (Signed)
MEDICARE OBSERVATION STATUS NOTIFICATION   Patient Details  Name: Karen BarrioChristy Walls MRN: [id number] Date of Birth: 07/28/1961   Medicare Observation Status Notification Given:  Yes    Bernadette HoitShoffner, Mickayla Trouten Coleman 10/30/2015, 2:21 PM

## 2015-10-30 NOTE — Care Management Obs Status (Signed)
MEDICARE OBSERVATION STATUS NOTIFICATION   Patient Details  Name: Karen BarrioChristy Walls MRN: [id number] Date of Birth: 04/06/1961   Medicare Observation Status Notification Given:  Yes    Bernadette HoitShoffner, Bellanie Matthew Coleman 10/30/2015, 2:28 PM

## 2015-10-30 NOTE — Progress Notes (Signed)
Hypoglycemic Event  CBG: 57  Treatment: 15 GM carbohydrate snack  Symptoms: None  Follow-up CBG: Time:1840 CBG Result:88  Possible Reasons for Event: Unknown  Comments/MD notified:*insuklin given pt did  Not eat meal**    Jeronimo GreavesKelley J Carolyne Whitsel

## 2015-10-30 NOTE — Progress Notes (Signed)
Patient identified as high fall risk. [name] attempt to set bed alarm on, which patient refused, stated "I don't want bed alarm on because it went off every time I turn." RN came to patient's room, explained to patient the importance of bed alarm, patient verbalized understanding, still not want bed alarm on, stated she would call if she needs help to get out of bed.

## 2015-10-30 NOTE — Progress Notes (Signed)
CBG reassessed. 88. Will continue to monitor. Hortencia ConradiGwaltney, Altonio Schwertner B

## 2015-10-30 NOTE — Care Management Obs Status (Signed)
MEDICARE OBSERVATION STATUS NOTIFICATION   Patient Details  Name: Karen BarrioChristy Chiao MRN: [id number] Date of Birth: 12/25/1961   Medicare Observation Status Notification Given:  Yes    Bernadette HoitShoffner, Aeden Matranga Coleman 10/30/2015, 2:24 PM

## 2015-10-30 NOTE — Progress Notes (Signed)
Physical Therapy Treatment Patient Details Name: [name] MRN: [id number] DOB: 11-07-1961 Today's Date: 10/30/2015    History of Present Illness Pt is a 54 y/o F admitted from SNF w/ c/o abdominal pain, likely from UTI.  Pt's PMH is significant for neuropathy, Lyme disease, morbid obesity, chronic pain, generalized weakness, fibromyalgia, anemia, COPD, foot surgery.    PT Comments    Patient demonstrating mobility with very little help and only needing supervision for safety.  Feel once she is able to obtain housing in community and PCS aide, she should be safe to d/c home.  In the meantime agree with SNF level rehab to maximize safety and independence.  Follow Up Recommendations  SNF     Equipment Recommendations  Rolling walker with 5" wheels;Other (comment) (bariatric RW, slide board)    Recommendations for Other Services       Precautions / Restrictions Precautions Precautions: Fall    Mobility  Bed Mobility         Supine to sit: Modified independent (Device/Increase time);HOB elevated Sit to supine: Supervision   General bed mobility comments: increased time for sit to supine with HOB flat and using bed controls to lower HOB and trendelenberg in order to scoot to [place] used: Rolling walker (2 wheeled) Transfers: Sit to/from [facility] Sit to Stand: Supervision Stand pivot transfers: Supervision;Min guard       General transfer comment: assist for safety, pt pulls up on walker, reports LE's get shaky as mobilizing; walker used to stand and step to chair, then back to bed  Ambulation/Gait                 Stairs            Wheelchair Mobility    Modified Rankin (Stroke Patients Only)       Balance Overall balance assessment: Needs assistance   Sitting balance-Leahy Scale: Good     Standing balance support: Bilateral upper extremity supported Standing balance-Leahy Scale: Poor Standing  balance comment: UE support needed for balance no physical assist needed                    Cognition Arousal/Alertness: Awake/alert Behavior During Therapy: Anxious Overall Cognitive Status: No family/caregiver present to determine baseline cognitive functioning Area of Impairment: Memory     Memory: Decreased short-term memory   Safety/Judgement: Decreased awareness of safety          Exercises      General Comments General comments (skin integrity, edema, etc.): Patient continued to perseverate on social and psych issues throughout session and need for SW assist for muliple issues.  Encouraged to seek out resources through proper channels.      Pertinent Vitals/Pain Pain Assessment: No/denies pain    Home Living                      Prior Function            PT Goals (current goals can now be found in the care plan section) Progress towards PT goals: Goals met and updated - see care plan    Frequency  Min 2X/week    PT Plan Discharge plan needs to be updated    Co-evaluation             End of Session Equipment Utilized During Treatment: Oxygen Activity Tolerance: Patient tolerated treatment well Patient left: in bed;with call bell/phone within reach  Time: 6122-4001 PT Time Calculation (min) (ACUTE ONLY): 45 min  Charges:  $Therapeutic Activity: 23-37 mins $Self Care/Home Management: 8-22                    G Codes:      [name],Karen 11/06/2015, 2:19 PM [name], [place] 06-Nov-2015

## 2015-10-31 DIAGNOSIS — E118 Type 2 diabetes mellitus with unspecified complications: Secondary | ICD-10-CM

## 2015-10-31 DIAGNOSIS — F332 Major depressive disorder, recurrent severe without psychotic features: Secondary | ICD-10-CM

## 2015-10-31 LAB — GLUCOSE, CAPILLARY
GLUCOSE-CAPILLARY: 218 mg/dL — AB (ref 65–99)
Glucose-Capillary: 204 mg/dL — ABNORMAL HIGH (ref 65–99)
Glucose-Capillary: 206 mg/dL — ABNORMAL HIGH (ref 65–99)

## 2015-10-31 LAB — URINE CULTURE: Culture: >=100000

## 2015-10-31 MED ORDER — PAROXETINE HCL ER 25 MG PO TB24: 25.0000 mg | ORAL_TABLET | Freq: Every day | ORAL | Status: AC

## 2015-10-31 MED ORDER — CIPROFLOXACIN HCL 500 MG PO TABS: 500.0000 mg | ORAL_TABLET | Freq: Two times a day (BID) | ORAL | Status: DC

## 2015-10-31 MED ORDER — METHADONE HCL 10 MG PO TABS: ORAL_TABLET | ORAL | Status: DC

## 2015-10-31 MED ORDER — CEFPODOXIME PROXETIL 100 MG PO TABS: 100.0000 mg | ORAL_TABLET | Freq: Two times a day (BID) | ORAL | Status: DC

## 2015-10-31 MED ORDER — PAROXETINE HCL ER 37.5 MG PO TB24: 37.5000 mg | ORAL_TABLET | Freq: Every day | ORAL | Status: DC

## 2015-10-31 MED ORDER — DIAZEPAM 5 MG PO TABS: 5.0000 mg | ORAL_TABLET | Freq: Two times a day (BID) | ORAL | Status: DC | PRN

## 2015-10-31 MED ORDER — MUPIROCIN 2 % EX OINT
1.0000 "application " | TOPICAL_OINTMENT | Freq: Two times a day (BID) | CUTANEOUS | Status: DC
  Administered 2015-10-31: 1 via NASAL
  Filled 2015-10-31: qty 22

## 2015-10-31 MED ORDER — OXYCODONE HCL 10 MG PO TABS: ORAL_TABLET | ORAL | Status: DC

## 2015-10-31 MED ORDER — CHLORHEXIDINE GLUCONATE CLOTH 2 % EX PADS: 6.0000 | MEDICATED_PAD | Freq: Every day | CUTANEOUS | Status: DC
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
## 2015-10-31 NOTE — Progress Notes (Signed)
Patient not ready for CPAP at this time.

## 2015-10-31 NOTE — Progress Notes (Signed)
[name] to be D/C'd Skilled nursing facility per MD order.  Discussed with the patient and all questions fully answered.  VSS, Skin clean, dry and intact without evidence of skin break down, no evidence of skin tears noted. IV catheter discontinued intact. Site without signs and symptoms of complications. Dressing and pressure applied.  An After Visit Summary was printed and given to the patient. Patient received prescription.  D/c education completed with patient/family including follow up instructions, medication list, d/c activities limitations if indicated, with other d/c instructions as indicated by MD - patient able to verbalize understanding, all questions fully answered.   Patient instructed to return to ED, call 911, or call MD for any changes in condition.   Patient escorted via stretcher, and D/C via ambulance.  Otis DialsCassandra J Sharhonda Atwood 10/31/2015 5:53 PM

## 2015-10-31 NOTE — Progress Notes (Signed)
Pharmacist Provided - Patient Medication Education Prior to Discharge   Karen BarrioChristy Walls is an 54 y.o. female who [facility] with a chief complaint of  Chief Complaint  Patient presents with  . multiple complaints      [x]  Patient will be discharged with 2 new medications  The following medications were discussed with the patient: Cefpodoxime, Ciprofloxacin, Insulin  Antibiotics at discharge: [x]  Yes    []  No  Diabetes Medications: [x]  Yes    []  No  Medication Adherence Assessment: [x]  Excellent (no doses missed/week)      []  Good (1 dose missed/week)      []  Partial (2-3 doses missed/week)      []  Poor (>3 doses missed/week)  Barriers to Obtaining Medications: [x]  Yes []  No  [name] expressed concern with obtaining medication, she currently resides in a facility and is worried that she will not get any medications until Monday. She states that the facility does not get medications on the weekends and she thinks they will not have her medications if she is discharged today.   Assessment: [name] is a 54yo F admitted 10/27/2015 with abdominal pain found to have Pseudomonas/Providencia UTI. She will be treated with an additional 11 days (total treatment duration = 14 days) of cefpodoxime and ciprofloxacin. I explained to the patient that she will need both antibiotics as they are treating different organisms. She also uses insulin for management of her type 2 diabetes. She was able to verbalize appropriate hypoglycemia management. The patient stated that she does not have a blood glucose meter anymore but would like to have one at her facility. [name] would also like to have a print out of her medications at discharge. I assured her that she would receive this information in her AVS.  Time spent preparing for discharge counseling: 25 minutes Time spent counseling patient: 45 minutes  Casilda Carlsaylor Burke Terry, PharmD. Clinical Pharmacist Resident Pager:  [phone] 10/31/2015, 3:47 PM

## 2015-10-31 NOTE — Discharge Summary (Signed)
Physician Discharge Summary  [name] ZOX:[id number] DOB: 12/30/1960 DOA: 10/27/2015  PCP: Terald Sleeper, MD  Admit date: 10/27/2015 Discharge date: 10/31/2015  Time spent: > 45 minutes  Recommendations for Outpatient Follow-up:  1. Follow up with Dr. Leanord Hawking in 1-2 weeks 2. Continue Cefpodoxime and Ciprofloxacin for 11 additional days for UTI 3. Cardiology will contact patient for outpatient appointment for evaluation for stress test    Discharge Diagnoses:  Principal Problem:   MDD (major depressive disorder), recurrent episode, severe (HCC) Active Problems:   Hypothyroidism   Essential hypertension, benign   UTI (lower urinary tract infection)   Abdominal pain   Diabetes mellitus type 2, controlled (HCC)  Discharge Condition: stable  Diet recommendation: heart healthy  Filed Weights   10/29/15 0612 10/30/15 0500 10/31/15 0500  Weight: 141.8 kg (312 lb 9.8 oz) 140.2 kg (309 lb 1.4 oz) 140.5 kg (309 lb 11.9 oz)    History of present illness:  [name] is a 54 y.o. female history of chronic abdominal pain, chronic pain syndrome, OSA, hypertension, hypothyroidism was brought to the ER from the nursing home facility after patient was complaining of abdominal pain. Patient has chronic laxity of the abdominal wall. Patient denies any nausea vomiting or diarrhea. The patient's UA shows features concerning for UTI. CT abdomen is pending and patient has been admitted for further observation. Denies any chest pain or shortness of breath. Patient is wheelchair bound with previous history of neuropathy and Lyme disease.  Hospital Course:  UTI - patient presented to the ER on 10/31 with complaint of abdominal pain. Prior history of Pseudomonas UTI. Urine shows bacteria, nitrite positive, and large leukocytes. Patient also has had a suprapubic catheter for multiple years and I am not clear of details when it was placed. CT abdomen with mild colitis in the sigmoid but  without any other significant findings. Patient had no diarrhea or lower GI symptoms and her abdominal pain seems to be chronic without acute findings. She was initially treated with Elita Quick, Her urine cultures speciated Pseudomonas and Providencia, and based on sensitivities patient will be narrowed to Cefpodoxime and Ciprofloxacin for 11 additional days to complete a 14 day course.  Major depressive disorder:  - In house psychiatry evaluation. Positive for anxiety and depression, does not meet criteria for inpatient psychiatric admission. Continue on Welbutrin, paxil and effexor. Discontinue Effexor on d/c.  Generalized pain and chest pain - patient describes chronic, intermittent mild burning chest pain, back pain, ear pain, forearm and hand pain an no associated shortness of breath, nausea, palpitations, or vomiting. Her pain is relieved by anti-acids, however has not used them recently. Her pain characteristics change with each interview and she cannot provide a reliable consistent history. Her pain is very atypical and is reproducible with palpation. Patient endorses a stress test and a cath in the past (negative) and at that time her symptoms were very different. Her pain is chronic, atypical and this can be further worked up as an outpatient. I have contacted cardiology and will set up outpatient follow up. Diabetes mellitus type 2 - controlled, continue home medications Hypertension -On HZTZ, add IV hydralazin for systolic >160. Obstructive sleep apnea odn Bipap - Continue at bedtime Hypothyroidism - continue on Sythroid. Left parotid swelling - CT maxillofacial showed nonspecific findings involving both parotids with symmetric bilateral lymph node involvement. Possibility of bilateral parotid nonspecific inflammation not excluded. Not appreciated on physical exam, outpatient ENT referal. This is chronic. Chronic pain -continue on home medications.  Chronic iron deficiency anemia - follow CBC.  Patient currently asymptomatic. Patient demanding transfusion. If Hb reaches 7 will consider transfusing. Received iron infusion on 11/2 for iron deficiency anemia Non compliance - [name] medical history is complicated by repeated non compliance as documented in her prior medical history, d/c summaries and outpatient progress notes. She has continued to be non compliant with her care during this hospitalization as well.   Procedures:  None   Consultations:  Psychiatry   Discharge Exam: Filed Vitals:   10/30/15 2240 10/31/15 0339 10/31/15 0500 10/31/15 0538  BP:    118/91  Pulse:  81  73  Temp: 98.7 F (37.1 C)   98.6 F (37 C)  TempSrc: Oral   Oral  Resp:  18  20  Height:      Weight:   140.5 kg (309 lb 11.9 oz)   SpO2:  98%  96%   General: NAD Cardiovascular: RRr Respiratory: CTA biL  Discharge Instructions Activity:  As tolerated   Get Medicines reviewed and adjusted: Please take all your medications with you for your next visit with your Primary MD  Please request your Primary MD to go over all hospital tests and procedure/radiological results at the follow up, please ask your Primary MD to get all Hospital records sent to his/her office.  If you experience worsening of your admission symptoms, develop shortness of breath, life threatening emergency, suicidal or homicidal thoughts you must seek medical attention immediately by calling 911 or calling your MD immediately if symptoms less severe.  You must read complete instructions/literature along with all the possible adverse reactions/side effects for all the Medicines you take and that have been prescribed to you. Take any new Medicines after you have completely understood and accpet all the possible adverse reactions/side effects.   Do not drive when taking Pain medications.   Do not take more than prescribed Pain, Sleep and Anxiety Medications  Special Instructions: If you have smoked or chewed Tobacco in  the last 2 yrs please stop smoking, stop any regular Alcohol and or any Recreational drug use.  Wear Seat belts while driving.  Please note  You were cared for by a hospitalist during your hospital stay. Once you are discharged, your primary care physician will handle any further medical issues. Please note that NO REFILLS for any discharge medications will be authorized once you are discharged, as it is imperative that you return to your primary care physician (or establish a relationship with a primary care physician if you do not have one) for your aftercare needs so that they can reassess your need for medications and monitor your lab values.    Medication List    STOP taking these medications        nitrofurantoin 50 MG capsule  Commonly known as:  MACRODANTIN     venlafaxine XR 75 MG 24 hr capsule  Commonly known as:  EFFEXOR-XR      TAKE these medications        acetaminophen 325 MG tablet  Commonly known as:  TYLENOL  Take 650 mg by mouth every 4 (four) hours as needed for mild pain.     benzocaine 10 % mucosal gel  Commonly known as:  ORAJEL  Use as directed 1 application in the mouth or throat 3 (three) times daily as needed for mouth pain.     bisacodyl 5 MG EC tablet  Commonly known as:  DULCOLAX  Take 2 tablets (10 mg total) by mouth  daily.     buPROPion 300 MG 24 hr tablet  Commonly known as:  WELLBUTRIN XL  Take 300 mg by mouth daily.     cefpodoxime 100 MG tablet  Commonly known as:  VANTIN  Take 1 tablet (100 mg total) by mouth 2 (two) times daily. For 11 additional days     ciprofloxacin 500 MG tablet  Commonly known as:  CIPRO  Take 1 tablet (500 mg total) by mouth 2 (two) times daily. For 11 additional days     cloNIDine 0.1 MG tablet  Commonly known as:  CATAPRES  Take 0.1 mg by mouth as needed.     diazepam 5 MG tablet  Commonly known as:  VALIUM  Take 1-2 tablets (5-10 mg total) by mouth every 12 (twelve) hours as needed for anxiety (TAKES  5MG  IN AM AND 10MG  IN PM, ONLY AS NEEDED FOR ANXIETY).     DSS 100 MG Caps  Take 200 mg by mouth 2 (two) times daily.     FRESHKOTE 2.7-2 % Soln  Generic drug:  Polyvinyl Alcohol-Povidone  Place 1 drop into both eyes 2 (two) times daily.     gabapentin 300 MG capsule  Commonly known as:  NEURONTIN  Take 300 mg by mouth 2 (two) times daily.     guaifenesin 100 MG/5ML syrup  Commonly known as:  ROBITUSSIN  Take 200 mg by mouth every 4 (four) hours as needed for cough.     hydrochlorothiazide 25 MG tablet  Commonly known as:  HYDRODIURIL  Take 25 mg by mouth daily.     insulin lispro 100 UNIT/ML injection  Commonly known as:  HUMALOG  Inject 0-8 Units into the skin See admin instructions. Sliding Scale - Inject before each meal and at bed time     insulin NPH-regular Human (70-30) 100 UNIT/ML injection  Commonly known as:  NOVOLIN 70/30  Inject 25 Units into the skin 2 (two) times daily with a meal.     levothyroxine 125 MCG tablet  Commonly known as:  SYNTHROID, LEVOTHROID  Take 250 mcg by mouth daily before breakfast.     lubiprostone 24 MCG capsule  Commonly known as:  AMITIZA  Take 24 mcg by mouth 2 (two) times daily with a meal.     methadone 10 MG tablet  Commonly known as:  DOLOPHINE  Take three tablets by mouth three times daily for pain     nitroGLYCERIN 0.4 MG SL tablet  Commonly known as:  NITROSTAT  Place 0.4 mg under the tongue every 5 (five) minutes as needed for chest pain.     omeprazole 20 MG capsule  Commonly known as:  PRILOSEC  Take 20 mg by mouth daily.     oxybutynin 5 MG tablet  Commonly known as:  DITROPAN  Take 5 mg by mouth 2 (two) times daily.     Oxycodone HCl 10 MG Tabs  Take one tablet by mouth every 4 hours as needed for pain     PARoxetine 25 MG 24 hr tablet  Commonly known as:  PAXIL-CR  Take 1 tablet (25 mg total) by mouth daily.     phenazopyridine 200 MG tablet  Commonly known as:  PYRIDIUM  Take 200 mg by mouth 3 (three)  times daily as needed for pain.     polyethylene glycol packet  Commonly known as:  MIRALAX / GLYCOLAX  Take 17 g by mouth 2 (two) times daily.     potassium chloride 10 MEQ  CR capsule  Commonly known as:  MICRO-K  Take 10 mEq by mouth daily.     sucralfate 1 G tablet  Commonly known as:  CARAFATE  Take 1 g by mouth 4 (four) times daily -  with meals and at bedtime.     tiZANidine 4 MG capsule  Commonly known as:  ZANAFLEX  Take 4 mg by mouth at bedtime.     traZODone 100 MG tablet  Commonly known as:  DESYREL  Take 100 mg by mouth at bedtime.     Vitamin D (Ergocalciferol) 50000 UNITS Caps capsule  Commonly known as:  DRISDOL  Take 50,000 Units by mouth every 14 (fourteen) days.         The results of significant diagnostics from this hospitalization (including imaging, microbiology, ancillary and laboratory) are listed below for reference.    Significant Diagnostic Studies: Dg Chest 2 View  10/27/2015  CLINICAL DATA:  Chest pain. EXAM: CHEST  2 VIEW COMPARISON:  02/22/2014. FINDINGS: Stable mildly enlarged cardiac silhouette. Interval linear density in the right lower lung zone. Otherwise, clear lungs. The interstitial markings are mildly prominent. No pleural fluid. Unremarkable bones. IMPRESSION: 1. Interval small amount of linear atelectasis or scarring in the right lower lung zone. 2. Stable cardiomegaly. 3. Mild chronic interstitial lung disease. Electronically Signed   By: Beckie Salts M.D.   On: 10/27/2015 18:06   Ct Abdomen Pelvis W Contrast  10/28/2015  CLINICAL DATA:  Abdominal pain and distension EXAM: CT ABDOMEN AND PELVIS WITH CONTRAST TECHNIQUE: Multidetector CT imaging of the abdomen and pelvis was performed using the standard protocol following bolus administration of intravenous contrast. CONTRAST:  OMNIPAQUE IOHEXOL 300 MG/ML  SOLN COMPARISON:  02/22/2014 FINDINGS: Lung bases are well aerated with the exception of minimal right basilar infiltrate. No  sizable effusion is noted. The liver, gallbladder, spleen, adrenal glands pancreas are within normal limits. Kidneys are well visualized bilaterally within normal enhancement pattern. No renal calculi or urinary tract obstructive changes are noted. The ureters are within normal limits bilaterally. A suprapubic catheter decompresses the bladder. Significant laxity of the anterior abdominal wall is noted but stable from the prior exam. Aortoiliac calcifications are noted without aneurysmal dilatation. Mild inflammatory changes are noted within the colonic wall in the sigmoid colon. No significant diverticulitis is seen no perforation is noted. No pelvic mass lesion or lymphadenopathy is noted. IMPRESSION: New right basilar infiltrate. Mild thickening of the colonic wall within the sigmoid consistent with a degree of colitis. No perforation is seen. Chronic changes within the abdominal cavity without acute abnormality. Electronically Signed   By: Alcide Clever M.D.   On: 10/28/2015 02:06   Microbiology: Recent Results (from the past 240 hour(s))  MRSA PCR Screening     Status: Abnormal   Collection Time: 10/28/15  5:17 AM  Result Value Ref Range Status   MRSA by PCR POSITIVE (A) NEGATIVE Final    Comment:        The GeneXpert MRSA Assay (FDA approved for NASAL specimens only), is one component of a comprehensive MRSA colonization surveillance program. It is not intended to diagnose MRSA infection nor to guide or monitor treatment for MRSA infections. RESULT CALLED TO, READ BACK BY AND VERIFIED WITH: Jeani Sow RN BY A. DAVIS 10/28/15 AT 0800   Urine culture     Status: None   Collection Time: 10/28/15  3:10 PM  Result Value Ref Range Status   Specimen Description URINE, SUPRAPUBIC  Final  Special Requests NONE  Final   Culture   Final    >=100,000 COLONIES/mL PSEUDOMONAS AERUGINOSA 50,000 COLONIES/mL PROVIDENCIA STUARTII    Report Status 10/31/2015 FINAL  Final   Organism ID, Bacteria  PSEUDOMONAS AERUGINOSA  Final   Organism ID, Bacteria PROVIDENCIA STUARTII  Final      Susceptibility   Pseudomonas aeruginosa - MIC*    CEFTAZIDIME 4 SENSITIVE Sensitive     CIPROFLOXACIN <=0.25 SENSITIVE Sensitive     GENTAMICIN 4 SENSITIVE Sensitive     IMIPENEM 2 SENSITIVE Sensitive     PIP/TAZO 16 SENSITIVE Sensitive     CEFEPIME 2 SENSITIVE Sensitive     * >=100,000 COLONIES/mL PSEUDOMONAS AERUGINOSA   Providencia stuartii - MIC*    AMPICILLIN >=32 RESISTANT Resistant     CEFAZOLIN >=64 RESISTANT Resistant     CEFTRIAXONE <=1 SENSITIVE Sensitive     CIPROFLOXACIN >=4 RESISTANT Resistant     GENTAMICIN 2 RESISTANT Resistant     IMIPENEM 2 SENSITIVE Sensitive     NITROFURANTOIN >=512 RESISTANT Resistant     TRIMETH/SULFA >=320 RESISTANT Resistant     AMPICILLIN/SULBACTAM >=32 RESISTANT Resistant     PIP/TAZO <=4 SENSITIVE Sensitive     * 50,000 COLONIES/mL PROVIDENCIA STUARTII    Labs: Basic Metabolic Panel:  Recent Labs Lab 10/27/15 1734 10/28/15 0545 10/30/15 0557  NA 139 136 142  K 4.1 3.6 3.8  CL 98* 97* 99*  CO2 30 32 34*  GLUCOSE 195* 113* 191*  BUN 8 7 8   CREATININE 0.77 0.91 0.97  CALCIUM 8.6* 8.3* 9.0   Liver Function Tests:  Recent Labs Lab 10/27/15 1734  AST 16  ALT 13*  ALKPHOS 65  BILITOT 0.2*  PROT 6.4*  ALBUMIN 3.1*   CBC:  Recent Labs Lab 10/27/15 1734 10/28/15 0545 10/30/15 0557  WBC 6.7 5.6 4.4  HGB 8.6* 8.4* 8.7*  HCT 30.1* 28.4* 29.8*  MCV 84.3 84.3 85.4  PLT 159 166 164   Cardiac Enzymes:  Recent Labs Lab 10/29/15 1430  TROPONINI <0.03   CBG:  Recent Labs Lab 10/30/15 1819 10/30/15 1840 10/30/15 2151 10/31/15 0751 10/31/15 1209  GLUCAP 57* 88 224* 218* 204*    Signed:  GHERGHE, COSTIN  [facility] 10/31/2015, 1:47 PM

## 2015-10-31 NOTE — Progress Notes (Addendum)
Inpatient Diabetes Program Recommendations  AACE/ADA: New Consensus Statement on Inpatient Glycemic Control (2015)  Target Ranges:  Prepandial:   less than 140 mg/dL      Peak postprandial:   less than 180 mg/dL (1-2 hours)      Critically ill patients:  140 - 180 mg/dL    Results for [name], [name] (MRN [id number]) as of 10/31/2015 08:29  Ref. Range 10/30/2015 07:54 10/30/2015 11:34 10/30/2015 18:19 10/30/2015 18:40 10/30/2015 21:51  Glucose-Capillary Latest Ref Range: 65-99 mg/dL 147172 (H) 829198 (H) 57 (L) 88 224 (H)    Admit with: Abdominal Pain/ UTI  History: DM, COPD  Home DM Meds: 70/30 insulin- 35 units bidwc       Humalog 0-8 units tid per SSI  Current Insulin Orders: Novolog 70/30 Mix insulin- 35 units bidwc      Novolog Sensitive SSI (0-9 units) TID AC    -Note patient had an episode of Hypoglycemia yesterday afternoon.  -I suspect this low CBG was the result of the 70/30 insulin being given too late in the morning (was not given until 11am) and then patient was given a dose of Novolog (2 units) at 1pm.  -Note patient's CBG was elevated this AM.  -Do not recommend making any adjustments to 70/30 insulin at this point since insulin was given off schedule yesterday which likely led to Hypoglycemia.    --Will follow patient during hospitalization--  Ambrose FinlandJeannine Johnston Vlasta Baskin RN, MSN, CDE Diabetes Coordinator Inpatient Glycemic Control Team Team Pager: [phone] (8a-5p)

## 2015-10-31 NOTE — Progress Notes (Deleted)
[facility] PROGRESS NOTE  [name] EAV:[id number] DOB: [birth year] DOA: 10/27/2015 PCP: Terald Sleeper, MD  Assessment/Plan: UTI - patient presented to the ER on 10/31 with complaint of abdominal pain. Prior history of Pseudomonas UTI. Urine shows bacteria, nitrite positive, and large leukocytes. Patient also has had a suprapubic catheter for multiple years. CT abdomen with mild colitis in the sigmoid. No diarrhea or lower GI symptoms, abdominal pain improved today. Urine cultures show Pseudamonas aeruginosa and Providencia StuartII  . Continue on Fortaz, transition to oral cefpadoxin and ciprofloxacin for 14 days.  Major depressive disorder:  - In house psychiatry evaluation. Positive for anxiety and depression, does not meet criteria for inpatient psychiatric admission. Continue on Welbutrin, paxil and effexor.   Chest pain:  - patient describes chronic, intermittent mild burning chest pain radiating into the arm, neck and back with no associated shortness of breath, nausea, palpitations, or vomiting. Cardiac vs GERD vs anxiety. She says that in the past the pain has been relieved by antacids but has not used them recently. Patient felt the pain last night and EKG at that time was negative for ST changes and unchanged since last EKG. Troponin negative X 2.  - given risk factors might benefit from stress test. Discussed with cardiology, will set up as an outpatient  Diabetes mellitus type 2 -controlled, continue home medications  Hypertension -On HZTZ, add IV hydralazin for systolic >160.  Obstructive sleep apnea on Bipap -Continue at bedtime  Hypothyroidism - continue on Sythroid.  Left parotid swelling - CT maxillofacial showed nonspecific findings involving both parotids with symmetric bilateral lymph node involvement. Possibility of bilateral parotid nonspecific inflammation not excluded. Not appreciated on physical exam, outpatient ENT referal.  Chronic  pain -continue on home medications.  Chronic iron deficiency anemia - follow CBC. Patient currently asymptomatic. Patient demanding transfusion. If Hb reaches 7 will consider transfusing. Received iron infusion on 11/2 for iron deficiency anemia    Code Status: DNI Family Communication:none at bedside Disposition Plan: discharge when facility is available   Consultants:  none  Procedures:  none  Antibiotics: See below  HPI [name] is a 54 yo female with a history of chronic abdominal pain, chronic pain syndrome, OSA, hypertension, hypothyroidism who presented to the ER on 10/31 from a nursing home facility with complaint of abdominal pain. Since admission the patient has had multiple complaints related to her chronic pain.   Subjective: - Complains of fatigue related to her multiple medical conditions - she is insisting that she will need at least 4 weeks of IV antibiotics. Continues to be demanding and with pressured speech regarding her medical course.  Objective: Filed Vitals:   10/31/15 0538  BP: 118/91  Pulse: 73  Temp: 98.6 F (37 C)  Resp: 20    Intake/Output Summary (Last 24 hours) at 10/31/15 1128 Last data filed at 10/31/15 1123  Gross per 24 hour  Intake   1800 ml  Output   4250 ml  Net  -2450 ml   Filed Weights   10/29/15 0612 10/30/15 0500 10/31/15 0500  Weight: 141.8 kg (312 lb 9.8 oz) 140.2 kg (309 lb 1.4 oz) 140.5 kg (309 lb 11.9 oz)    Exam:  GENERAL: Well-developed, well-nourished, in no acute distress  HEENT: head NCAT, no scleral icterus.   NECK: Supple. No masses or lesions.  LUNGS: Clear to auscultation. No wheezing or crackles  HEART: Regular rate and rhythm without murmur. 2+ pulses, no JVD, no peripheral edema  ABDOMEN: Soft,  non-distended, non-tender. Positive bowel sounds. Reducible ventral hernia  EXTREMITIES: Without any cyanosis or clubbing. Good muscle tone  NEUROLOGIC: Alert and oriented x3. Non  focal.  PSYCHIATRIC: Positive for anxiety, pressured speech and perseveration.    Data Reviewed: Basic Metabolic Panel:  Recent Labs Lab 10/27/15 1734 10/28/15 0545 10/30/15 0557  NA 139 136 142  K 4.1 3.6 3.8  CL 98* 97* 99*  CO2 30 32 34*  GLUCOSE 195* 113* 191*  BUN CREATININE 0.77 0.91 0.97  CALCIUM 8.6* 8.3* 9.0   Liver Function Tests:  Recent Labs Lab 10/27/15 1734  AST 16  ALT 13*  ALKPHOS 65  BILITOT 0.2*  PROT 6.4*  ALBUMIN 3.1*   No results for input(s): LIPASE, AMYLASE in the last 168 hours. No results for input(s): AMMONIA in the last 168 hours. CBC:  Recent Labs Lab 10/27/15 1734 10/28/15 0545 10/30/15 0557  WBC 6.7 5.6 4.4  HGB 8.6* 8.4* 8.7*  HCT 30.1* 28.4* 29.8*  MCV 84.3 84.3 85.4  PLT 159 166 164   Cardiac Enzymes:  Recent Labs Lab 10/29/15 1430  TROPONINI <0.03   BNP (last 3 results) No results for input(s): BNP in the last 8760 hours.  ProBNP (last 3 results) No results for input(s): PROBNP in the last 8760 hours.  CBG:  Recent Labs Lab 10/30/15 1134 10/30/15 1819 10/30/15 1840 10/30/15 2151 10/31/15 0751  GLUCAP 198* 57* 88 224* 218*    Recent Results (from the past 240 hour(s))  MRSA PCR Screening     Status: Abnormal   Collection Time: 10/28/15  5:17 AM  Result Value Ref Range Status   MRSA by PCR POSITIVE (A) NEGATIVE Final    Comment:        The GeneXpert MRSA Assay (FDA approved for NASAL specimens only), is one component of a comprehensive MRSA colonization surveillance program. It is not intended to diagnose MRSA infection nor to guide or monitor treatment for MRSA infections. RESULT CALLED TO, READ BACK BY AND VERIFIED WITH: Jeani Sow RN BY A. DAVIS 10/28/15 AT 0800   Urine culture     Status: None   Collection Time: 10/28/15  3:10 PM  Result Value Ref Range Status   Specimen Description URINE, SUPRAPUBIC  Final   Special Requests NONE  Final   Culture   Final    >=100,000  COLONIES/mL PSEUDOMONAS AERUGINOSA 50,000 COLONIES/mL PROVIDENCIA STUARTII    Report Status 10/31/2015 FINAL  Final   Organism ID, Bacteria PSEUDOMONAS AERUGINOSA  Final   Organism ID, Bacteria PROVIDENCIA STUARTII  Final      Susceptibility   Pseudomonas aeruginosa - MIC*    CEFTAZIDIME 4 SENSITIVE Sensitive     CIPROFLOXACIN <=0.25 SENSITIVE Sensitive     GENTAMICIN 4 SENSITIVE Sensitive     IMIPENEM 2 SENSITIVE Sensitive     PIP/TAZO 16 SENSITIVE Sensitive     CEFEPIME 2 SENSITIVE Sensitive     * >=100,000 COLONIES/mL PSEUDOMONAS AERUGINOSA   Providencia stuartii - MIC*    AMPICILLIN >=32 RESISTANT Resistant     CEFAZOLIN >=64 RESISTANT Resistant     CEFTRIAXONE <=1 SENSITIVE Sensitive     CIPROFLOXACIN >=4 RESISTANT Resistant     GENTAMICIN 2 RESISTANT Resistant     IMIPENEM 2 SENSITIVE Sensitive     NITROFURANTOIN >=512 RESISTANT Resistant     TRIMETH/SULFA >=320 RESISTANT Resistant     AMPICILLIN/SULBACTAM >=32 RESISTANT Resistant     PIP/TAZO <=4 SENSITIVE Sensitive     *  50,000 COLONIES/mL PROVIDENCIA STUARTII     Studies: No results found.  Scheduled Meds: . bisacodyl  5 mg Oral QODAY  . buPROPion  300 mg Oral Daily  . cefTAZidime (FORTAZ)  IV  2 g Intravenous Q8H  . Chlorhexidine Gluconate Cloth  6 each Topical Q0600  . diazepam  5 mg Oral Q12H  . docusate sodium  200 mg Oral BID  . enoxaparin (LOVENOX) injection  40 mg Subcutaneous Q24H  . gabapentin  300 mg Oral TID  . hydrochlorothiazide  25 mg Oral Daily  . insulin aspart  0-9 Units Subcutaneous TID WC  . insulin aspart protamine- aspart  35 Units Subcutaneous BID WC  . levothyroxine  250 mcg Oral QAC breakfast  . lubiprostone  24 mcg Oral BID WC  . methadone  30 mg Oral 3 times per day  . mupirocin ointment  1 application Nasal BID  . oxybutynin  5 mg Oral BID  . pantoprazole  40 mg Oral Daily  . PARoxetine  25 mg Oral Daily  . polyethylene glycol  17 g Oral Daily  . potassium chloride  10 mEq Oral  Daily  . sodium chloride  3 mL Intravenous Q12H  . sucralfate  1 g Oral TID WC & HS  . tiZANidine  2 mg Oral Daily  . tiZANidine  4 mg Oral QHS  . traZODone  100 mg Oral QHS  . [START ON 11/04/2015] Vitamin D (Ergocalciferol)  50,000 Units Oral Q14 Days   Continuous Infusions:   Principal Problem:   MDD (major depressive disorder), recurrent episode, severe (HCC) Active Problems:   Hypothyroidism   Essential hypertension, benign   UTI (lower urinary tract infection)   Abdominal pain   Diabetes mellitus type 2, controlled (HCC)    Time spent: 70 minutes    Edgar FriskJohnson, Rilee Knoll PA-S [facility]  If 7PM-7AM, please contact night-coverage at [email or web address], password [email or web address] 10/31/2015, 11:28 AM  LOS: 1 day

## 2015-11-03 ENCOUNTER — Other Ambulatory Visit: Payer: Self-pay | Admitting: *Deleted

## 2015-11-03 MED ORDER — METHADONE HCL 10 MG PO TABS: ORAL_TABLET | ORAL | Status: DC

## 2015-11-03 MED ORDER — OXYCODONE HCL 10 MG PO TABS: ORAL_TABLET | ORAL | Status: DC

## 2015-11-03 NOTE — Telephone Encounter (Signed)
[facility] 

## 2015-11-04 ENCOUNTER — Non-Acute Institutional Stay (SKILLED_NURSING_FACILITY): Payer: Medicare Other | Admitting: Internal Medicine

## 2015-11-04 DIAGNOSIS — J9610 Chronic respiratory failure, unspecified whether with hypoxia or hypercapnia: Secondary | ICD-10-CM | POA: Diagnosis not present

## 2015-11-04 DIAGNOSIS — D509 Iron deficiency anemia, unspecified: Secondary | ICD-10-CM | POA: Diagnosis not present

## 2015-11-04 DIAGNOSIS — K560 Paralytic ileus: Secondary | ICD-10-CM | POA: Diagnosis not present

## 2015-11-04 DIAGNOSIS — N10 Acute tubulo-interstitial nephritis: Secondary | ICD-10-CM | POA: Diagnosis not present

## 2015-11-04 DIAGNOSIS — F331 Major depressive disorder, recurrent, moderate: Secondary | ICD-10-CM

## 2015-11-07 ENCOUNTER — Emergency Department (HOSPITAL_COMMUNITY): Payer: Medicare Other

## 2015-11-07 ENCOUNTER — Encounter (HOSPITAL_COMMUNITY): Payer: Self-pay | Admitting: Emergency Medicine

## 2015-11-07 ENCOUNTER — Inpatient Hospital Stay (HOSPITAL_COMMUNITY)
Admission: EM | Admit: 2015-11-07 | Discharge: 2015-11-12 | DRG: 871 | Disposition: A | Discharge status: Home / Self Care | Payer: Medicare Other | Attending: Internal Medicine | Admitting: Internal Medicine

## 2015-11-07 DIAGNOSIS — E876 Hypokalemia: Secondary | ICD-10-CM | POA: Diagnosis present

## 2015-11-07 DIAGNOSIS — A419 Sepsis, unspecified organism: Secondary | ICD-10-CM | POA: Diagnosis present

## 2015-11-07 DIAGNOSIS — J189 Pneumonia, unspecified organism: Secondary | ICD-10-CM

## 2015-11-07 DIAGNOSIS — E119 Type 2 diabetes mellitus without complications: Secondary | ICD-10-CM

## 2015-11-07 DIAGNOSIS — G609 Hereditary and idiopathic neuropathy, unspecified: Secondary | ICD-10-CM | POA: Diagnosis present

## 2015-11-07 DIAGNOSIS — Z66 Do not resuscitate: Secondary | ICD-10-CM | POA: Diagnosis present

## 2015-11-07 DIAGNOSIS — R109 Unspecified abdominal pain: Secondary | ICD-10-CM

## 2015-11-07 DIAGNOSIS — J449 Chronic obstructive pulmonary disease, unspecified: Secondary | ICD-10-CM | POA: Diagnosis present

## 2015-11-07 DIAGNOSIS — B004 Herpesviral encephalitis: Secondary | ICD-10-CM | POA: Diagnosis present

## 2015-11-07 DIAGNOSIS — Z6841 Body Mass Index (BMI) 40.0 and over, adult: Secondary | ICD-10-CM

## 2015-11-07 DIAGNOSIS — G9340 Encephalopathy, unspecified: Secondary | ICD-10-CM | POA: Diagnosis present

## 2015-11-07 DIAGNOSIS — D509 Iron deficiency anemia, unspecified: Secondary | ICD-10-CM | POA: Diagnosis present

## 2015-11-07 DIAGNOSIS — J9601 Acute respiratory failure with hypoxia: Secondary | ICD-10-CM | POA: Diagnosis present

## 2015-11-07 DIAGNOSIS — E118 Type 2 diabetes mellitus with unspecified complications: Secondary | ICD-10-CM

## 2015-11-07 DIAGNOSIS — G894 Chronic pain syndrome: Secondary | ICD-10-CM | POA: Diagnosis present

## 2015-11-07 DIAGNOSIS — Y95 Nosocomial condition: Secondary | ICD-10-CM | POA: Diagnosis present

## 2015-11-07 DIAGNOSIS — G4733 Obstructive sleep apnea (adult) (pediatric): Secondary | ICD-10-CM | POA: Diagnosis present

## 2015-11-07 DIAGNOSIS — E039 Hypothyroidism, unspecified: Secondary | ICD-10-CM | POA: Diagnosis present

## 2015-11-07 DIAGNOSIS — I119 Hypertensive heart disease without heart failure: Secondary | ICD-10-CM | POA: Diagnosis present

## 2015-11-07 DIAGNOSIS — E873 Alkalosis: Secondary | ICD-10-CM | POA: Diagnosis present

## 2015-11-07 DIAGNOSIS — R4182 Altered mental status, unspecified: Secondary | ICD-10-CM

## 2015-11-07 DIAGNOSIS — F329 Major depressive disorder, single episode, unspecified: Secondary | ICD-10-CM | POA: Diagnosis present

## 2015-11-07 DIAGNOSIS — I10 Essential (primary) hypertension: Secondary | ICD-10-CM | POA: Diagnosis present

## 2015-11-07 DIAGNOSIS — Z09 Encounter for follow-up examination after completed treatment for conditions other than malignant neoplasm: Secondary | ICD-10-CM

## 2015-11-07 LAB — CBC WITH DIFFERENTIAL/PLATELET
BASOS PCT: 0 %
Basophils Absolute: 0 10*3/uL (ref 0.0–0.1)
EOS ABS: 0 10*3/uL (ref 0.0–0.7)
EOS PCT: 0 %
HCT: 24.2 % — ABNORMAL LOW (ref 36.0–46.0)
HEMOGLOBIN: 7 g/dL — AB (ref 12.0–15.0)
Lymphocytes Relative: 8 %
Lymphs Abs: 0.5 10*3/uL — ABNORMAL LOW (ref 0.7–4.0)
MCH: 25.7 pg — AB (ref 26.0–34.0)
MCHC: 28.9 g/dL — AB (ref 30.0–36.0)
MCV: 89 fL (ref 78.0–100.0)
Monocytes Absolute: 0.6 10*3/uL (ref 0.1–1.0)
Monocytes Relative: 11 %
NEUTROS PCT: 81 %
Neutro Abs: 4.4 10*3/uL (ref 1.7–7.7)
PLATELETS: 141 10*3/uL — AB (ref 150–400)
RBC: 2.72 MIL/uL — AB (ref 3.87–5.11)
RDW: 17.5 % — ABNORMAL HIGH (ref 11.5–15.5)
WBC: 5.4 10*3/uL (ref 4.0–10.5)

## 2015-11-07 LAB — BLOOD GAS, VENOUS
Acid-Base Excess: 3.7 mmol/L — ABNORMAL HIGH (ref 0.0–2.0)
Bicarbonate: 25.8 meq/L — ABNORMAL HIGH (ref 20.0–24.0)
Drawn by: 295031
FIO2: 0.21
O2 Saturation: 92 %
Patient temperature: 98.6
TCO2: 24.3 mmol/L (ref 0–100)
pCO2, Ven: 29 mmHg — ABNORMAL LOW (ref 45.0–50.0)
pH, Ven: 7.558 — ABNORMAL HIGH (ref 7.250–7.300)
pO2, Ven: 53.3 mmHg — ABNORMAL HIGH (ref 30.0–45.0)

## 2015-11-07 LAB — I-STAT CHEM 8, ED
BUN: 16 mg/dL (ref 6–20)
CHLORIDE: 91 mmol/L — AB (ref 101–111)
Calcium, Ion: 1 mmol/L — ABNORMAL LOW (ref 1.12–1.23)
Creatinine, Ser: 0.9 mg/dL (ref 0.44–1.00)
Glucose, Bld: 129 mg/dL — ABNORMAL HIGH (ref 65–99)
HCT: 35 % — ABNORMAL LOW (ref 36.0–46.0)
HEMOGLOBIN: 11.9 g/dL — AB (ref 12.0–15.0)
Potassium: 3.8 mmol/L (ref 3.5–5.1)
SODIUM: 135 mmol/L (ref 135–145)
TCO2: 37 mmol/L (ref 0–100)

## 2015-11-07 LAB — COMPREHENSIVE METABOLIC PANEL
ALK PHOS: 44 U/L (ref 38–126)
ALT: 11 U/L — AB (ref 14–54)
AST: 13 U/L — AB (ref 15–41)
Albumin: 2.4 g/dL — ABNORMAL LOW (ref 3.5–5.0)
Anion gap: 5 (ref 5–15)
BILIRUBIN TOTAL: 0.4 mg/dL (ref 0.3–1.2)
BUN: 11 mg/dL (ref 6–20)
CALCIUM: 6.2 mg/dL — AB (ref 8.9–10.3)
CO2: 27 mmol/L (ref 22–32)
CREATININE: 0.42 mg/dL — AB (ref 0.44–1.00)
Chloride: 109 mmol/L (ref 101–111)
Glucose, Bld: 96 mg/dL (ref 65–99)
Potassium: 2.4 mmol/L — CL (ref 3.5–5.1)
Sodium: 141 mmol/L (ref 135–145)
Total Protein: 5.1 g/dL — ABNORMAL LOW (ref 6.5–8.1)

## 2015-11-07 LAB — CBG MONITORING, ED: GLUCOSE-CAPILLARY: 123 mg/dL — AB (ref 65–99)

## 2015-11-07 LAB — URINALYSIS, ROUTINE W REFLEX MICROSCOPIC
Bilirubin Urine: NEGATIVE
GLUCOSE, UA: NEGATIVE mg/dL
HGB URINE DIPSTICK: NEGATIVE
KETONES UR: NEGATIVE mg/dL
LEUKOCYTES UA: NEGATIVE
Nitrite: NEGATIVE
PROTEIN: 30 mg/dL — AB
Specific Gravity, Urine: 1.017 (ref 1.005–1.030)
UROBILINOGEN UA: 1 mg/dL (ref 0.0–1.0)
pH: 8.5 — ABNORMAL HIGH (ref 5.0–8.0)

## 2015-11-07 LAB — I-STAT CG4 LACTIC ACID, ED
LACTIC ACID, VENOUS: 0.66 mmol/L (ref 0.5–2.0)
Lactic Acid, Venous: 2.18 mmol/L (ref 0.5–2.0)

## 2015-11-07 LAB — URINE MICROSCOPIC-ADD ON

## 2015-11-07 LAB — TROPONIN I: Troponin I: <0.03 ng/mL (ref <0.031)

## 2015-11-07 LAB — PROTIME-INR
INR: 1.13 (ref 0.00–1.49)
PROTHROMBIN TIME: 14.6 s (ref 11.6–15.2)

## 2015-11-07 MED ORDER — VANCOMYCIN HCL 10 G IV SOLR
2500.0000 mg | INTRAVENOUS | Status: AC
  Administered 2015-11-07: 2500 mg via INTRAVENOUS
  Filled 2015-11-07: qty 2500

## 2015-11-07 MED ORDER — POTASSIUM CHLORIDE 10 MEQ/100ML IV SOLN
10.0000 meq | INTRAVENOUS | Status: AC
  Administered 2015-11-07 (×2): 10 meq via INTRAVENOUS
  Filled 2015-11-07: qty 100

## 2015-11-07 MED ORDER — ACETAMINOPHEN 650 MG RE SUPP
650.0000 mg | Freq: Once | RECTAL | Status: AC
  Administered 2015-11-07: 650 mg via RECTAL
  Filled 2015-11-07: qty 1

## 2015-11-07 MED ORDER — DEXTROSE 5 % IV SOLN
2.0000 g | Freq: Three times a day (TID) | INTRAVENOUS | Status: DC
  Administered 2015-11-08 – 2015-11-11 (×10): 2 g via INTRAVENOUS
  Filled 2015-11-07 (×12): qty 2

## 2015-11-07 MED ORDER — ACETAMINOPHEN 650 MG RE SUPP
650.0000 mg | Freq: Once | RECTAL | Status: DC
  Filled 2015-11-07: qty 1

## 2015-11-07 MED ORDER — DEXTROSE 5 % IV SOLN
2.0000 g | INTRAVENOUS | Status: AC
  Administered 2015-11-07: 21:00:00 via INTRAVENOUS
  Filled 2015-11-07: qty 2

## 2015-11-07 MED ORDER — SODIUM CHLORIDE 0.9 % IV BOLUS (SEPSIS)
1000.0000 mL | Freq: Once | INTRAVENOUS | Status: AC
Start: 2015-11-07 — End: 2015-11-07
  Administered 2015-11-07: 1000 mL via INTRAVENOUS

## 2015-11-07 MED ORDER — MIDAZOLAM HCL 2 MG/2ML IJ SOLN
2.0000 mg | Freq: Once | INTRAMUSCULAR | Status: DC
  Filled 2015-11-07: qty 2

## 2015-11-07 MED ORDER — SODIUM CHLORIDE 0.9 % IV BOLUS (SEPSIS)
500.0000 mL | INTRAVENOUS | Status: AC
  Administered 2015-11-07: 500 mL via INTRAVENOUS

## 2015-11-07 MED ORDER — ONDANSETRON HCL 4 MG/2ML IJ SOLN
4.0000 mg | Freq: Once | INTRAMUSCULAR | Status: AC
  Administered 2015-11-07: 4 mg via INTRAVENOUS
  Filled 2015-11-07: qty 2

## 2015-11-07 MED ORDER — SODIUM CHLORIDE 0.9 % IV BOLUS (SEPSIS)
1000.0000 mL | INTRAVENOUS | Status: AC
  Administered 2015-11-07 (×4): 1000 mL via INTRAVENOUS

## 2015-11-07 MED ORDER — SODIUM CHLORIDE 0.9 % IV SOLN
1250.0000 mg | Freq: Two times a day (BID) | INTRAVENOUS | Status: DC
  Administered 2015-11-08 – 2015-11-11 (×7): 1250 mg via INTRAVENOUS
  Filled 2015-11-07 (×7): qty 1250

## 2015-11-07 MED ORDER — METRONIDAZOLE IN NACL 5-0.79 MG/ML-% IV SOLN
500.0000 mg | Freq: Once | INTRAVENOUS | Status: AC
  Administered 2015-11-07: 500 mg via INTRAVENOUS
  Filled 2015-11-07: qty 100

## 2015-11-07 NOTE — ED Notes (Signed)
Patient transported to CT 

## 2015-11-07 NOTE — ED Provider Notes (Signed)
CSN: [id number]     Arrival date & time 11/07/15  1532 History   First MD Initiated Contact with Patient 11/07/15 1601     Chief Complaint  Patient presents with  . Altered Mental Status  . Fever     (Consider location/radiation/quality/duration/timing/severity/associated sxs/prior Treatment) HPI  History is limited due to the patient's mental status.  Patient is a 54 year old female with history of T2DM, HTN, hypothyroidism, OSA, chronic pain, and major depression presenting with fever and altered mental status. Patient is only able to say "I'm sick" and "that hurts" on interview. Patient is unable to state her name, location, or date. Per the patient's nursing home, she is unable to ambulate at baseline due to chronic lyme disease though is normally oriented able able to have a conversation. Per report, the patient was in her normal state of health yesterday. This morning, she was found to be disoriented and moaning in pain. Patient was found to have a fever up to 101F, 911 was called, and the patient was brought to the ED. Of note, patient was recently discharged 7 days ago after being admitted for a pseudomonas UTI. Patient has a chronic indwelling foley catheter.   Past Medical History  Diagnosis Date  . Diabetes mellitus without complication (HCC)   . Chronic pain   . Morbid obesity (HCC)   . OSA (obstructive sleep apnea)   . Generalized weakness   . Fibromyalgia   . Lyme disease   . Anemia   . History of blood transfusion     pt states she has had 5 blood transfusions  . Sleep disorder   . UTI (lower urinary tract infection) 02/22/2014  . Hypertension   . Hypothyroidism   . COPD (chronic obstructive pulmonary disease) (HCC)   . Shortness of breath   . GERD (gastroesophageal reflux disease)    Past Surgical History  Procedure Laterality Date  . Explaratory      on stomach,   . Appendectomy    . Foot surgery      due to stepping on broken glass  . Spinal tap    .  Supra pubic catheter    . Esophagogastroduodenoscopy (egd) with propofol N/A 03/05/2014    Procedure: ESOPHAGOGASTRODUODENOSCOPY (EGD) WITH PROPOFOL;  Surgeon: Willis Modena, MD;  Location: [facility];  Service: Endoscopy;  Laterality: N/A;  . Colonoscopy with propofol N/A 03/05/2014    Procedure: COLONOSCOPY WITH PROPOFOL;  Surgeon: Willis Modena, MD;  Location: [facility];  Service: Endoscopy;  Laterality: N/A;  . Dental restoration/extraction with x-ray    . Colonoscopy with propofol N/A 03/06/2014    Procedure: COLONOSCOPY WITH PROPOFOL;  Surgeon: Willis Modena, MD;  Location: [facility];  Service: Endoscopy;  Laterality: N/A;   Family History  Problem Relation Age of Onset  . Diabetes Mellitus II Mother    Social History  Substance Use Topics  . Smoking status: Former Smoker    Quit date: 06/22/2008  . Smokeless tobacco: None  . Alcohol Use: No   OB History    No data available     Review of Systems  Unable to perform ROS: Mental status change    Allergies  Ibuprofen; Sulfa antibiotics; Levofloxacin; Lisinopril; and Penicillins  Home Medications   Prior to Admission medications   Medication Sig Start Date End Date Taking? Authorizing Provider  buPROPion (WELLBUTRIN XL) 300 MG 24 hr tablet Take 300 mg by mouth daily.   Yes Historical Provider, MD  diazepam (VALIUM) 5 MG  tablet Take 1-2 tablets (5-10 mg total) by mouth every 12 (twelve) hours as needed for anxiety (TAKES  IN AM AND  IN PM, ONLY AS NEEDED FOR ANXIETY). 10/31/15  Yes Costin Otelia Sergeant, MD  docusate sodium 100 MG CAPS Take 200 mg by mouth 2 (two) times daily. 03/08/14  Yes Leroy Sea, MD  gabapentin (NEURONTIN) 300 MG capsule Take 300 mg by mouth 2 (two) times daily.   Yes Historical Provider, MD  insulin lispro (HUMALOG) 100 UNIT/ML injection Inject 0-8 Units into the skin See admin instructions. Sliding Scale - Inject before each meal and at bed time   Yes Historical Provider, MD  insulin  NPH-regular Human (NOVOLIN 70/30) (70-30) 100 UNIT/ML injection Inject 25 Units into the skin 2 (two) times daily with a meal. 03/11/14  Yes Leroy Sea, MD  levothyroxine (SYNTHROID, LEVOTHROID) 125 MCG tablet Take 250 mcg by mouth daily before breakfast.   Yes Historical Provider, MD  lubiprostone (AMITIZA) 24 MCG capsule Take 24 mcg by mouth 2 (two) times daily with a meal.   Yes Historical Provider, MD  methadone (DOLOPHINE) 10 MG tablet Take three tablets by mouth three times daily for pain 11/03/15  Yes Tiffany L Reed, DO  omeprazole (PRILOSEC) 20 MG capsule Take 20 mg by mouth daily.   Yes Historical Provider, MD  oxybutynin (DITROPAN) 5 MG tablet Take 5 mg by mouth 2 (two) times daily.    Yes Historical Provider, MD  Oxycodone HCl 10 MG TABS Take one tablet by mouth every 4 hours as needed for pain 11/03/15  Yes Tiffany L Reed, DO  PARoxetine (PAXIL-CR) 25 MG 24 hr tablet Take 1 tablet (25 mg total) by mouth daily. 10/31/15  Yes Costin Otelia Sergeant, MD  polyethylene glycol (MIRALAX / GLYCOLAX) packet Take 17 g by mouth 2 (two) times daily. 03/08/14  Yes Leroy Sea, MD  Polyvinyl Alcohol-Povidone (FRESHKOTE) 2.7-2 % SOLN Place 1 drop into both eyes 2 (two) times daily.   Yes Historical Provider, MD  potassium chloride (MICRO-K) 10 MEQ CR capsule Take 10 mEq by mouth daily.   Yes Historical Provider, MD  sucralfate (CARAFATE) 1 G tablet Take 1 g by mouth 4 (four) times daily -  with meals and at bedtime.   Yes Historical Provider, MD  traZODone (DESYREL) 100 MG tablet Take 100 mg by mouth at bedtime.   Yes Historical Provider, MD  acetaminophen (TYLENOL) 325 MG tablet Take 650 mg by mouth every 4 (four) hours as needed for mild pain.    Historical Provider, MD  benzocaine (ORAJEL) 10 % mucosal gel Use as directed 1 application in the mouth or throat 3 (three) times daily as needed for mouth pain.    Historical Provider, MD  bisacodyl (DULCOLAX) 5 MG EC tablet Take 2 tablets (10 mg total) by  mouth daily. Patient taking differently: Take 5 mg by mouth every other day.  03/08/14   Leroy Sea, MD  cefpodoxime (VANTIN) 100 MG tablet Take 1 tablet (100 mg total) by mouth 2 (two) times daily. For 11 additional days Patient not taking: Reported on 11/07/2015 10/31/15   Leatha Gilding, MD  ciprofloxacin (CIPRO) 500 MG tablet Take 1 tablet (500 mg total) by mouth 2 (two) times daily. For 11 additional days Patient not taking: Reported on 11/07/2015 10/31/15   Leatha Gilding, MD  cloNIDine (CATAPRES) 0.1 MG tablet Take 0.1 mg by mouth as needed.    Historical Provider, MD  guaifenesin (ROBITUSSIN)  100 MG/5ML syrup Take 200 mg by mouth every 4 (four) hours as needed for cough.    Historical Provider, MD  hydrochlorothiazide (HYDRODIURIL) 25 MG tablet Take 25 mg by mouth daily.    Historical Provider, MD  nitroGLYCERIN (NITROSTAT) 0.4 MG SL tablet Place 0.4 mg under the tongue every 5 (five) minutes as needed for chest pain.    Historical Provider, MD  phenazopyridine (PYRIDIUM) 200 MG tablet Take 200 mg by mouth 3 (three) times daily as needed for pain.    Historical Provider, MD  Vitamin D, Ergocalciferol, (DRISDOL) 50000 UNITS CAPS capsule Take 50,000 Units by mouth every 14 (fourteen) days.    Historical Provider, MD   BP 114/53 mmHg  Pulse 80  Temp(Src) 100.3 F (37.9 C) (Rectal)  Resp 15  SpO2 99% Physical Exam  Constitutional: She appears well-developed and well-nourished. She is uncooperative. She appears distressed.  Patient thrashing about in bed.   HENT:  Head: Normocephalic and atraumatic.  Eyes: EOM are normal.  Neck: Normal range of motion. Neck supple.  Cardiovascular: Normal rate, regular rhythm and normal heart sounds.   Pulmonary/Chest: Effort normal and breath sounds normal. No respiratory distress.  Abdominal: Soft. She exhibits distension. There is tenderness. There is guarding. There is no rebound.  Suprapubic catheter in place. Dressing with small amount of  brown discharge. No erythema or purulence noted.   Musculoskeletal: Normal range of motion.  Neurological: She is alert. She is disoriented. No cranial nerve deficit.  Exam limited by patient's mental status.   Nursing note and vitals reviewed.   ED Course  Procedures (including critical care time) Labs Review Labs Reviewed  COMPREHENSIVE METABOLIC PANEL - Abnormal; Notable for the following:    Potassium 2.4 (*)    Creatinine, Ser 0.42 (*)    Calcium 6.2 (*)    Total Protein 5.1 (*)    Albumin 2.4 (*)    AST 13 (*)    ALT 11 (*)    All other components within normal limits  CBC WITH DIFFERENTIAL/PLATELET - Abnormal; Notable for the following:    RBC 2.72 (*)    Hemoglobin 7.0 (*)    HCT 24.2 (*)    MCH 25.7 (*)    MCHC 28.9 (*)    RDW 17.5 (*)    Platelets 141 (*)    Lymphs Abs 0.5 (*)    All other components within normal limits  URINALYSIS, ROUTINE W REFLEX MICROSCOPIC (NOT AT [facility]) - Abnormal; Notable for the following:    pH 8.5 (*)    Protein, ur 30 (*)    All other components within normal limits  BLOOD GAS, VENOUS - Abnormal; Notable for the following:    pH, Ven 7.558 (*)    pCO2, Ven 29.0 (*)    pO2, Ven 53.3 (*)    Bicarbonate 25.8 (*)    Acid-Base Excess 3.7 (*)    All other components within normal limits  I-STAT CHEM 8, ED - Abnormal; Notable for the following:    Chloride 91 (*)    Glucose, Bld 129 (*)    Calcium, Ion 1.00 (*)    Hemoglobin 11.9 (*)    HCT 35.0 (*)    All other components within normal limits  CBG MONITORING, ED - Abnormal; Notable for the following:    Glucose-Capillary 123 (*)    All other components within normal limits  I-STAT CG4 LACTIC ACID, ED - Abnormal; Notable for the following:    Lactic Acid, Venous 2.18 (*)  All other components within normal limits  CULTURE, BLOOD (ROUTINE X 2)  CULTURE, BLOOD (ROUTINE X 2)  URINE CULTURE  PROTIME-INR  TROPONIN I  URINE MICROSCOPIC-ADD ON  I-STAT CG4 LACTIC ACID, ED     Imaging Review Ct Head Wo Contrast  11/07/2015  CLINICAL DATA:  Fever and acute mental status changes. EXAM: CT HEAD WITHOUT CONTRAST TECHNIQUE: Contiguous axial images were obtained from the base of the skull through the vertex without intravenous contrast. COMPARISON:  None. FINDINGS: Mild age advanced cerebral atrophy and ventriculomegaly. No extra-axial fluid collections. No CT findings for acute hemispheric infarction or intracranial hemorrhage. No mass lesions. The brainstem and cerebellum are grossly normal. No acute bony findings. No worrisome bone lesions. The paranasal sinuses and mastoid air cells are clear. Minimal scattered ethmoid disease. The globes are intact. IMPRESSION: No acute intracranial findings Electronically Signed   By: Rudie Meyer M.D.   On: 11/07/2015 17:53   Dg Abd Acute W/chest  11/07/2015  CLINICAL DATA:  54 year old nursing home patient with acute fever, mental status changes, generalized abdominal pain and vomiting. Recently treated for urinary tract infection. Indwelling suprapubic catheter. EXAM: DG ABDOMEN ACUTE W/ 1V CHEST COMPARISON:  CT abdomen and pelvis 10/28/2015. Two-view chest x-ray 10/27/2015, 02/22/2014. FINDINGS: Moderate gaseous distension of the tortuous and redundant sigmoid colon, similar to the recent CT. Very large stool burden throughout the colon with the exception of the gas distended sigmoid, again similar to the recent CT. No evidence of free intraperitoneal air on the lateral decubitus image. No visible opaque urinary tract calculi. Phleboliths in the pelvis. Cardiac silhouette enlarged even allowing for the AP supine technique. Pulmonary vascularity normal. Airspace consolidation in the medial right lung base. Lungs otherwise clear. No pleural effusions. IMPRESSION: 1. No acute abdominal abnormality.  Very large colonic stool burden. 2. Chronic gaseous distension of the tortuous and redundant sigmoid colon which extends up into the right  upper quadrant of the abdomen as noted on prior CT. 3. Acute pneumonia involving the right lung base. 4. Stable cardiomegaly without pulmonary edema. Electronically Signed   By: Hulan Saas M.D.   On: 11/07/2015 17:34   Ct Renal Stone Study  11/07/2015  CLINICAL DATA:  Fever and acute mental status changes. Patient has been moaning and vomiting. EXAM: CT ABDOMEN AND PELVIS WITHOUT CONTRAST TECHNIQUE: Multidetector CT imaging of the abdomen and pelvis was performed following the standard protocol without IV contrast. COMPARISON:  CT scan 10/28/2015 FINDINGS: Lower chest: Minimal streaky bibasilar atelectasis. No pleural effusion. The heart is borderline enlarged. No pericardial effusion. The distal esophagus is grossly normal. Hepatobiliary: No focal hepatic lesions or intrahepatic biliary dilatation. The gallbladder demonstrate suspected layering small gallstones. No findings for acute cholecystitis. No common bile duct dilatation. Pancreas: Moderate fatty atrophy of the pancreas but no mass or inflammation. Spleen: Stable mild splenomegaly.  No focal lesions. Adrenals/Urinary Tract: The adrenal glands and kidneys are unremarkable. No renal or obstructing ureteral calculi or bladder calculi. The bladder is decompressed by a suprapubic catheter. Mild diffuse wall thickening. Stomach/Bowel: The stomach, duodenum, small bowel and colon are grossly normal without oral contrast. There is a moderate to large amount of stool throughout the colon which may suggest constipation. Stable mild muscular wall thickening of the sigmoid colon. No findings for acute diverticulitis. Vascular/Lymphatic: No mesenteric or retroperitoneal mass or adenopathy. Small scattered lymph nodes are stable. The aorta is normal in caliber. Scattered atherosclerotic calcifications. Other: Stable uterine fibroids. The ovaries are normal. Stable small scattered  pelvic and inguinal lymph nodes. No mass or free fluid in the pelvis.  Musculoskeletal: No significant bony findings. IMPRESSION: 1. Streaky bibasilar atelectasis but no definite infiltrates. 2. Suspect layering small gallstones in the gallbladder but no findings for acute cholecystitis. 3. Stable splenomegaly. 4. Muscular wall thickening of the sigmoid colon but no inflammatory changes. 5. Large amount of stool throughout the colon suggesting constipation. 6. Stable uterine fibroids. Electronically Signed   By: Rudie Meyer M.D.   On: 11/07/2015 18:17   I have personally reviewed and evaluated these images and lab results as part of my medical decision-making.   EKG Interpretation   Date/Time:  Friday November 07 2015 15:53:52 EST Ventricular Rate:  77 PR Interval:  153 QRS Duration: 80 QT Interval:  388 QTC Calculation: 439 R Axis:   23 Text Interpretation:  Sinus rhythm No acute changes Confirmed by Rhunette Croft,  MD, Janey Genta [phone]) on 11/07/2015 5:25:46 PM      MDM   Final diagnoses:  HCAP (healthcare-associated pneumonia)    4:35 PM Patient is a 54 year old female with history of T2DM, HTN, hypothyroidism, OSA, chronic pain, and major depression presenting with fever and altered mental status. Patient is disoriented and not cooperative with exam, though there is some concern for an acute abdomen. BP and HR currently stable. Will check CBC, CMP, lactic acid, troponin, VBG, UA, head CT, CXR, and abdominal plain film. Will also obtain urine and blood cultures.   5:47 PM Plain film shows acute pneumonia. Patient with 3L oxygen requirement. Will start HCAP coverage with vanc/ceftaz given recent hospitalization. Will also start flagyl given recent abdominal CT with colitis. Will obtain noncontrast CT abdomen to rule out worsening colitis. UA negative. Calcium 6.2, though corrects to 7.7. Ionized calcium mildly decreased to 1.00. Will replete K.   7:19 PM Noncontrast abdominal CT negative. Patient desatted now requiring home level of BiPAP. More alert and now  oriented to place and person. Will call hospitalist for admission for HCAP.    Ardith Dark, MD 11/07/15 1940  Derwood Kaplan, MD 11/08/15 6045

## 2015-11-07 NOTE — Progress Notes (Addendum)
ANTIBIOTIC CONSULT NOTE - INITIAL  Pharmacy Consult for Vancomycin / Ceftazidime Indication: HCAP  Allergies  Allergen Reactions  . Ibuprofen Other (See Comments) and Shortness Of Breath  . Sulfa Antibiotics Other (See Comments), Itching and Shortness Of Breath  . Levofloxacin Nausea Only    Other fluoroquinolones OK.  . Lisinopril Other (See Comments)  . Penicillins Other (See Comments) and Hives    Patient Measurements:   Adjusted Body Weight:   Vital Signs: Temp: 104.8 F (40.4 C) (11/11 1555) Temp Source: Oral (11/11 1555) BP: 127/45 mmHg (11/11 1738) Pulse Rate: 81 (11/11 1738) Intake/Output from previous day:   Intake/Output from this shift:    Labs:  Recent Labs  11/07/15 1639 11/07/15 1653  WBC 5.4  --   HGB 7.0* 11.9*  PLT 141*  --   CREATININE 0.42* 0.90   Estimated Creatinine Clearance: 103.6 mL/min (by C-G formula based on Cr of 0.9). No results for input(s): VANCOTROUGH, VANCOPEAK, VANCORANDOM, GENTTROUGH, GENTPEAK, GENTRANDOM, TOBRATROUGH, TOBRAPEAK, TOBRARND, AMIKACINPEAK, AMIKACINTROU, AMIKACIN in the last 72 hours.   Microbiology: Recent Results (from the past 720 hour(s))  MRSA PCR Screening     Status: Abnormal   Collection Time: 10/28/15  5:17 AM  Result Value Ref Range Status   MRSA by PCR POSITIVE (A) NEGATIVE Final    Comment:        The GeneXpert MRSA Assay (FDA approved for NASAL specimens only), is one component of a comprehensive MRSA colonization surveillance program. It is not intended to diagnose MRSA infection nor to guide or monitor treatment for MRSA infections. RESULT CALLED TO, READ BACK BY AND VERIFIED WITH: Jeani Sow RN BY A. DAVIS 10/28/15 AT 0800   Urine culture     Status: None   Collection Time: 10/28/15  3:10 PM  Result Value Ref Range Status   Specimen Description URINE, SUPRAPUBIC  Final   Special Requests NONE  Final   Culture   Final    >=100,000 COLONIES/mL PSEUDOMONAS AERUGINOSA 50,000 COLONIES/mL  PROVIDENCIA STUARTII    Report Status 10/31/2015 FINAL  Final   Organism ID, Bacteria PSEUDOMONAS AERUGINOSA  Final   Organism ID, Bacteria PROVIDENCIA STUARTII  Final      Susceptibility   Pseudomonas aeruginosa - MIC*    CEFTAZIDIME 4 SENSITIVE Sensitive     CIPROFLOXACIN <=0.25 SENSITIVE Sensitive     GENTAMICIN 4 SENSITIVE Sensitive     IMIPENEM 2 SENSITIVE Sensitive     PIP/TAZO 16 SENSITIVE Sensitive     CEFEPIME 2 SENSITIVE Sensitive     * >=100,000 COLONIES/mL PSEUDOMONAS AERUGINOSA   Providencia stuartii - MIC*    AMPICILLIN >=32 RESISTANT Resistant     CEFAZOLIN >=64 RESISTANT Resistant     CEFTRIAXONE <=1 SENSITIVE Sensitive     CIPROFLOXACIN >=4 RESISTANT Resistant     GENTAMICIN 2 RESISTANT Resistant     IMIPENEM 2 SENSITIVE Sensitive     NITROFURANTOIN >=512 RESISTANT Resistant     TRIMETH/SULFA >=320 RESISTANT Resistant     AMPICILLIN/SULBACTAM >=32 RESISTANT Resistant     PIP/TAZO <=4 SENSITIVE Sensitive     * 50,000 COLONIES/mL PROVIDENCIA STUARTII    Medical History: Past Medical History  Diagnosis Date  . Diabetes mellitus without complication (HCC)   . Chronic pain   . Morbid obesity (HCC)   . OSA (obstructive sleep apnea)   . Generalized weakness   . Fibromyalgia   . Lyme disease   . Anemia   . History of blood transfusion  pt states she has had 5 blood transfusions  . Sleep disorder   . UTI (lower urinary tract infection) 02/22/2014  . Hypertension   . Hypothyroidism   . COPD (chronic obstructive pulmonary disease) (HCC)   . Shortness of breath   . GERD (gastroesophageal reflux disease)    Assessment: [age] yoF with hx T2DM, hypothyroidism, OSA, chronic pain, depression, chronic indwelling foley and recent hospitalization for pseudomonas UTI still taking ciprofloxacin and cefpodoxime PTA presents from NH with fever and AMS. Found to have Tm 104.8 in ED with elevated lactic acid. CXR shows acute PNA in right lung base. Pharmacy consulted to  start vancomycin and ceftazidime for HCAP. Noted allergies.   Anti-infectives 11/11 >> Vancomycin >> 11/11 >> Ceftazidime >>   Vitals/Labs WBC: WNL Tm24h: 104.8 Renal: SCr 0.90, CrCl ~100 CG/80N  Cultures 11/11 bloodx2: 11/11 urine: collected  11/1 urine: >100K pseudomonas (pansens), >50K providencia (R-amp, unasyn, ancef, cipro, gent, macrobid, bactrim, S ceftriaxone, imipenem, zosyn)  Goal of Therapy:  Vancomycin trough level 15-20 mcg/ml  Plan:  Ceftazidime 2g IV q8h Vancomycin 2500mg  IV x1, then 1250mg  IV q12h F/u renal function, cultures, clinical course Check VT at Css as warranted  [name], PharmD, BCPS 11/07/2015, 6:00 PM  Pager: [phone]

## 2015-11-07 NOTE — ED Notes (Signed)
Bed: WA02 Expected date:  Expected time:  Means of arrival:  Comments: EMS 

## 2015-11-07 NOTE — H&P (Signed)
History and Physical  Patient Name: [name]     QMV:[id number]    DOB: 10-10-1961    DOA: 11/07/2015 Referring physician: Jacquiline Doe, MD PCP: Terald Sleeper, MD      Chief Complaint: Confusion  HPI: [name] is a 54 y.o. 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  The patient was just discharged from [facility] after pseudomonas UTI in her indwelling Foley 1 week ago back to her nursing home.  This morning, she was reportedly found by nursing aides altered and moaning with fever to 101F.  In the ED, the patient was febrile to 104.55F, tachypneic, and hypoxic. A chest x-ray showed a right lower lobe opacity and she required BiPAP briefly. The lactic acid level was elevated and the patient had a protuberant abdomen and so a CT of the abdomen and pelvis with contrast was ordered. This showed no focal intra-abdominal findings but did confirm consolidation of the right lung base. A VBG showed respiratory alkalosis.   A lumbar puncture was attempted, but failed due to patient uncooperation.     Review of Systems:  Pt complains of headache, photophobia, cough, dyspnea, chest tightness, back pain, leg weakness (chronic), fever. All other systems negative except as just noted or noted in the history of present illness.  Allergies  Allergen Reactions  . Ibuprofen Other (See Comments) and Shortness Of Breath  . Sulfa Antibiotics Other (See Comments), Itching and Shortness Of Breath  . Levofloxacin Nausea Only    Other fluoroquinolones OK.  . Lisinopril Other (See Comments)  . Penicillins Other (See Comments) and Hives    Prior to Admission medications   Medication Sig Start Date End Date Taking? Authorizing Provider  buPROPion (WELLBUTRIN XL) 300 MG 24 hr tablet Take 300 mg by mouth daily.    Yes Historical Provider, MD  diazepam (VALIUM) 5 MG tablet Take 1-2 tablets (5-10 mg total) by mouth every 12 (twelve) hours as needed for anxiety (TAKES  IN AM AND  IN PM, ONLY AS NEEDED FOR ANXIETY). 10/31/15  Yes Costin Otelia Sergeant, MD  docusate sodium 100 MG CAPS Take 200 mg by mouth 2 (two) times daily. 03/08/14  Yes Leroy Sea, MD  gabapentin (NEURONTIN) 300 MG capsule Take 300 mg by mouth 2 (two) times daily.   Yes Historical Provider, MD  insulin lispro (HUMALOG) 100 UNIT/ML injection Inject 0-8 Units into the skin See admin instructions. Sliding Scale - Inject before each meal and at bed time   Yes Historical Provider, MD  insulin NPH-regular Human (NOVOLIN 70/30) (70-30) 100 UNIT/ML injection Inject 25 Units into the skin 2 (two) times daily with a meal. 03/11/14  Yes Leroy Sea, MD  levothyroxine (SYNTHROID, LEVOTHROID) 125 MCG tablet Take 250 mcg by mouth daily before breakfast.   Yes Historical Provider, MD  lubiprostone (AMITIZA) 24 MCG capsule Take 24 mcg by mouth 2 (two) times daily with a meal.   Yes Historical Provider, MD  methadone (DOLOPHINE) 10 MG tablet Take three tablets by mouth three times daily for pain 11/03/15  Yes Tiffany L Reed, DO  omeprazole (PRILOSEC) 20 MG capsule Take 20 mg by mouth daily.   Yes Historical Provider, MD  oxybutynin (DITROPAN) 5 MG tablet Take 5 mg by mouth 2 (two) times daily.    Yes Historical Provider, MD  Oxycodone HCl 10 MG TABS  Take one tablet by mouth every 4 hours as needed for pain 11/03/15  Yes Tiffany L Reed, DO  PARoxetine (PAXIL-CR) 25 MG 24 hr tablet Take 1 tablet (25 mg total) by mouth daily. 10/31/15  Yes Costin Otelia Sergeant, MD  polyethylene glycol (MIRALAX / GLYCOLAX) packet Take 17 g by mouth 2 (two) times daily. 03/08/14  Yes Leroy Sea, MD  Polyvinyl Alcohol-Povidone (FRESHKOTE) 2.7-2 % SOLN Place 1 drop into both eyes 2 (two) times daily.   Yes Historical Provider, MD  potassium chloride (MICRO-K) 10 MEQ CR capsule  Take 10 mEq by mouth daily.   Yes Historical Provider, MD  sucralfate (CARAFATE) 1 G tablet Take 1 g by mouth 4 (four) times daily -  with meals and at bedtime.   Yes Historical Provider, MD  traZODone (DESYREL) 100 MG tablet Take 100 mg by mouth at bedtime.   Yes Historical Provider, MD  acetaminophen (TYLENOL) 325 MG tablet Take 650 mg by mouth every 4 (four) hours as needed for mild pain.    Historical Provider, MD  benzocaine (ORAJEL) 10 % mucosal gel Use as directed 1 application in the mouth or throat 3 (three) times daily as needed for mouth pain.    Historical Provider, MD  bisacodyl (DULCOLAX) 5 MG EC tablet Take 2 tablets (10 mg total) by mouth daily. Patient taking differently: Take 5 mg by mouth every other day.  03/08/14   Leroy Sea, MD  cloNIDine (CATAPRES) 0.1 MG tablet Take 0.1 mg by mouth as needed.    Historical Provider, MD  guaifenesin (ROBITUSSIN) 100 MG/5ML syrup Take 200 mg by mouth every 4 (four) hours as needed for cough.    Historical Provider, MD  hydrochlorothiazide (HYDRODIURIL) 25 MG tablet Take 25 mg by mouth daily.    Historical Provider, MD  nitroGLYCERIN (NITROSTAT) 0.4 MG SL tablet Place 0.4 mg under the tongue every 5 (five) minutes as needed for chest pain.    Historical Provider, MD  phenazopyridine (PYRIDIUM) 200 MG tablet Take 200 mg by mouth 3 (three) times daily as needed for pain.    Historical Provider, MD  Vitamin D, Ergocalciferol, (DRISDOL) 50000 UNITS CAPS capsule Take 50,000 Units by mouth every 14 (fourteen) days.    Historical Provider, MD    Past Medical History  Diagnosis Date  . Diabetes mellitus without complication (HCC)   . Chronic pain   . Morbid obesity (HCC)   . OSA (obstructive sleep apnea)   . Generalized weakness   . Fibromyalgia   . Lyme disease   . Anemia   . History of blood transfusion     pt states she has had 5 blood transfusions  . Sleep disorder   . UTI (lower urinary tract infection) 02/22/2014  . Hypertension     . Hypothyroidism   . COPD (chronic obstructive pulmonary disease) (HCC)   . Shortness of breath   . GERD (gastroesophageal reflux disease)     Past Surgical History  Procedure Laterality Date  . Explaratory      on stomach,   . Appendectomy    . Foot surgery      due to stepping on broken glass  . Spinal tap    . Supra pubic catheter    . Esophagogastroduodenoscopy (egd) with propofol N/A 03/05/2014    Procedure: ESOPHAGOGASTRODUODENOSCOPY (EGD) WITH PROPOFOL;  Surgeon: Willis Modena, MD;  Location: [facility];  Service: Endoscopy;  Laterality: N/A;  . Colonoscopy with propofol N/A 03/05/2014  Procedure: COLONOSCOPY WITH PROPOFOL;  Surgeon: Willis ModenaWilliam Outlaw, MD;  Location: [facility];  Service: Endoscopy;  Laterality: N/A;  . Dental restoration/extraction with x-ray    . Colonoscopy with propofol N/A 03/06/2014    Procedure: COLONOSCOPY WITH PROPOFOL;  Surgeon: Willis ModenaWilliam Outlaw, MD;  Location: [facility];  Service: Endoscopy;  Laterality: N/A;    Family history: family history includes Diabetes Mellitus II in her mother.  Social History: Patient lives at a nursing home.  She is not able to walk at baseline.  She   reports that she quit smoking about 7 years ago. She does not have any smokeless tobacco history on file. She reports that she does not drink alcohol or use illicit drugs.     Physical Exam: BP 107/47 mmHg  Pulse 71  Temp(Src) 99.8 F (37.7 C) (Oral)  Resp 14  SpO2 100% General appearance: Obese adult female, sleepy but easily rousable and oriented.  Eyes: Anicteric, conjunctiva pink, lids and lashes normal.     ENT: No nasal deformity, discharge, or epistaxis.  OP moist without lesions.  The neck is supple without pain.   Skin: Warm and dry.  No jaundice.  No suspicious rashes or lesions. Cardiac: RRR, nl S1-S2, no murmurs appreciated.   Respiratory: Comfortable now off Bipap.  There are rales localized to the right base, and coarse airway sounds  elsewhere. Abdomen: Abdomen large, soft without rigidity.  Mild TTP hard to localize. No specifically discernible ascites, distension.   Neuro: Sleepy but oriented to person, place, time and situation.  Responding to questions, attention normal.  Speech is fluent.  Cranial nerves 3-12 intact.  Babinski downgoing.  No focal numbness to light touch.  Psych: Behavior appropriate.  Affect normal.  No evidence of aural or visual hallucinations or delusions.       Labs on Admission:  The metabolic panel shows hypokalemia. The renal function is normal. Low calcium. The transaminases and bilirubin are normal. The complete blood count shows WBC 5.4K/uL, anemia 2 7 g/dL from a previous of 8.7 g/dL. Mild thrombocytopenia. Urinalysis is clear. Troponin is negative. INR is normal. Lactic acid level is 2.18 kg/L. VBG shows 7.55 pH, CO2 29 and oxygen 53     Radiological Exams on Admission: Ct Head Wo Contrast 11/07/2015   No acute intracranial process    Personally reviewed: Dg Abd Acute W/chest 11/07/2015   Large colonic stool burden without evidence of SBO. Focal opacity in RL base    Ct Renal Stone Study 11/07/2015  IMPRESSION:  1. Streaky bibasilar atelectasis but no definite infiltrates. 2. Suspect layering small gallstones in the gallbladder but no findings for acute cholecystitis. 3. Stable splenomegaly. 4. Muscular wall thickening of the sigmoid colon but no inflammatory changes. 5. Large amount of stool throughout the colon suggesting constipation. 6. Stable uterine fibroids.      EKG: Independently reviewed. NSR.    Assessment/Plan 1. Sepsis, HCAP:  This is new.  The patient presents with fever, hypoxia, and cough. Suspected source lung. Organisms: at risk for DR organism given recent hospitalization and broadspectrum antibiotics. Patient meets criteria given tachypnea, fever, and evidence of organ dysfunction (lung, AMS).  Blood and urine cultures drawn.  Lactate exceeds  2 mmol/L and repeat resolved.  MAP > 65 mmHg. -Antibiotics delivered in the ED.   -Walls continue vancomycin and ceftazidime -Acyclovir for empiric coverage of HSV encephalitis, given high fever and altered mental status.  LP was attempted but refused by patient.  Walls order MRI to rule  out temporal lobe enhancement -Urine strep and legionella antigens -Follow blood culture -Sputum culture and gram stain   2. Anemia, acute on chronic:  Anemia of iron deficiency. -Monitor CBC, transfuse as necessary  3. Hypokalemia:  -Replete with oral potassium and K containing MIVF -Check magnesium  4. DMII:  Normoglycemic at admission. -Sliding scale Novolog corrections -Start BID glargine 15 units if needed (takes 25 units 70/30 BID at home)  5. Hypothyroidism:  Stable.  -Continue levothyroxine  6. HTN:  Stable.  -Continue HCTZ  7. Depression/Psychiatric/Chronic pain:  Stable.  -Continue methadone -Continue paroxetine and buporpion and diazepam     DVT PPx: Enoxaparin Diet: Carb modified Consultants: None Code Status: DNR  Medical decision making: What exists of the patient's previous chart was reviewed in depth and the case was discussed with Dr. Jimmey Ralph and Dr. Rhunette Croft. Patient seen 10:37 PM on 11/07/2015.  Disposition Plan:  Admit to step down.  IV fluids and antibiotics.  MRI and follow blood cultures.        Alberteen Sam [facility] Pager [phone]

## 2015-11-07 NOTE — ED Notes (Addendum)
54 [facility] with fever and acute change in mental status. normally speaks calmly and oriented. Since this morning, she has been moaning and vomiting. Recently treated for UTI. Hx suprapubic cath

## 2015-11-07 NOTE — ED Notes (Signed)
Pt SpO2 found to be 45-53% on 3L. MD Nanavati at bedside. Verbal for BiPAP given. Respiratory at bedside.

## 2015-11-08 ENCOUNTER — Encounter (HOSPITAL_COMMUNITY): Payer: Self-pay | Admitting: *Deleted

## 2015-11-08 LAB — GLUCOSE, CAPILLARY
GLUCOSE-CAPILLARY: 86 mg/dL (ref 65–99)
GLUCOSE-CAPILLARY: 196 mg/dL — AB (ref 65–99)
GLUCOSE-CAPILLARY: 199 mg/dL — AB (ref 65–99)
Glucose-Capillary: 131 mg/dL — ABNORMAL HIGH (ref 65–99)
Glucose-Capillary: 273 mg/dL — ABNORMAL HIGH (ref 65–99)

## 2015-11-08 LAB — COMPREHENSIVE METABOLIC PANEL
ALT: 14 U/L (ref 14–54)
AST: 15 U/L (ref 15–41)
Albumin: 3.2 g/dL — ABNORMAL LOW (ref 3.5–5.0)
Alkaline Phosphatase: 60 U/L (ref 38–126)
Anion gap: 4 — ABNORMAL LOW (ref 5–15)
BUN: 13 mg/dL (ref 6–20)
CHLORIDE: 100 mmol/L — AB (ref 101–111)
CO2: 38 mmol/L — AB (ref 22–32)
Calcium: 7.8 mg/dL — ABNORMAL LOW (ref 8.9–10.3)
Creatinine, Ser: 0.77 mg/dL (ref 0.44–1.00)
Glucose, Bld: 97 mg/dL (ref 65–99)
POTASSIUM: 3.5 mmol/L (ref 3.5–5.1)
SODIUM: 142 mmol/L (ref 135–145)
Total Bilirubin: 0.5 mg/dL (ref 0.3–1.2)
Total Protein: 6.6 g/dL (ref 6.5–8.1)

## 2015-11-08 LAB — CBC
HEMATOCRIT: 29.6 % — AB (ref 36.0–46.0)
HEMOGLOBIN: 8.5 g/dL — AB (ref 12.0–15.0)
MCH: 25.7 pg — ABNORMAL LOW (ref 26.0–34.0)
MCHC: 28.7 g/dL — ABNORMAL LOW (ref 30.0–36.0)
MCV: 89.4 fL (ref 78.0–100.0)
PLATELETS: 156 10*3/uL (ref 150–400)
RBC: 3.31 MIL/uL — AB (ref 3.87–5.11)
RDW: 18 % — AB (ref 11.5–15.5)
WBC: 6.2 10*3/uL (ref 4.0–10.5)

## 2015-11-08 LAB — LACTIC ACID, PLASMA
LACTIC ACID, VENOUS: 0.8 mmol/L (ref 0.5–2.0)
LACTIC ACID, VENOUS: 0.5 mmol/L (ref 0.5–2.0)

## 2015-11-08 LAB — URINE CULTURE: CULTURE: NO GROWTH

## 2015-11-08 LAB — STREP PNEUMONIAE URINARY ANTIGEN: Strep Pneumo Urinary Antigen: NEGATIVE

## 2015-11-08 LAB — MAGNESIUM: Magnesium: 1.9 mg/dL (ref 1.7–2.4)

## 2015-11-08 LAB — MRSA PCR SCREENING: MRSA BY PCR: NEGATIVE

## 2015-11-08 MED ORDER — POLYETHYLENE GLYCOL 3350 17 G PO PACK
17.0000 g | PACK | Freq: Two times a day (BID) | ORAL | Status: DC
  Administered 2015-11-10 – 2015-11-12 (×5): 17 g via ORAL
  Filled 2015-11-08 (×10): qty 1

## 2015-11-08 MED ORDER — OXYBUTYNIN CHLORIDE 5 MG PO TABS
5.0000 mg | ORAL_TABLET | Freq: Two times a day (BID) | ORAL | Status: DC
  Administered 2015-11-08 – 2015-11-12 (×10): 5 mg via ORAL
  Filled 2015-11-08 (×11): qty 1

## 2015-11-08 MED ORDER — INSULIN ASPART 100 UNIT/ML ~~LOC~~ SOLN
0.0000 [IU] | Freq: Three times a day (TID) | SUBCUTANEOUS | Status: DC
  Administered 2015-11-08 (×2): 3 [IU] via SUBCUTANEOUS
  Administered 2015-11-08 – 2015-11-10 (×5): 2 [IU] via SUBCUTANEOUS
  Administered 2015-11-10: 5 [IU] via SUBCUTANEOUS
  Administered 2015-11-11 (×2): 3 [IU] via SUBCUTANEOUS
  Administered 2015-11-12: 2 [IU] via SUBCUTANEOUS
  Administered 2015-11-12: 3 [IU] via SUBCUTANEOUS

## 2015-11-08 MED ORDER — DIAZEPAM 5 MG PO TABS
5.0000 mg | ORAL_TABLET | Freq: Two times a day (BID) | ORAL | Status: DC | PRN
  Administered 2015-11-11 – 2015-11-12 (×2): 10 mg via ORAL
  Filled 2015-11-08 (×2): qty 2

## 2015-11-08 MED ORDER — BUPROPION HCL ER (XL) 300 MG PO TB24
300.0000 mg | ORAL_TABLET | Freq: Every day | ORAL | Status: DC
  Administered 2015-11-08 – 2015-11-12 (×5): 300 mg via ORAL
  Filled 2015-11-08 (×5): qty 1

## 2015-11-08 MED ORDER — POTASSIUM CHLORIDE CRYS ER 20 MEQ PO TBCR
20.0000 meq | EXTENDED_RELEASE_TABLET | Freq: Two times a day (BID) | ORAL | Status: DC
  Administered 2015-11-08 – 2015-11-12 (×10): 20 meq via ORAL
  Filled 2015-11-08 (×10): qty 1

## 2015-11-08 MED ORDER — INSULIN ASPART 100 UNIT/ML ~~LOC~~ SOLN
0.0000 [IU] | Freq: Every day | SUBCUTANEOUS | Status: DC
  Administered 2015-11-08: 3 [IU] via SUBCUTANEOUS
  Administered 2015-11-09: 2 [IU] via SUBCUTANEOUS
  Administered 2015-11-12: 3 [IU] via SUBCUTANEOUS

## 2015-11-08 MED ORDER — DEXTROSE 5 % IV SOLN
600.0000 mg | Freq: Three times a day (TID) | INTRAVENOUS | Status: DC
  Administered 2015-11-08 (×3): 600 mg via INTRAVENOUS
  Filled 2015-11-08 (×3): qty 12

## 2015-11-08 MED ORDER — SODIUM CHLORIDE 0.9 % IJ SOLN
3.0000 mL | Freq: Two times a day (BID) | INTRAMUSCULAR | Status: DC
  Administered 2015-11-08 – 2015-11-12 (×5): 3 mL via INTRAVENOUS

## 2015-11-08 MED ORDER — PAROXETINE HCL ER 25 MG PO TB24
25.0000 mg | ORAL_TABLET | Freq: Every day | ORAL | Status: DC
  Administered 2015-11-08 – 2015-11-12 (×5): 25 mg via ORAL
  Filled 2015-11-08 (×5): qty 1

## 2015-11-08 MED ORDER — CETYLPYRIDINIUM CHLORIDE 0.05 % MT LIQD
7.0000 mL | Freq: Two times a day (BID) | OROMUCOSAL | Status: DC
  Administered 2015-11-08 – 2015-11-12 (×6): 7 mL via OROMUCOSAL

## 2015-11-08 MED ORDER — ACETAMINOPHEN 325 MG PO TABS
650.0000 mg | ORAL_TABLET | ORAL | Status: DC | PRN
Start: 2015-11-08 — End: 2015-11-12
  Administered 2015-11-11: 650 mg via ORAL
  Filled 2015-11-08 (×2): qty 2

## 2015-11-08 MED ORDER — ENOXAPARIN SODIUM 60 MG/0.6ML ~~LOC~~ SOLN
60.0000 mg | SUBCUTANEOUS | Status: DC
  Administered 2015-11-08 – 2015-11-12 (×5): 60 mg via SUBCUTANEOUS
  Filled 2015-11-08 (×5): qty 0.6

## 2015-11-08 MED ORDER — DOCUSATE SODIUM 100 MG PO CAPS
200.0000 mg | ORAL_CAPSULE | Freq: Two times a day (BID) | ORAL | Status: DC
  Administered 2015-11-08 – 2015-11-12 (×9): 200 mg via ORAL
  Filled 2015-11-08 (×10): qty 2

## 2015-11-08 MED ORDER — GABAPENTIN 300 MG PO CAPS
300.0000 mg | ORAL_CAPSULE | Freq: Two times a day (BID) | ORAL | Status: DC
  Administered 2015-11-08 – 2015-11-12 (×10): 300 mg via ORAL
  Filled 2015-11-08 (×10): qty 1

## 2015-11-08 MED ORDER — LUBIPROSTONE 24 MCG PO CAPS
24.0000 ug | ORAL_CAPSULE | Freq: Two times a day (BID) | ORAL | Status: DC
  Administered 2015-11-08 – 2015-11-12 (×9): 24 ug via ORAL
  Filled 2015-11-08 (×11): qty 1

## 2015-11-08 MED ORDER — OXYCODONE HCL 5 MG PO TABS
10.0000 mg | ORAL_TABLET | Freq: Four times a day (QID) | ORAL | Status: DC | PRN
  Administered 2015-11-08 – 2015-11-11 (×9): 10 mg via ORAL
  Filled 2015-11-08 (×9): qty 2

## 2015-11-08 MED ORDER — LEVOTHYROXINE SODIUM 125 MCG PO TABS
250.0000 ug | ORAL_TABLET | Freq: Every day | ORAL | Status: DC
  Administered 2015-11-08 – 2015-11-12 (×5): 250 ug via ORAL
  Filled 2015-11-08 (×5): qty 2
  Filled 2015-11-08: qty 1

## 2015-11-08 MED ORDER — METHADONE HCL 10 MG PO TABS
30.0000 mg | ORAL_TABLET | Freq: Three times a day (TID) | ORAL | Status: DC
  Administered 2015-11-08 – 2015-11-12 (×14): 30 mg via ORAL
  Filled 2015-11-08 (×14): qty 3

## 2015-11-08 MED ORDER — PANTOPRAZOLE SODIUM 40 MG PO TBEC
40.0000 mg | DELAYED_RELEASE_TABLET | Freq: Every day | ORAL | Status: DC
  Administered 2015-11-08 – 2015-11-12 (×5): 40 mg via ORAL
  Filled 2015-11-08 (×5): qty 1

## 2015-11-08 MED ORDER — POTASSIUM CHLORIDE IN NACL 40-0.9 MEQ/L-% IV SOLN
INTRAVENOUS | Status: DC
  Administered 2015-11-08 – 2015-11-09 (×4): 125 mL/h via INTRAVENOUS
  Filled 2015-11-08 (×6): qty 1000

## 2015-11-08 MED ORDER — HYDROCHLOROTHIAZIDE 25 MG PO TABS
25.0000 mg | ORAL_TABLET | Freq: Every day | ORAL | Status: DC
  Administered 2015-11-08: 25 mg via ORAL
  Filled 2015-11-08: qty 1

## 2015-11-08 NOTE — Progress Notes (Signed)
Lovenox per Pharmacy for DVT Prophylaxis    Pharmacy has been consulted from dosing enoxaparin (lovenox) in this patient for DVT prophylaxis.  The pharmacist has reviewed pertinent labs (Hgb _7__; PLT_141__), patient weight (_138__kg) and renal function (CrCl_>90__mL/min) and decided that enoxaparin _60_mg SQ Q_24_Hrs is appropriate for this patient.  The pharmacy department will sign off at this time.  Please reconsult pharmacy if status changes or for further issues.  Thank you  Luetta NuttingJulian Crowford Marylouise Mallet, Jr PharmD, BCPS  11/08/2015, 1:49 AM

## 2015-11-08 NOTE — Progress Notes (Signed)
[facility] PROGRESS NOTE  Karen BarrioChristy Walls WUJ:[id number] DOB: 02/13/1961 DOA: 11/07/2015 PCP: Terald SleeperOBSON,MICHAEL GAVIN, MD  Assessment/Plan: 1. Acute respiratory failure with hypoxia: associated with fever and CXR findings of pneumonia.  - possibly from health care associated pneumonia.  - normal lactic acid after fluid boluses.  -   Urine Strep antigen is negative, legionella antigen is pending.  - blood cultures are ordered and pending.  - on 3.5 [place] oxygen at baseline and currently requiring 4 lit of [place] oxygen.  - keep sats greater than 90%.  - resume fluids tokeep MAP>65.    2. Chronic indwelling supra pubic catheter: Recently completed treatment for pseudomonas UTI.  Recently changed supra pubic catheter this month.    3. Diabetes Mellitus: CBG (last 3)   Recent Labs  11/08/15 0130 11/08/15 0736 11/08/15 1216  GLUCAP 86 131* 196*    hgba1c is pending.    4. Anemia: Normocytic.    5. Hypertension: Controlled.    6. Acute encephalopathy: Probably from high fever. She became oriented ,when her fever resolved.  dicontinue acyclovir .  D/c LP  7. OSA: - on CPAP at night.      Code Status: DNR Family Communication: none at bedside.  Disposition Plan: pending resolution of the pneumonia.    Consultants:  none  Procedures:  none  Antibiotics:  Vancomycin 11/11   fortaz 11/11  Acyclovir 11/11 to 11/12  Flagyl 11/11- 11/12.  HPI/Subjective: Requesting for regular diet, .   Objective: Filed Vitals:   11/08/15 1645  BP: 117/52  Pulse: 71  Temp: 97.9 F (36.6 C)  Resp: 14    Intake/Output Summary (Last 24 hours) at 11/08/15 1734 Last data filed at 11/08/15 1600  Gross per 24 hour  Intake 9124.92 ml  Output   1750 ml  Net 7374.92 ml   Filed Weights   11/08/15 0117  Weight: 137.8 kg (303 lb 12.7 oz)    Exam:   General:  Alert and appears comfortable on 4 lit of [place] oxygen.   Cardiovascular: s1s2  Respiratory:  diminished at bases, no wheezing or rhonchi  Abdomen: soft obese, non tender non distended, bowel sounds good.   Musculoskeletal: bilateral pedal edema  Data Reviewed: Basic Metabolic Panel:  Recent Labs Lab 11/07/15 1639 11/07/15 1653 11/08/15 0142  NA 141 135 142  K 2.4* 3.8 3.5  CL 109 91* 100*  CO2 27  --  38*  GLUCOSE 96 129* 97  BUN 11 16 13   CREATININE 0.42* 0.90 0.77  CALCIUM 6.2*  --  7.8*  MG  --   --  1.9   Liver Function Tests:  Recent Labs Lab 11/07/15 1639 11/08/15 0142  AST 13* 15  ALT 11* 14  ALKPHOS 44 60  BILITOT 0.4 0.5  PROT 5.1* 6.6  ALBUMIN 2.4* 3.2*   No results for input(s): LIPASE, AMYLASE in the last 168 hours. No results for input(s): AMMONIA in the last 168 hours. CBC:  Recent Labs Lab 11/07/15 1639 11/07/15 1653 11/08/15 0142  WBC 5.4  --  6.2  NEUTROABS 4.4  --   --   HGB 7.0* 11.9* 8.5*  HCT 24.2* 35.0* 29.6*  MCV 89.0  --  89.4  PLT 141*  --  156   Cardiac Enzymes:  Recent Labs Lab 11/07/15 1640  TROPONINI <0.03   BNP (last 3 results) No results for input(s): BNP in the last 8760 hours.  ProBNP (last 3 results) No results for input(s): PROBNP in the last 8760  hours.  CBG:  Recent Labs Lab 11/07/15 1656 11/08/15 0130 11/08/15 0736 11/08/15 1216  GLUCAP 123* 86 131* 196*    Recent Results (from the past 240 hour(s))  Urine culture     Status: None   Collection Time: 11/07/15  4:11 PM  Result Value Ref Range Status   Specimen Description URINE, SUPRAPUBIC  Final   Special Requests NONE  Final   Culture   Final    NO GROWTH 1 DAY Performed at [facility]    Report Status 11/08/2015 FINAL  Final  Culture, blood (routine x 2)     Status: None (Preliminary result)   Collection Time: 11/07/15  4:30 PM  Result Value Ref Range Status   Specimen Description BLOOD LEFT ANTECUBITAL  Final   Special Requests BOTTLES DRAWN AEROBIC AND ANAEROBIC  Final   Culture   Final    NO GROWTH < 24  HOURS Performed at [facility]    Report Status PENDING  Incomplete  Culture, blood (routine x 2)     Status: None (Preliminary result)   Collection Time: 11/07/15  4:37 PM  Result Value Ref Range Status   Specimen Description BLOOD RIGHT ANTECUBITAL  Final   Special Requests BOTTLES DRAWN AEROBIC AND ANAEROBIC  Final   Culture   Final    NO GROWTH < 24 HOURS Performed at [facility]    Report Status PENDING  Incomplete  MRSA PCR Screening     Status: None   Collection Time: 11/08/15  1:35 AM  Result Value Ref Range Status   MRSA by PCR NEGATIVE NEGATIVE Final    Comment:        The GeneXpert MRSA Assay (FDA approved for NASAL specimens only), is one component of a comprehensive MRSA colonization surveillance program. It is not intended to diagnose MRSA infection nor to guide or monitor treatment for MRSA infections.      Studies: Ct Head Wo Contrast  11/07/2015  CLINICAL DATA:  Fever and acute mental status changes. EXAM: CT HEAD WITHOUT CONTRAST TECHNIQUE: Contiguous axial images were obtained from the base of the skull through the vertex without intravenous contrast. COMPARISON:  None. FINDINGS: Mild age advanced cerebral atrophy and ventriculomegaly. No extra-axial fluid collections. No CT findings for acute hemispheric infarction or intracranial hemorrhage. No mass lesions. The brainstem and cerebellum are grossly normal. No acute bony findings. No worrisome bone lesions. The paranasal sinuses and mastoid air cells are clear. Minimal scattered ethmoid disease. The globes are intact. IMPRESSION: No acute intracranial findings Electronically Signed   By: Rudie Meyer M.D.   On: 11/07/2015 17:53   Dg Abd Acute W/chest  11/07/2015  CLINICAL DATA:  54 year old nursing home patient with acute fever, mental status changes, generalized abdominal pain and vomiting. Recently treated for urinary tract infection. Indwelling suprapubic catheter. EXAM: DG ABDOMEN  ACUTE W/ 1V CHEST COMPARISON:  CT abdomen and pelvis 10/28/2015. Two-view chest x-ray 10/27/2015, 02/22/2014. FINDINGS: Moderate gaseous distension of the tortuous and redundant sigmoid colon, similar to the recent CT. Very large stool burden throughout the colon with the exception of the gas distended sigmoid, again similar to the recent CT. No evidence of free intraperitoneal air on the lateral decubitus image. No visible opaque urinary tract calculi. Phleboliths in the pelvis. Cardiac silhouette enlarged even allowing for the AP supine technique. Pulmonary vascularity normal. Airspace consolidation in the medial right lung base. Lungs otherwise clear. No pleural effusions. IMPRESSION: 1. No acute abdominal abnormality.  Very large colonic stool burden. 2. Chronic gaseous distension of the tortuous and redundant sigmoid colon which extends up into the right upper quadrant of the abdomen as noted on prior CT. 3. Acute pneumonia involving the right lung base. 4. Stable cardiomegaly without pulmonary edema. Electronically Signed   By: Hulan Saas M.D.   On: 11/07/2015 17:34   Ct Renal Stone Study  11/07/2015  CLINICAL DATA:  Fever and acute mental status changes. Patient has been moaning and vomiting. EXAM: CT ABDOMEN AND PELVIS WITHOUT CONTRAST TECHNIQUE: Multidetector CT imaging of the abdomen and pelvis was performed following the standard protocol without IV contrast. COMPARISON:  CT scan 10/28/2015 FINDINGS: Lower chest: Minimal streaky bibasilar atelectasis. No pleural effusion. The heart is borderline enlarged. No pericardial effusion. The distal esophagus is grossly normal. Hepatobiliary: No focal hepatic lesions or intrahepatic biliary dilatation. The gallbladder demonstrate suspected layering small gallstones. No findings for acute cholecystitis. No common bile duct dilatation. Pancreas: Moderate fatty atrophy of the pancreas but no mass or inflammation. Spleen: Stable mild splenomegaly.  No focal  lesions. Adrenals/Urinary Tract: The adrenal glands and kidneys are unremarkable. No renal or obstructing ureteral calculi or bladder calculi. The bladder is decompressed by a suprapubic catheter. Mild diffuse wall thickening. Stomach/Bowel: The stomach, duodenum, small bowel and colon are grossly normal without oral contrast. There is a moderate to large amount of stool throughout the colon which may suggest constipation. Stable mild muscular wall thickening of the sigmoid colon. No findings for acute diverticulitis. Vascular/Lymphatic: No mesenteric or retroperitoneal mass or adenopathy. Small scattered lymph nodes are stable. The aorta is normal in caliber. Scattered atherosclerotic calcifications. Other: Stable uterine fibroids. The ovaries are normal. Stable small scattered pelvic and inguinal lymph nodes. No mass or free fluid in the pelvis. Musculoskeletal: No significant bony findings. IMPRESSION: 1. Streaky bibasilar atelectasis but no definite infiltrates. 2. Suspect layering small gallstones in the gallbladder but no findings for acute cholecystitis. 3. Stable splenomegaly. 4. Muscular wall thickening of the sigmoid colon but no inflammatory changes. 5. Large amount of stool throughout the colon suggesting constipation. 6. Stable uterine fibroids. Electronically Signed   By: Rudie Meyer M.D.   On: 11/07/2015 18:17    Scheduled Meds: . antiseptic oral rinse  7 mL Mouth Rinse BID  . buPROPion  300 mg Oral Daily  . cefTAZidime (FORTAZ)  IV  2 g Intravenous 3 times per day  . docusate sodium  200 mg Oral BID  . enoxaparin (LOVENOX) injection  60 mg Subcutaneous Q24H  . gabapentin  300 mg Oral BID  . insulin aspart  0-15 Units Subcutaneous TID WC  . insulin aspart  0-5 Units Subcutaneous QHS  . levothyroxine  250 mcg Oral QAC breakfast  . lubiprostone  24 mcg Oral BID WC  . methadone  30 mg Oral 3 times per day  . oxybutynin  5 mg Oral BID  . pantoprazole  40 mg Oral Daily  . PARoxetine  25  mg Oral Daily  . polyethylene glycol  17 g Oral BID  . potassium chloride  20 mEq Oral BID  . sodium chloride  3 mL Intravenous Q12H  . vancomycin  1,250 mg Intravenous Q12H   Continuous Infusions: . 0.9 % NaCl with KCl 40 mEq / L 125 mL/hr (11/08/15 1201)    Principal Problem:   Sepsis (HCC) Active Problems:   Hereditary and idiopathic peripheral neuropathy   Chronic pain syndrome   Morbid obesity (HCC)   Hypothyroidism  COPD (chronic obstructive pulmonary disease) (HCC)   Essential hypertension, benign   Diabetes mellitus type 2, controlled (HCC)   HCAP (healthcare-associated pneumonia)    Time spent: 25 minutes.     [facility]  [facility] Pager [phone]. If 7PM-7AM, please contact night-coverage at [email or web address], password [facility] 11/08/2015, 5:34 PM  LOS: 1 day

## 2015-11-08 NOTE — Progress Notes (Signed)
CPAP HS order was just released. Patient will be started on CPAP tonight. RT will continue to monitor as needed.

## 2015-11-09 LAB — GLUCOSE, CAPILLARY
GLUCOSE-CAPILLARY: 106 mg/dL — AB (ref 65–99)
GLUCOSE-CAPILLARY: 144 mg/dL — AB (ref 65–99)
Glucose-Capillary: 130 mg/dL — ABNORMAL HIGH (ref 65–99)

## 2015-11-09 LAB — BASIC METABOLIC PANEL
Anion gap: 3 — ABNORMAL LOW (ref 5–15)
BUN: 10 mg/dL (ref 6–20)
CHLORIDE: 101 mmol/L (ref 101–111)
CO2: 38 mmol/L — ABNORMAL HIGH (ref 22–32)
Calcium: 8.3 mg/dL — ABNORMAL LOW (ref 8.9–10.3)
Creatinine, Ser: 0.78 mg/dL (ref 0.44–1.00)
GFR calc Af Amer: >60 mL/min (ref >60)
GFR calc non Af Amer: >60 mL/min (ref >60)
GLUCOSE: 177 mg/dL — AB (ref 65–99)
POTASSIUM: 4.1 mmol/L (ref 3.5–5.1)
Sodium: 142 mmol/L (ref 135–145)

## 2015-11-09 LAB — CBC
HEMATOCRIT: 29.8 % — AB (ref 36.0–46.0)
HEMOGLOBIN: 8.5 g/dL — AB (ref 12.0–15.0)
MCH: 26.2 pg (ref 26.0–34.0)
MCHC: 28.5 g/dL — ABNORMAL LOW (ref 30.0–36.0)
MCV: 91.7 fL (ref 78.0–100.0)
Platelets: 130 10*3/uL — ABNORMAL LOW (ref 150–400)
RBC: 3.25 MIL/uL — AB (ref 3.87–5.11)
RDW: 18.8 % — AB (ref 11.5–15.5)
WBC: 4.7 10*3/uL (ref 4.0–10.5)

## 2015-11-09 MED ORDER — SUCRALFATE 1 G PO TABS
1.0000 g | ORAL_TABLET | Freq: Three times a day (TID) | ORAL | Status: DC
  Administered 2015-11-09 – 2015-11-12 (×13): 1 g via ORAL
  Filled 2015-11-09 (×14): qty 1

## 2015-11-09 MED ORDER — BISACODYL 10 MG RE SUPP
10.0000 mg | Freq: Every day | RECTAL | Status: DC | PRN
  Administered 2015-11-09: 10 mg via RECTAL
  Filled 2015-11-09: qty 1

## 2015-11-09 MED ORDER — TRAMADOL HCL 50 MG PO TABS
50.0000 mg | ORAL_TABLET | Freq: Four times a day (QID) | ORAL | Status: DC | PRN
  Administered 2015-11-09 – 2015-11-11 (×3): 50 mg via ORAL
  Filled 2015-11-09 (×3): qty 1

## 2015-11-09 MED ORDER — ALUM & MAG HYDROXIDE-SIMETH 200-200-20 MG/5ML PO SUSP
30.0000 mL | Freq: Four times a day (QID) | ORAL | Status: DC | PRN
  Administered 2015-11-09: 30 mL via ORAL
  Filled 2015-11-09: qty 30

## 2015-11-09 MED ORDER — TRAZODONE HCL 100 MG PO TABS
100.0000 mg | ORAL_TABLET | Freq: Every day | ORAL | Status: DC
  Administered 2015-11-09 – 2015-11-11 (×3): 100 mg via ORAL
  Filled 2015-11-09 (×3): qty 1

## 2015-11-09 NOTE — Progress Notes (Signed)
[facility] PROGRESS NOTE  [name] ZOX:[id number] DOB: [birth year] DOA: 11/07/2015 PCP: Terald Sleeper, MD  Assessment/Plan: 1. Acute respiratory failure with hypoxia: associated with fever and CXR findings of pneumonia.  - possibly from health care associated pneumonia.  - normal lactic acid after fluid boluses.  -   Urine Strep antigen is negative, legionella antigen is pending.  - blood cultures are ordered and pending.  - on 3.5 [place] oxygen at baseline and currently requiring 4 lit of [place] oxygen.  - keep sats greater than 90%.     2. Chronic indwelling supra pubic catheter: Recently completed treatment for pseudomonas UTI.  Recently changed supra pubic catheter this month.    3. Diabetes Mellitus: CBG (last 3)   Recent Labs  11/09/15 0746 11/09/15 1116 11/09/15 1714  GLUCAP 130* 106* 144*    hgba1c is pending.    4. Anemia: Normocytic.    5. Hypertension: Controlled.    6. Acute encephalopathy: Probably from high fever. She became oriented ,when her fever resolved.  dicontinue acyclovir  AND MRI BRAIN.  D/c LP  7. OSA: - on CPAP at night.   8. Constipation: Stool softeners and miralax twice a day.      Code Status: DNR Family Communication: none at bedside.  Disposition Plan:  Back to snf pending resolution of the pneumonia.    Consultants:  none  Procedures:  none  Antibiotics:  Vancomycin 11/11   fortaz 11/11  Acyclovir 11/11 to 11/12  Flagyl 11/11- 11/12.  HPI/Subjective: Reports pain all over and lower abd cramps. No bm FOR 2 DAYS.   Objective: Filed Vitals:   11/09/15 1300  BP: 150/67  Pulse: 70  Temp: 98.3 F (36.8 C)  Resp:     Intake/Output Summary (Last 24 hours) at 11/09/15 1731 Last data filed at 11/09/15 1529  Gross per 24 hour  Intake 3766.67 ml  Output   2800 ml  Net 966.67 ml   Filed Weights   11/08/15 0117  Weight: 137.8 kg (303 lb 12.7 oz)    Exam:   General:  Alert and  appears comfortable on 4 lit of [place] oxygen.   Cardiovascular: s1s2  Respiratory: diminished at bases, no wheezing or rhonchi  Abdomen: soft obese, non tender non distended, bowel sounds good.   Musculoskeletal: bilateral pedal edema  Data Reviewed: Basic Metabolic Panel:  Recent Labs Lab 11/07/15 1639 11/07/15 1653 11/08/15 0142 11/09/15 0525  NA 141 135 142 142  K 2.4* 3.8 3.5 4.1  CL 109 91* 100* 101  CO2 27  --  38* 38*  GLUCOSE 96 129* 97 177*  BUN CREATININE 0.42* 0.90 0.77 0.78  CALCIUM 6.2*  --  7.8* 8.3*  MG  --   --  1.9  --    Liver Function Tests:  Recent Labs Lab 11/07/15 1639 11/08/15 0142  AST 13* 15  ALT 11* 14  ALKPHOS 44 60  BILITOT 0.4 0.5  PROT 5.1* 6.6  ALBUMIN 2.4* 3.2*   No results for input(s): LIPASE, AMYLASE in the last 168 hours. No results for input(s): AMMONIA in the last 168 hours. CBC:  Recent Labs Lab 11/07/15 1639 11/07/15 1653 11/08/15 0142 11/09/15 0525  WBC 5.4  --  6.2 4.7  NEUTROABS 4.4  --   --   --   HGB 7.0* 11.9* 8.5* 8.5*  HCT 24.2* 35.0* 29.6* 29.8*  MCV 89.0  --  89.4 91.7  PLT 141*  --  156  130*   Cardiac Enzymes:  Recent Labs Lab 11/07/15 1640  TROPONINI <0.03   BNP (last 3 results) No results for input(s): BNP in the last 8760 hours.  ProBNP (last 3 results) No results for input(s): PROBNP in the last 8760 hours.  CBG:  Recent Labs Lab 11/08/15 1557 11/08/15 2140 11/09/15 0746 11/09/15 1116 11/09/15 1714  GLUCAP 199* 273* 130* 106* 144*    Recent Results (from the past 240 hour(s))  Urine culture     Status: None   Collection Time: 11/07/15  4:11 PM  Result Value Ref Range Status   Specimen Description URINE, SUPRAPUBIC  Final   Special Requests NONE  Final   Culture   Final    NO GROWTH 1 DAY Performed at [facility]    Report Status 11/08/2015 FINAL  Final  Culture, blood (routine x 2)     Status: None (Preliminary result)   Collection Time: 11/07/15   4:30 PM  Result Value Ref Range Status   Specimen Description BLOOD LEFT ANTECUBITAL  Final   Special Requests BOTTLES DRAWN AEROBIC AND ANAEROBIC  Final   Culture   Final    NO GROWTH 2 DAYS Performed at [facility]    Report Status PENDING  Incomplete  Culture, blood (routine x 2)     Status: None (Preliminary result)   Collection Time: 11/07/15  4:37 PM  Result Value Ref Range Status   Specimen Description BLOOD RIGHT ANTECUBITAL  Final   Special Requests BOTTLES DRAWN AEROBIC AND ANAEROBIC  Final   Culture   Final    NO GROWTH 2 DAYS Performed at [facility]    Report Status PENDING  Incomplete  MRSA PCR Screening     Status: None   Collection Time: 11/08/15  1:35 AM  Result Value Ref Range Status   MRSA by PCR NEGATIVE NEGATIVE Final    Comment:        The GeneXpert MRSA Assay (FDA approved for NASAL specimens only), is one component of a comprehensive MRSA colonization surveillance program. It is not intended to diagnose MRSA infection nor to guide or monitor treatment for MRSA infections.      Studies: Ct Head Wo Contrast  11/07/2015  CLINICAL DATA:  Fever and acute mental status changes. EXAM: CT HEAD WITHOUT CONTRAST TECHNIQUE: Contiguous axial images were obtained from the base of the skull through the vertex without intravenous contrast. COMPARISON:  None. FINDINGS: Mild age advanced cerebral atrophy and ventriculomegaly. No extra-axial fluid collections. No CT findings for acute hemispheric infarction or intracranial hemorrhage. No mass lesions. The brainstem and cerebellum are grossly normal. No acute bony findings. No worrisome bone lesions. The paranasal sinuses and mastoid air cells are clear. Minimal scattered ethmoid disease. The globes are intact. IMPRESSION: No acute intracranial findings Electronically Signed   By: Rudie Meyer M.D.   On: 11/07/2015 17:53   Ct Renal Stone Study  11/07/2015  CLINICAL DATA:  Fever and acute mental  status changes. Patient has been moaning and vomiting. EXAM: CT ABDOMEN AND PELVIS WITHOUT CONTRAST TECHNIQUE: Multidetector CT imaging of the abdomen and pelvis was performed following the standard protocol without IV contrast. COMPARISON:  CT scan 10/28/2015 FINDINGS: Lower chest: Minimal streaky bibasilar atelectasis. No pleural effusion. The heart is borderline enlarged. No pericardial effusion. The distal esophagus is grossly normal. Hepatobiliary: No focal hepatic lesions or intrahepatic biliary dilatation. The gallbladder demonstrate suspected layering small gallstones. No findings for acute cholecystitis. No common  bile duct dilatation. Pancreas: Moderate fatty atrophy of the pancreas but no mass or inflammation. Spleen: Stable mild splenomegaly.  No focal lesions. Adrenals/Urinary Tract: The adrenal glands and kidneys are unremarkable. No renal or obstructing ureteral calculi or bladder calculi. The bladder is decompressed by a suprapubic catheter. Mild diffuse wall thickening. Stomach/Bowel: The stomach, duodenum, small bowel and colon are grossly normal without oral contrast. There is a moderate to large amount of stool throughout the colon which may suggest constipation. Stable mild muscular wall thickening of the sigmoid colon. No findings for acute diverticulitis. Vascular/Lymphatic: No mesenteric or retroperitoneal mass or adenopathy. Small scattered lymph nodes are stable. The aorta is normal in caliber. Scattered atherosclerotic calcifications. Other: Stable uterine fibroids. The ovaries are normal. Stable small scattered pelvic and inguinal lymph nodes. No mass or free fluid in the pelvis. Musculoskeletal: No significant bony findings. IMPRESSION: 1. Streaky bibasilar atelectasis but no definite infiltrates. 2. Suspect layering small gallstones in the gallbladder but no findings for acute cholecystitis. 3. Stable splenomegaly. 4. Muscular wall thickening of the sigmoid colon but no inflammatory  changes. 5. Large amount of stool throughout the colon suggesting constipation. 6. Stable uterine fibroids. Electronically Signed   By: Rudie MeyerP.  Gallerani M.D.   On: 11/07/2015 18:17    Scheduled Meds: . antiseptic oral rinse  7 mL Mouth Rinse BID  . buPROPion  300 mg Oral Daily  . cefTAZidime (FORTAZ)  IV  2 g Intravenous 3 times per day  . docusate sodium  200 mg Oral BID  . enoxaparin (LOVENOX) injection  60 mg Subcutaneous Q24H  . gabapentin  300 mg Oral BID  . insulin aspart  0-15 Units Subcutaneous TID WC  . insulin aspart  0-5 Units Subcutaneous QHS  . levothyroxine  250 mcg Oral QAC breakfast  . lubiprostone  24 mcg Oral BID WC  . methadone  30 mg Oral 3 times per day  . oxybutynin  5 mg Oral BID  . pantoprazole  40 mg Oral Daily  . PARoxetine  25 mg Oral Daily  . polyethylene glycol  17 g Oral BID  . potassium chloride  20 mEq Oral BID  . sodium chloride  3 mL Intravenous Q12H  . sucralfate  1 g Oral TID WC & HS  . traZODone  100 mg Oral QHS  . vancomycin  1,250 mg Intravenous Q12H   Continuous Infusions: . 0.9 % NaCl with KCl 40 mEq / L 50 mL/hr (11/09/15 1130)    Principal Problem:   Sepsis (HCC) Active Problems:   Hereditary and idiopathic peripheral neuropathy   Chronic pain syndrome   Morbid obesity (HCC)   Hypothyroidism   COPD (chronic obstructive pulmonary disease) (HCC)   Essential hypertension, benign   Diabetes mellitus type 2, controlled (HCC)   HCAP (healthcare-associated pneumonia)    Time spent: 25 minutes.     [facility],Kanye Depree  [facility] Pager [phone]. If 7PM-7AM, please contact night-coverage at [email or web address], password [email or web address] 11/09/2015, 5:31 PM  LOS: 2 days

## 2015-11-09 NOTE — Progress Notes (Signed)
Patient returned to [facility] today after extensive discussions with patient and facility via EMS.  Patient did not want to return to SNF- she wanted CSW to locate independent housing for her and arrange for CAP services at home.  CSW spoke multiple times with patient about this and attempted to explain that this was not something that could be arranged during her hospitalization. Patient verbalized frustration and anger that she was in a nursing center and does not feel that this is where she she live- however, she acknowledges that she does not have any home options at this time.  Patient states that her family is attempting to locate housing for her and feels that "within 30-60 days" they will get her an apartment. She plans to have CAP services on board for 8 hours a day and feels that she can manage the rest of the day (although nursing indicated that patient basically was unable to assist at all in her ADL's.)  Patient is very unhappy with life in the nursing center and CSW offered to locate other facilities for her; [facility] and no other facilities offered for her.  Patient agrees to return to [facility] however- she wants to continue attempts to obtain independent housing. CSW suggested that she work with the facility SW on this endeaver.   CSW spoke multiple times with admissions at [facility]. They are aware of patient's unhappiness with SNF and state that they have worked extensively to seek other housing arrangements for patient. Prior to admission- the facility changed her status to ALF and ALF PASARR number was obtained with plan to d/c patient to an ALF in another county.  PT recommendations- however- continue to recommend SNF placement for patient and new PASARR number was obtained for SNF short term. Facility is aware.   Nursing notified to call report to SNF and EMS arranged.  Patient stated that she would keep her family notified of her return to the  facility..  No further CSW needs identified. CSW signing off.  [name], [place]CS[place] [phone]

## 2015-11-10 ENCOUNTER — Inpatient Hospital Stay (HOSPITAL_COMMUNITY): Payer: Medicare Other

## 2015-11-10 LAB — URINALYSIS, ROUTINE W REFLEX MICROSCOPIC
BILIRUBIN URINE: NEGATIVE
GLUCOSE, UA: NEGATIVE mg/dL
HGB URINE DIPSTICK: NEGATIVE
Ketones, ur: NEGATIVE mg/dL
Leukocytes, UA: NEGATIVE
Nitrite: NEGATIVE
PH: 8 (ref 5.0–8.0)
Protein, ur: NEGATIVE mg/dL
SPECIFIC GRAVITY, URINE: 1.014 (ref 1.005–1.030)
UROBILINOGEN UA: 1 mg/dL (ref 0.0–1.0)

## 2015-11-10 LAB — GLUCOSE, CAPILLARY
GLUCOSE-CAPILLARY: 227 mg/dL — AB (ref 65–99)
Glucose-Capillary: 208 mg/dL — ABNORMAL HIGH (ref 65–99)
Glucose-Capillary: 128 mg/dL — ABNORMAL HIGH (ref 65–99)
Glucose-Capillary: 124 mg/dL — ABNORMAL HIGH (ref 65–99)
Glucose-Capillary: 164 mg/dL — ABNORMAL HIGH (ref 65–99)

## 2015-11-10 LAB — LEGIONELLA PNEUMOPHILA SEROGP 1 UR AG: L. PNEUMOPHILA SEROGP 1 UR AG: NEGATIVE

## 2015-11-10 LAB — HEMOGLOBIN A1C
Hgb A1c MFr Bld: 6.6 % — ABNORMAL HIGH (ref 4.8–5.6)
Mean Plasma Glucose: 143 mg/dL

## 2015-11-10 NOTE — Progress Notes (Signed)
[facility] PROGRESS NOTE  Theotis BarrioChristy Dickie ZOX:[id number] DOB: 06/22/1961 DOA: 11/07/2015 PCP: Terald SleeperOBSON,MICHAEL GAVIN, MD  Assessment/Plan: 1. Acute respiratory failure with hypoxia: associated with fever and CXR findings of pneumonia.  - possibly from health care associated pneumonia.  - normal lactic acid after fluid boluses.  -   Urine Strep antigen is negative, legionella antigen is negative.  - blood cultures are ordered and pending.  - on 3.5 [place] oxygen at baseline and currently requiring 4 lit of [place] oxygen.  - keep sats greater than 90%.  Repeat CXR in am.     2. Chronic indwelling supra pubic catheter: Recently completed treatment for pseudomonas UTI.  Recently changed supra pubic catheter this month.  Pt reported some supra pubic pain and spasms and repeat UA was negative for infection.    3. Diabetes Mellitus: CBG (last 3)   Recent Labs  11/10/15 0727 11/10/15 1141 11/10/15 1708  GLUCAP 128* 227* 124*    hgba1c is 6.6    4. Anemia: Normocytic.    5. Hypertension: Controlled.    6. Acute encephalopathy: Probably from high fever. She became oriented ,when her fever resolved.  dicontinue acyclovir  AND MRI BRAIN.  D/c LP  7. OSA: - on CPAP at night.   8. Constipation: Stool softeners and miralax twice a day.  Dulcolax suppository.      Code Status: DNR Family Communication: none at bedside.  Disposition Plan:  Back to snf pending resolution of the pneumonia.    Consultants:  none  Procedures:  none  Antibiotics:  Vancomycin 11/11   fortaz 11/11  Acyclovir 11/11 to 11/12  Flagyl 11/11- 11/12.  HPI/Subjective: bm yesterday, social issues and social worker consulted.   Objective: Filed Vitals:   11/10/15 1458  BP: 163/75  Pulse: 67  Temp: 98.7 F (37.1 C)  Resp: 20    Intake/Output Summary (Last 24 hours) at 11/10/15 1832 Last data filed at 11/10/15 1459  Gross per 24 hour  Intake   1520 ml  Output   5050 ml   Net  -3530 ml   Filed Weights   11/08/15 0117  Weight: 137.8 kg (303 lb 12.7 oz)    Exam:   General:  Alert and appears comfortable on 4 lit of [place] oxygen.   Cardiovascular: s1s2  Respiratory: diminished at bases, no wheezing or rhonchi  Abdomen: soft obese, non tender non distended, bowel sounds good.   Musculoskeletal: bilateral pedal edema  Data Reviewed: Basic Metabolic Panel:  Recent Labs Lab 11/07/15 1639 11/07/15 1653 11/08/15 0142 11/09/15 0525  NA 141 135 142 142  K 2.4* 3.8 3.5 4.1  CL 109 91* 100* 101  CO2 27  --  38* 38*  GLUCOSE 96 129* 97 177*  BUN 11 16 13 10   CREATININE 0.42* 0.90 0.77 0.78  CALCIUM 6.2*  --  7.8* 8.3*  MG  --   --  1.9  --    Liver Function Tests:  Recent Labs Lab 11/07/15 1639 11/08/15 0142  AST 13* 15  ALT 11* 14  ALKPHOS 44 60  BILITOT 0.4 0.5  PROT 5.1* 6.6  ALBUMIN 2.4* 3.2*   No results for input(s): LIPASE, AMYLASE in the last 168 hours. No results for input(s): AMMONIA in the last 168 hours. CBC:  Recent Labs Lab 11/07/15 1639 11/07/15 1653 11/08/15 0142 11/09/15 0525  WBC 5.4  --  6.2 4.7  NEUTROABS 4.4  --   --   --   HGB 7.0* 11.9* 8.5*  8.5*  HCT 24.2* 35.0* 29.6* 29.8*  MCV 89.0  --  89.4 91.7  PLT 141*  --  156 130*   Cardiac Enzymes:  Recent Labs Lab 11/07/15 1640  TROPONINI <0.03   BNP (last 3 results) No results for input(s): BNP in the last 8760 hours.  ProBNP (last 3 results) No results for input(s): PROBNP in the last 8760 hours.  CBG:  Recent Labs Lab 11/09/15 1116 11/09/15 1714 11/10/15 0727 11/10/15 1141 11/10/15 1708  GLUCAP 106* 144* 128* 227* 124*    Recent Results (from the past 240 hour(s))  Urine culture     Status: None   Collection Time: 11/07/15  4:11 PM  Result Value Ref Range Status   Specimen Description URINE, SUPRAPUBIC  Final   Special Requests NONE  Final   Culture   Final    NO GROWTH 1 DAY Performed at [facility]    Report Status  11/08/2015 FINAL  Final  Culture, blood (routine x 2)     Status: None (Preliminary result)   Collection Time: 11/07/15  4:30 PM  Result Value Ref Range Status   Specimen Description BLOOD LEFT ANTECUBITAL  Final   Special Requests BOTTLES DRAWN AEROBIC AND ANAEROBIC  Final   Culture   Final    NO GROWTH 3 DAYS Performed at [facility]    Report Status PENDING  Incomplete  Culture, blood (routine x 2)     Status: None (Preliminary result)   Collection Time: 11/07/15  4:37 PM  Result Value Ref Range Status   Specimen Description BLOOD RIGHT ANTECUBITAL  Final   Special Requests BOTTLES DRAWN AEROBIC AND ANAEROBIC  Final   Culture   Final    NO GROWTH 3 DAYS Performed at [facility]    Report Status PENDING  Incomplete  MRSA PCR Screening     Status: None   Collection Time: 11/08/15  1:35 AM  Result Value Ref Range Status   MRSA by PCR NEGATIVE NEGATIVE Final    Comment:        The GeneXpert MRSA Assay (FDA approved for NASAL specimens only), is one component of a comprehensive MRSA colonization surveillance program. It is not intended to diagnose MRSA infection nor to guide or monitor treatment for MRSA infections.      Studies: Dg Chest 2 View  11/10/2015  CLINICAL DATA:  Short of breath.  Chest tightness.  History of COPD. EXAM: CHEST  2 VIEW COMPARISON:  11/07/2015 FINDINGS: Frontal view is limited by decreased penetration. On for this, cardiac silhouette is mildly enlarged. There is no convincing mediastinal or hilar mass. There is vascular congestion and mild interstitial thickening, best seen on the lateral view, more on the left than the right. No definite pleural effusion. No pneumothorax. Bony thorax is grossly intact. IMPRESSION: Limited exam. There may be a mild degree of congestive heart failure. No convincing pneumonia. Electronically Signed   By: Amie Portland M.D.   On: 11/10/2015 12:57    Scheduled Meds: . antiseptic oral rinse  7 mL  Mouth Rinse BID  . buPROPion  300 mg Oral Daily  . cefTAZidime (FORTAZ)  IV  2 g Intravenous 3 times per day  . docusate sodium  200 mg Oral BID  . enoxaparin (LOVENOX) injection  60 mg Subcutaneous Q24H  . gabapentin  300 mg Oral BID  . insulin aspart  0-15 Units Subcutaneous TID WC  . insulin aspart  0-5 Units Subcutaneous QHS  .  levothyroxine  250 mcg Oral QAC breakfast  . lubiprostone  24 mcg Oral BID WC  . methadone  30 mg Oral 3 times per day  . oxybutynin  5 mg Oral BID  . pantoprazole  40 mg Oral Daily  . PARoxetine  25 mg Oral Daily  . polyethylene glycol  17 g Oral BID  . potassium chloride  20 mEq Oral BID  . sodium chloride  3 mL Intravenous Q12H  . sucralfate  1 g Oral TID WC & HS  . traZODone  100 mg Oral QHS  . vancomycin  1,250 mg Intravenous Q12H   Continuous Infusions:    Principal Problem:   Sepsis (HCC) Active Problems:   Hereditary and idiopathic peripheral neuropathy   Chronic pain syndrome   Morbid obesity (HCC)   Hypothyroidism   COPD (chronic obstructive pulmonary disease) (HCC)   Essential hypertension, benign   Diabetes mellitus type 2, controlled (HCC)   HCAP (healthcare-associated pneumonia)    Time spent: 25 minutes.     [facility]  [facility] Pager [phone]. If 7PM-7AM, please contact night-coverage at [email or web address], password [facility] 11/10/2015, 6:32 PM  LOS: 3 days

## 2015-11-10 NOTE — Care Management Important Message (Signed)
Important Message  Patient Details IM Letter given to [name]/Case Manager to present to Patient.Important Message  Patient Details  Name: [name] MRN: [id number] Date of Birth: 09/23/1961   Medicare Important Message Given:  Yes    [name], Amyrie Illingworth 11/10/2015, 11:43 AM Name: Karen BarrioChristy Walls MRN: [id number] Date of Birth: 04/29/1961   Medicare Important Message Given:  Yes    [name], Unity Luepke 11/10/2015, 11:43 AM

## 2015-11-10 NOTE — Care Management Note (Signed)
Case Management Note  Patient Details  Name: Theotis BarrioChristy Eisner MRN: [id number] Date of Birth: 10/11/1961  Subjective/Objective:    54 y/o f admitted w/Acute resp failure. Readmit-UTI.From SNF-[facility]. CSW following. Spoke to patient about d/c plan-patient informed me that SNF-[facility]-will d/c her on 11/29, she needs passar to assess level of care-CSW notified.               Action/Plan:d/c plan return back to SNF.   Expected Discharge Date:                 Expected Discharge Plan:  Skilled Nursing Facility  In-House Referral:  Clinical Social Work  Discharge planning Services  CM Consult  Post Acute Care Choice:    Choice offered to:     DME Arranged:    DME Agency:     HH Arranged:    HH Agency:     Status of Service:  In process, will continue to follow  Medicare Important Message Given:  Yes Date Medicare IM Given:    Medicare IM give by:    Date Additional Medicare IM Given:    Additional Medicare Important Message give by:     If discussed at Long Length of Stay Meetings, dates discussed:    Additional Comments:  Lanier ClamMahabir, Elease Swarm, RN 11/10/2015, 9:29 PM

## 2015-11-10 NOTE — Progress Notes (Signed)
ANTIBIOTIC CONSULT NOTE -follow up  Pharmacy Consult for Vancomycin / Ceftazidime Indication: HCAP  Allergies  Allergen Reactions  . Ibuprofen Other (See Comments) and Shortness Of Breath  . Sulfa Antibiotics Other (See Comments), Itching and Shortness Of Breath  . Levofloxacin Nausea Only    Other fluoroquinolones OK.  . Lisinopril Other (See Comments)  . Penicillins Other (See Comments) and Hives    Patient Measurements: Height:  (165.1 cm) Weight: (!) 303 lb 12.7 oz (137.8 kg) IBW/kg (Calculated) : 57 Adjusted Body Weight:   Vital Signs: Temp: 98.7 F (37.1 C) (11/14 0517) Temp Source: Axillary (11/14 0517) BP: 137/59 mmHg (11/14 0517) Pulse Rate: 70 (11/14 0517) Intake/Output from previous day: 11/13 0701 - 11/14 0700 In: 1891.7 [P.O.:480; I.V.:761.7; IV Piggyback:650] Out: 4350 [Urine:4350] Intake/Output from this shift: Total I/O In: 360 [P.O.:360] Out: 1000 [Urine:1000]  Labs:  Recent Labs  11/07/15 1639 11/07/15 1653 11/08/15 0142 11/09/15 0525  WBC 5.4  --  6.2 4.7  HGB 7.0* 11.9* 8.5* 8.5*  PLT 141*  --  156 130*  CREATININE 0.42* 0.90 0.77 0.78   Estimated Creatinine Clearance: 113.3 mL/min (by C-G formula based on Cr of 0.78). No results for input(s): VANCOTROUGH, VANCOPEAK, VANCORANDOM, GENTTROUGH, GENTPEAK, GENTRANDOM, TOBRATROUGH, TOBRAPEAK, TOBRARND, AMIKACINPEAK, AMIKACINTROU, AMIKACIN in the last 72 hours.   Microbiology: Recent Results (from the past 720 hour(s))  MRSA PCR Screening     Status: Abnormal   Collection Time: 10/28/15  5:17 AM  Result Value Ref Range Status   MRSA by PCR POSITIVE (A) NEGATIVE Final    Comment:        The GeneXpert MRSA Assay (FDA approved for NASAL specimens only), is one component of a comprehensive MRSA colonization surveillance program. It is not intended to diagnose MRSA infection nor to guide or monitor treatment for MRSA infections. RESULT CALLED TO, READ BACK BY AND VERIFIED WITH: Jeani Sow RN BY A. DAVIS 10/28/15 AT 0800   Urine culture     Status: None   Collection Time: 10/28/15  3:10 PM  Result Value Ref Range Status   Specimen Description URINE, SUPRAPUBIC  Final   Special Requests NONE  Final   Culture   Final    >=100,000 COLONIES/mL PSEUDOMONAS AERUGINOSA 50,000 COLONIES/mL PROVIDENCIA STUARTII    Report Status 10/31/2015 FINAL  Final   Organism ID, Bacteria PSEUDOMONAS AERUGINOSA  Final   Organism ID, Bacteria PROVIDENCIA STUARTII  Final      Susceptibility   Pseudomonas aeruginosa - MIC*    CEFTAZIDIME 4 SENSITIVE Sensitive     CIPROFLOXACIN <=0.25 SENSITIVE Sensitive     GENTAMICIN 4 SENSITIVE Sensitive     IMIPENEM 2 SENSITIVE Sensitive     PIP/TAZO 16 SENSITIVE Sensitive     CEFEPIME 2 SENSITIVE Sensitive     * >=100,000 COLONIES/mL PSEUDOMONAS AERUGINOSA   Providencia stuartii - MIC*    AMPICILLIN >=32 RESISTANT Resistant     CEFAZOLIN >=64 RESISTANT Resistant     CEFTRIAXONE <=1 SENSITIVE Sensitive     CIPROFLOXACIN >=4 RESISTANT Resistant     GENTAMICIN 2 RESISTANT Resistant     IMIPENEM 2 SENSITIVE Sensitive     NITROFURANTOIN >=512 RESISTANT Resistant     TRIMETH/SULFA >=320 RESISTANT Resistant     AMPICILLIN/SULBACTAM >=32 RESISTANT Resistant     PIP/TAZO <=4 SENSITIVE Sensitive     * 50,000 COLONIES/mL PROVIDENCIA STUARTII  Urine culture     Status: None   Collection Time: 11/07/15  4:11 PM  Result Value  Ref Range Status   Specimen Description URINE, SUPRAPUBIC  Final   Special Requests NONE  Final   Culture   Final    NO GROWTH 1 DAY [facility]    Report Status 11/08/2015 FINAL  Final  Culture, blood (routine x 2)     Status: None (Preliminary result)   Collection Time: 11/07/15  4:30 PM  Result Value Ref Range Status   Specimen Description BLOOD LEFT ANTECUBITAL  Final   Special Requests BOTTLES DRAWN AEROBIC AND ANAEROBIC 5ML  Final   Culture   Final    NO GROWTH 2 DAYS [facility]    Report Status PENDING  Incomplete  Culture, blood (routine x 2)     Status: None (Preliminary result)   Collection Time: 11/07/15  4:37 PM  Result Value Ref Range Status   Specimen Description BLOOD RIGHT ANTECUBITAL  Final   Special Requests BOTTLES DRAWN AEROBIC AND ANAEROBIC 5ML  Final   Culture   Final    NO GROWTH 2 DAYS [facility]    Report Status PENDING  Incomplete  MRSA PCR Screening     Status: None   Collection Time: 11/08/15  1:35 AM  Result Value Ref Range Status   MRSA by PCR NEGATIVE NEGATIVE Final    Comment:        The GeneXpert MRSA Assay (FDA approved for NASAL specimens only), is one component of a comprehensive MRSA colonization surveillance program. It is not intended to diagnose MRSA infection nor to guide or monitor treatment for MRSA infections.     Medical History: Past Medical History  Diagnosis Date  . Diabetes mellitus without complication (HCC)   . Chronic pain   . Morbid obesity (HCC)   . OSA (obstructive sleep apnea)   . Generalized weakness   . Fibromyalgia   . Lyme disease   . Anemia   . History of blood transfusion     pt states she has had 5 blood transfusions  . Sleep disorder   . UTI (lower urinary tract infection) 02/22/2014  . Hypertension   . Hypothyroidism   . COPD (chronic obstructive pulmonary disease) (HCC)   . Shortness of breath   . GERD (gastroesophageal reflux disease)    Assessment: [age] yoF with hx T2DM, hypothyroidism, OSA, chronic pain, depression, chronic indwelling foley and recent hospitalization for pseudomonas UTI still taking ciprofloxacin and cefpodoxime PTA presents from NH with fever and AMS. Found to have Tm 104.8 in ED with elevated lactic acid. CXR shows acute PNA in right lung base. Pharmacy consulted to start vancomycin and ceftazidime for HCAP. Noted allergies.   Anti-infectives 11/11 >> Vancomycin >> 11/11 >> Ceftazidime >>   Vitals/Labs WBC: WNL Tm24h:  AF Renal: SCr 0.78, CrCl >100 CG/93N  Cultures 11/11 bloodx2:NGTD 11/11 urine: NGF  11/1 urine: >100K pseudomonas (pansens), >50K providencia (R-amp, unasyn, ancef, cipro, gent, macrobid, bactrim, S ceftriaxone, imipenem, zosyn)  Goal of Therapy:  Vancomycin trough level 15-20 mcg/ml  Plan:  Continue Ceftazidime 2g IV q8h Continue Vancomycin 1250mg  IV q12h F/u renal function, cultures, clinical course   Arley Phenixllen Darlyne Schmiesing RPh 11/10/2015, 1:08 PM Pager [phone]

## 2015-11-11 ENCOUNTER — Inpatient Hospital Stay (HOSPITAL_COMMUNITY): Payer: Medicare Other

## 2015-11-11 LAB — GLUCOSE, CAPILLARY
GLUCOSE-CAPILLARY: 187 mg/dL — AB (ref 65–99)
GLUCOSE-CAPILLARY: 119 mg/dL — AB (ref 65–99)
Glucose-Capillary: 183 mg/dL — ABNORMAL HIGH (ref 65–99)
Glucose-Capillary: 169 mg/dL — ABNORMAL HIGH (ref 65–99)

## 2015-11-11 MED ORDER — CEFPODOXIME PROXETIL 200 MG PO TABS: 200.0000 mg | ORAL_TABLET | Freq: Two times a day (BID) | ORAL | Status: DC

## 2015-11-11 MED ORDER — OXYCODONE HCL 10 MG PO TABS: ORAL_TABLET | ORAL | Status: DC

## 2015-11-11 MED ORDER — DIAZEPAM 5 MG PO TABS: 5.0000 mg | ORAL_TABLET | Freq: Two times a day (BID) | ORAL | Status: DC | PRN

## 2015-11-11 MED ORDER — FUROSEMIDE 20 MG PO TABS
20.0000 mg | ORAL_TABLET | Freq: Once | ORAL | Status: AC
  Administered 2015-11-11: 20 mg via ORAL
  Filled 2015-11-11: qty 1

## 2015-11-11 MED ORDER — CEFPODOXIME PROXETIL 200 MG PO TABS
200.0000 mg | ORAL_TABLET | Freq: Two times a day (BID) | ORAL | Status: DC
  Administered 2015-11-11 – 2015-11-12 (×2): 200 mg via ORAL
  Filled 2015-11-11 (×2): qty 1

## 2015-11-11 NOTE — Progress Notes (Addendum)
Patient ID: [name], female   DOB: 1961-08-19, 54 y.o.   MRN: [id number]                HISTORY & PHYSICAL  DATE:  11/04/2015        FACILITY: [facility]                 LEVEL OF CARE:   SNF   CHIEF COMPLAINT:  Readmission to the facility, post stay at [facility], 10/27/2015 through 10/31/2015.     HISTORY OF PRESENT ILLNESS:  [name] is a chronically ill, 54 year-old woman who actually was being sent from this nursing home to an assisted living in [place] due to the fact that the [place] people said that her current care needs did not require a nursing home.    The patient, I think, convinced the ambulance drivers to take her to [facility] ER where she complained of abdominal pain.  A CT scan of the abdomen was negative, showing only mild colitis in the sigmoid but without any other significant findings.    The patient has a chronic suprapubic catheter and is chronically colonized with gram-negatives.  She grew both Providencia and Pseudomonas.  She has a history of growing both of these organisms.    She complains of chronic lower pelvic pain and apparently has a history of chronic interstitial cystitis, although I have never been able to get information from her doctors in [place] before she arrived here two years ago.    The other issue in the hospital was major depression.  She was seen by Psychiatry.  She has a history of depression, chronic pain syndrome.   She has followed with the psychiatry service in the building for a long period of time.  In the hospital, she was apparently put on Wellbutrin and Paxil.  Her Effexor was discontinued.  She has had recent life stressors including illness in both her parents who are apparently in facilities, recent social breakup with her partner.    PAST MEDICAL HISTORY/PROBLEM LIST:            Chronic hypoxemic respiratory failure.   She has been on oxygen for years.  The exact etiology of this has never been clear.  I had wanted to  consider her for a sleep study and PFTs, although I do not know that I would be able to get her to go to this.    Chronic parotid gland enlargement.  Documented by CT scan.  I had wanted her seen by ENT, although she did not keep the appointments.    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  This is chronically colonized with gram-negative rods.    History of recurrent UTIs versus chronic colonization.  This determination is difficult clinically.  I have tried to avoid doing urine cultures on her unless there are either symptoms or signs suggestive that she has a true infection.    Type 2 diabetes with neuropathy.    Iron deficiency anemia.  This is complicated by her refusal to take iron in any form.  She had a work-up in 2015 that did not show an obvious source.    Morbid obesity with relative immobility.   She can apparently transfer from bed to a potty chair, but I am not sure what functional status she has beyond this.    History of seborrheic dermatitis.    Patient constantly complaining that she has some form of immune deficiency, although I have  never been able to document this.  She claims to have been told not to be around "old people or children", although I have no information on this.     Chronic depression.     Chronic ileus/irritable bowel syndrome with constipation.    CURRENT MEDICATIONS:  Medication list is reviewed.             Tylenol 650 q.4 hours p.r.n.     Orajel t.i.d. p.r.n.     Dulcolax 2 tablets by mouth daily.      Wellbutrin XL 300 mg daily.    Vantin 1 tablet by mouth b.i.d. for an additional 11 days.    Cipro 500 b.i.d. for an additional 11 days.     Catapres 0.1 mg p.r.n.     Valium 5-10 mg every 12 hours p.r.n.     Colace 200 b.i.d.     Neurontin 300 two times daily.     Guaifenesin 200 q.4 p.r.n.      Hydrochlorothiazide 25 daily.     Humalog sliding scale.    Novolin 70/30 insulin, 25 U b.i.d.       Synthroid 125 daily.    Amitiza 24 b.i.d.     Methadone 10 mg three times a day.    Ditropan 5 mg two times daily for bladder spasms.    Oxycodone 10 mg q.4 p.r.n.        Paxil CR 25 mg daily.     Pyridium 200 mg three times daily p.r.n. bladder pain.    MiraLAX 17 g b.i.d.      Micro-K 10 mEq daily.    Carafate 1 g four times daily.    Zanaflex 4 mg at bedtime.    Trazodone 100 mg at bedtime.    Vitamin D 50,000 U every 14 days.     SOCIAL HISTORY:  The patient came here from a facility [place] [facility] [place].  I think she was hospitalized before that.  Previously, she cohabitated with a partner.  She had a domestic breakup at that time.  She has family in the [place] area including a mother and father whom I had met on some occasions, although apparently both of them have had health issues and they are in facilities now.  Their home is not available to the patient.  I think it is going through some form of sale.  She claims to have a cousin who is working on finding her an independent apartment with a Engineer, agricultural and "money from the patient", although the facility has talked to this person who does not seem as though he is taking on all of this responsibility.  For some reason, the PASRR people have seen fit to try and push this woman out of a skilled facility.  Truthfully, I think this is probably where she needs to be.  She simply cannot live independently, even by her own admission.  She might be able to manage in an assisted living that has a rounding physician.  However, at the [facility] level, these are not all that common.  She now has a more aggressive psychiatric diagnosis from the hospital and I think applying for a Level II PASRR and nursing home care may be possible.    REVIEW OF SYSTEMS:   Really remarkably positive in this woman.   CHEST/RESPIRATORY:  She states she needs BiPAP at night like she had in [place].  She has not had BiPAP here.       CARDIAC:  No clear  chest pain.    GI:  She has had abdominal distention for some time secondary to chronic constipation that borders on ileus.  Medicaid will not pay for Amitiza.   GU:  She has chronic pelvic pain which is an everyday occurrence for this patient.  She feels that she has active bladder and kidney infection, which I have tried to avoid doing cultures on her as she consistently cultures Pseudomonas or other resistant gram-negative rods.     A complete additional review of systems was negative  PHYSICAL EXAMINATION:   VITAL SIGNS:     PULSE:  86.   RESPIRATIONS:  20.     02 SATURATIONS:  97% on 2 L.   GENERAL APPEARANCE:  The patient is, as usual, angry and confrontational HEENT:   MOUTH/THROAT:  Oral exam is normal.  I do not feel her parotid glands as enlarged as I have in the past.   LYMPHATICS:  No lymphadenopathy.    NECK/THYROID:  No thyroid.   CHEST/RESPIRATORY:  Shallow, but otherwise clear air entry.       CARDIOVASCULAR:   CARDIAC:  Heart sounds are normal.  There are no murmurs.   GASTROINTESTINAL:   ABDOMEN:  Very distended.  Bowel sounds are reduced.  No masses.  Notable for the fact that I have seen her abdomen much larger.     LIVER/SPLEEN/KIDNEY:   No liver, no spleen.   GENITOURINARY:   BLADDER:  Suprapubic cath site looks clean.    CIRCULATION:   EDEMA/VARICOSITIES:  Extremities:  Mild edema.  No evidence of a DVT.   NEUROLOGICAL:   SENSATION/STRENGTH:  She has antigravity strength.      BALANCE/GAIT:  I never really see this woman out of bed and I have never seen her ambulate, although the staff assure me that she can transfer herself from the bed to a commode chair.     PSYCHIATRIC:   MENTAL STATUS:  What I have known to be baseline for the patient.  She has a chronic depressed affect.  She is very fixed in her views on things.  Will not accept anybody else's suggestions.  Although she will often agree to a plan of care for a particular issue, she finds a way to make that  plan not attainable such as consultants, outside tests, etc.    ASSESSMENT/PLAN:                   Major depression.  Diagnosed by the psychiatrist at [facility]  This is not a new diagnosis.  It is a recurrent issue.    Chronic hypoxic respiratory failure.  I have never known her to be on BiPAP and I doubt she would be compliant with this if I were to try and arrange it.  She has not agreed to a sleep study.    Chronic iron deficiency anemia, for which she will agree to transfusions but will not agree to oral or parenteral iron.  This has been worked up in 2015 without an obvious structural cause.    Chronic ileus/constipation with irritable bowel syndrome.  Her Medicaid will not pay for Amitiza.  I have already tried this.    Chronic pain syndrome.   Initially diagnosed by prior physicians as chronic fibromyalgia.  I think this certainly is probably part of this.  She has been on methadone longstanding, adding to her constipation problems.     Recurrent UTIs.  Truthfully, I think a lot of this  is chronic colonization in a catheterized patient.  A lot of the times, her gram-negative rods are resistant; and I have really tried to avoid doing urine cultures on her unless there are obvious symptoms and signs at the bedside that make this seem likely, i.e. fever, costovertebral angle tenderness, nausea, vomiting, etc.    Chronic interstitial cystitis with chronic pelvic pain.  I had actually wanted to send her to a urologist in the past.  She has not agreed to this.    Type 2 diabetes with neuropathy.    Morbid obesity with probable obstructive sleep apnea.  As noted, she is not on a BiPAP and has never agreed to go for a sleep study.    I have spent an inordinate amount of time today discussing things with the facility social worker.  Once again, when her PASRR expires in three weeks, she will be discharged from the facility to apparently an assisted living.  I am not sure whether or not she could  actually manage at an assisted living.  I do not think this woman could manage independently.  In fact, my own personal belief is that she needs to be in a nursing home to have the structure of attending doctors and nurses.  However this issue already seems to be well beyond what I think

## 2015-11-11 NOTE — Progress Notes (Signed)
Pt is non compliant with her diet. Pt is on a carb modified diet but orders numerous desserts like brownies, ice cream and puddings. Pt had pepperoni pizza for dinner. Pt had three trays in total brought up for dinner. Pt rational for this is that she has lost everything in her life and this is the only control she has. In addition pt states she uses insulin to control her sugar.

## 2015-11-11 NOTE — Discharge Summary (Signed)
Physician Discharge Summary  Karen BarrioChristy Walls ZOX:[id number] DOB: 09/26/1961 DOA: 11/07/2015  PCP: Terald SleeperOBSON,MICHAEL GAVIN, MD  Admit date: 11/07/2015 Discharge date: 11/11/2015  Time spent: 25 minutes  Recommendations for Outpatient Follow-up:  1. Follow upw ith PCP in one week, post hospitalization visit.    Discharge Diagnoses:  Principal Problem:   Sepsis (HCC) Active Problems:   Hereditary and idiopathic peripheral neuropathy   Chronic pain syndrome   Morbid obesity (HCC)   Hypothyroidism   COPD (chronic obstructive pulmonary disease) (HCC)   Essential hypertension, benign   Diabetes mellitus type 2, controlled (HCC)   HCAP (healthcare-associated pneumonia)   Discharge Condition: improved  Diet recommendation: carb modified.   Filed Weights   11/08/15 0117  Weight: 137.8 kg (303 lb 12.7 oz)    History of present illness:  Karen BarrioChristy Teffeteller is a 54 y.o. 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  Hospital Course:  1. Acute respiratory failure with hypoxia: associated with fever and CXR findings of pneumonia. - possibly from health care associated pneumonia.  - normal lactic acid after fluid boluses.  - Urine Strep antigen is negative, legionella antigen is negative.  - blood cultures are ordered and pending.  - on 3.5 [place] oxygen at baseline and currently requiring 4 lit of [place] oxygen.  - keep sats greater than 90%.  - CXR shows no evidence of pneumonia or almost resolved.     2. Chronic indwelling supra pubic catheter: Recently completed treatment for pseudomonas UTI.  Recently changed supra pubic catheter this month.  Pt reported some supra pubic pain and spasms and repeat UA was negative for infection.    3. Diabetes Mellitus: CBG (last 3)   Recent Labs (last 2 labs)       Recent Labs  11/10/15 0727 11/10/15 1141 11/10/15 1708  GLUCAP 128* 227* 124*      hgba1c is 6.6  Resume home meds.   4. Anemia: Normocytic.    5. Hypertension: Controlled.    6. Acute encephalopathy: Probably from high fever. She became oriented ,when her fever resolved.  She is alert and oriented.   7. OSA: - on CPAP at night.   8. Constipation: Stool softeners and miralax twice a day.  Dulcolax suppository.        Procedures:   none  Consultations:  none  Discharge Exam: Filed Vitals:   11/11/15 1442  BP: 155/107  Pulse: 66  Temp: 99.3 F (37.4 C)  Resp: 20    General: alert comfortable Cardiovascular: s1s2 Respiratory: ctab  Discharge Instructions   Discharge Instructions    Diet - low sodium heart healthy    Complete by:  As directed           Current Discharge Medication List    START taking these medications   Details  cefpodoxime (VANTIN) 200 MG tablet Take 1 tablet (200 mg total) by mouth every 12 (twelve) hours. Qty: 10 tablet, Refills: 0      CONTINUE these medications which have CHANGED   Details  diazepam (VALIUM) 5 MG tablet Take 1-2 tablets (5-10 mg total) by mouth every 12 (twelve) hours as needed for anxiety (TAKES 5MG  IN AM AND 10MG  IN PM, ONLY AS NEEDED FOR ANXIETY). Qty: 10 tablet, Refills: 0    Oxycodone HCl 10 MG TABS Take one tablet by mouth every 4 hours as  needed for pain Qty: 10 each, Refills: 0      CONTINUE these medications which have NOT CHANGED   Details  buPROPion (WELLBUTRIN XL) 300 MG 24 hr tablet Take 300 mg by mouth daily.    docusate sodium 100 MG CAPS Take 200 mg by mouth 2 (two) times daily. Qty: 10 capsule, Refills: 0    gabapentin (NEURONTIN) 300 MG capsule Take 300 mg by mouth 2 (two) times daily.    insulin lispro (HUMALOG) 100 UNIT/ML injection Inject 0-8 Units into the skin See admin instructions. Sliding Scale - Inject before each meal and at bed time    insulin  NPH-regular Human (NOVOLIN 70/30) (70-30) 100 UNIT/ML injection Inject 25 Units into the skin 2 (two) times daily with a meal. Qty: 10 mL, Refills: 11    levothyroxine (SYNTHROID, LEVOTHROID) 125 MCG tablet Take 250 mcg by mouth daily before breakfast.    lubiprostone (AMITIZA) 24 MCG capsule Take 24 mcg by mouth 2 (two) times daily with a meal.    methadone (DOLOPHINE) 10 MG tablet Take three tablets by mouth three times daily for pain Qty: 180 tablet, Refills: 0    omeprazole (PRILOSEC) 20 MG capsule Take 20 mg by mouth daily.    oxybutynin (DITROPAN) 5 MG tablet Take 5 mg by mouth 2 (two) times daily.     PARoxetine (PAXIL-CR) 25 MG 24 hr tablet Take 1 tablet (25 mg total) by mouth daily.    polyethylene glycol (MIRALAX / GLYCOLAX) packet Take 17 g by mouth 2 (two) times daily. Qty: 14 each, Refills: 0    Polyvinyl Alcohol-Povidone (FRESHKOTE) 2.7-2 % SOLN Place 1 drop into both eyes 2 (two) times daily.    potassium chloride (MICRO-K) 10 MEQ CR capsule Take 10 mEq by mouth daily.    sucralfate (CARAFATE) 1 G tablet Take 1 g by mouth 4 (four) times daily -  with meals and at bedtime.    traZODone (DESYREL) 100 MG tablet Take 100 mg by mouth at bedtime.    acetaminophen (TYLENOL) 325 MG tablet Take 650 mg by mouth every 4 (four) hours as needed for mild pain.    benzocaine (ORAJEL) 10 % mucosal gel Use as directed 1 application in the mouth or throat 3 (three) times daily as needed for mouth pain.    bisacodyl (DULCOLAX) 5 MG EC tablet Take 2 tablets (10 mg total) by mouth daily. Qty: 30 tablet, Refills: 0    cloNIDine (CATAPRES) 0.1 MG tablet Take 0.1 mg by mouth as needed.    guaifenesin (ROBITUSSIN) 100 MG/5ML syrup Take 200 mg by mouth every 4 (four) hours as needed for cough.    hydrochlorothiazide (HYDRODIURIL) 25 MG tablet Take 25 mg by mouth daily.    nitroGLYCERIN (NITROSTAT) 0.4 MG SL tablet Place 0.4 mg under the tongue every 5 (five) minutes as needed for chest  pain.    phenazopyridine (PYRIDIUM) 200 MG tablet Take 200 mg by mouth 3 (three) times daily as needed for pain.    Vitamin D, Ergocalciferol, (DRISDOL) 50000 UNITS CAPS capsule Take 50,000 Units by mouth every 14 (fourteen) days.       Allergies  Allergen Reactions  . Ibuprofen Other (See Comments) and Shortness Of Breath  . Sulfa Antibiotics Other (See Comments), Itching and Shortness Of Breath  . Levofloxacin Nausea Only    Other fluoroquinolones OK.  . Lisinopril Other (See Comments)  . Penicillins Other (See Comments) and Hives   Follow-up Information    Follow up  with [facility] .   Specialty:  Skilled Nursing Facility   Contact information:   [address] [place] [address] [phone]       The results of significant diagnostics from this hospitalization (including imaging, microbiology, ancillary and laboratory) are listed below for reference.    Significant Diagnostic Studies: Dg Chest 2 View  11/10/2015  CLINICAL DATA:  Short of breath.  Chest tightness.  History of COPD. EXAM: CHEST  2 VIEW COMPARISON:  11/07/2015 FINDINGS: Frontal view is limited by decreased penetration. On for this, cardiac silhouette is mildly enlarged. There is no convincing mediastinal or hilar mass. There is vascular congestion and mild interstitial thickening, best seen on the lateral view, more on the left than the right. No definite pleural effusion. No pneumothorax. Bony thorax is grossly intact. IMPRESSION: Limited exam. There may be a mild degree of congestive heart failure. No convincing pneumonia. Electronically Signed   By: Amie Portland M.D.   On: 11/10/2015 12:57   Dg Chest 2 View  10/27/2015  CLINICAL DATA:  Chest pain. EXAM: CHEST  2 VIEW COMPARISON:  02/22/2014. FINDINGS: Stable mildly enlarged cardiac silhouette. Interval linear density in the right lower lung zone. Otherwise, clear lungs. The interstitial markings are mildly prominent. No pleural  fluid. Unremarkable bones. IMPRESSION: 1. Interval small amount of linear atelectasis or scarring in the right lower lung zone. 2. Stable cardiomegaly. 3. Mild chronic interstitial lung disease. Electronically Signed   By: Beckie Salts M.D.   On: 10/27/2015 18:06   Ct Head Wo Contrast  11/07/2015  CLINICAL DATA:  Fever and acute mental status changes. EXAM: CT HEAD WITHOUT CONTRAST TECHNIQUE: Contiguous axial images were obtained from the base of the skull through the vertex without intravenous contrast. COMPARISON:  None. FINDINGS: Mild age advanced cerebral atrophy and ventriculomegaly. No extra-axial fluid collections. No CT findings for acute hemispheric infarction or intracranial hemorrhage. No mass lesions. The brainstem and cerebellum are grossly normal. No acute bony findings. No worrisome bone lesions. The paranasal sinuses and mastoid air cells are clear. Minimal scattered ethmoid disease. The globes are intact. IMPRESSION: No acute intracranial findings Electronically Signed   By: Rudie Meyer M.D.   On: 11/07/2015 17:53   Ct Abdomen Pelvis W Contrast  10/28/2015  CLINICAL DATA:  Abdominal pain and distension EXAM: CT ABDOMEN AND PELVIS WITH CONTRAST TECHNIQUE: Multidetector CT imaging of the abdomen and pelvis was performed using the standard protocol following bolus administration of intravenous contrast. CONTRAST:  OMNIPAQUE IOHEXOL 300 MG/ML  SOLN COMPARISON:  02/22/2014 FINDINGS: Lung bases are well aerated with the exception of minimal right basilar infiltrate. No sizable effusion is noted. The liver, gallbladder, spleen, adrenal glands pancreas are within normal limits. Kidneys are well visualized bilaterally within normal enhancement pattern. No renal calculi or urinary tract obstructive changes are noted. The ureters are within normal limits bilaterally. A suprapubic catheter decompresses the bladder. Significant laxity of the anterior abdominal wall is noted but stable from the  prior exam. Aortoiliac calcifications are noted without aneurysmal dilatation. Mild inflammatory changes are noted within the colonic wall in the sigmoid colon. No significant diverticulitis is seen no perforation is noted. No pelvic mass lesion or lymphadenopathy is noted. IMPRESSION: New right basilar infiltrate. Mild thickening of the colonic wall within the sigmoid consistent with a degree of colitis. No perforation is seen. Chronic changes within the abdominal cavity without acute abnormality. Electronically Signed   By: Alcide Clever M.D.   On: 10/28/2015 02:06  Dg Chest Port 1 View  11/11/2015  CLINICAL DATA:  COPD, shortness of breath, morbid obesity, pneumonia. EXAM: PORTABLE CHEST 1 VIEW COMPARISON:  PA and lateral chest x-ray of November 10, 2015 FINDINGS: The lungs are well-expanded. There is persistent infiltrate or atelectasis at the left lung base. There is a small amount of pleural fluid on the right. The cardiac silhouette remains enlarged. The pulmonary vascularity is not engorged. The mediastinum is normal in width. The bony thorax is unremarkable. IMPRESSION: Persistent left basilar atelectasis or pneumonia. Small right pleural effusion. Stable cardiomegaly without significant pulmonary vascular congestion. Electronically Signed   By: David  [place] M.D.   On: 11/11/2015 07:42   Dg Abd Acute W/chest  11/07/2015  CLINICAL DATA:  54 year old nursing home patient with acute fever, mental status changes, generalized abdominal pain and vomiting. Recently treated for urinary tract infection. Indwelling suprapubic catheter. EXAM: DG ABDOMEN ACUTE W/ 1V CHEST COMPARISON:  CT abdomen and pelvis 10/28/2015. Two-view chest x-ray 10/27/2015, 02/22/2014. FINDINGS: Moderate gaseous distension of the tortuous and redundant sigmoid colon, similar to the recent CT. Very large stool burden throughout the colon with the exception of the gas distended sigmoid, again similar to the recent CT. No evidence of  free intraperitoneal air on the lateral decubitus image. No visible opaque urinary tract calculi. Phleboliths in the pelvis. Cardiac silhouette enlarged even allowing for the AP supine technique. Pulmonary vascularity normal. Airspace consolidation in the medial right lung base. Lungs otherwise clear. No pleural effusions. IMPRESSION: 1. No acute abdominal abnormality.  Very large colonic stool burden. 2. Chronic gaseous distension of the tortuous and redundant sigmoid colon which extends up into the right upper quadrant of the abdomen as noted on prior CT. 3. Acute pneumonia involving the right lung base. 4. Stable cardiomegaly without pulmonary edema. Electronically Signed   By: Hulan Saas M.D.   On: 11/07/2015 17:34   Dg Abd Portable 1v  11/11/2015  CLINICAL DATA:  Abdominal pain EXAM: PORTABLE ABDOMEN - 1 VIEW COMPARISON:  11/07/2015 FINDINGS: Scattered large and small bowel gas is noted. Fecal material is noted throughout the colon. No obstructive changes are seen. No acute bony abnormality is noted. No free air is noted. IMPRESSION: Mild constipation without obstructive change. Electronically Signed   By: Alcide Clever M.D.   On: 11/11/2015 14:29   Ct Renal Stone Study  11/07/2015  CLINICAL DATA:  Fever and acute mental status changes. Patient has been moaning and vomiting. EXAM: CT ABDOMEN AND PELVIS WITHOUT CONTRAST TECHNIQUE: Multidetector CT imaging of the abdomen and pelvis was performed following the standard protocol without IV contrast. COMPARISON:  CT scan 10/28/2015 FINDINGS: Lower chest: Minimal streaky bibasilar atelectasis. No pleural effusion. The heart is borderline enlarged. No pericardial effusion. The distal esophagus is grossly normal. Hepatobiliary: No focal hepatic lesions or intrahepatic biliary dilatation. The gallbladder demonstrate suspected layering small gallstones. No findings for acute cholecystitis. No common bile duct dilatation. Pancreas: Moderate fatty atrophy of  the pancreas but no mass or inflammation. Spleen: Stable mild splenomegaly.  No focal lesions. Adrenals/Urinary Tract: The adrenal glands and kidneys are unremarkable. No renal or obstructing ureteral calculi or bladder calculi. The bladder is decompressed by a suprapubic catheter. Mild diffuse wall thickening. Stomach/Bowel: The stomach, duodenum, small bowel and colon are grossly normal without oral contrast. There is a moderate to large amount of stool throughout the colon which may suggest constipation. Stable mild muscular wall thickening of the sigmoid colon. No findings for acute diverticulitis. Vascular/Lymphatic: No mesenteric  or retroperitoneal mass or adenopathy. Small scattered lymph nodes are stable. The aorta is normal in caliber. Scattered atherosclerotic calcifications. Other: Stable uterine fibroids. The ovaries are normal. Stable small scattered pelvic and inguinal lymph nodes. No mass or free fluid in the pelvis. Musculoskeletal: No significant bony findings. IMPRESSION: 1. Streaky bibasilar atelectasis but no definite infiltrates. 2. Suspect layering small gallstones in the gallbladder but no findings for acute cholecystitis. 3. Stable splenomegaly. 4. Muscular wall thickening of the sigmoid colon but no inflammatory changes. 5. Large amount of stool throughout the colon suggesting constipation. 6. Stable uterine fibroids. Electronically Signed   By: Rudie Meyer M.D.   On: 11/07/2015 18:17    Microbiology: Recent Results (from the past 240 hour(s))  Urine culture     Status: None   Collection Time: 11/07/15  4:11 PM  Result Value Ref Range Status   Specimen Description URINE, SUPRAPUBIC  Final   Special Requests NONE  Final   Culture   Final    NO GROWTH 1 DAY Performed at [facility]    Report Status 11/08/2015 FINAL  Final  Culture, blood (routine x 2)     Status: None (Preliminary result)   Collection Time: 11/07/15  4:30 PM  Result Value Ref Range Status   Specimen  Description BLOOD LEFT ANTECUBITAL  Final   Special Requests BOTTLES DRAWN AEROBIC AND ANAEROBIC  Final   Culture   Final    NO GROWTH 4 DAYS Performed at [facility]    Report Status PENDING  Incomplete  Culture, blood (routine x 2)     Status: None (Preliminary result)   Collection Time: 11/07/15  4:37 PM  Result Value Ref Range Status   Specimen Description BLOOD RIGHT ANTECUBITAL  Final   Special Requests BOTTLES DRAWN AEROBIC AND ANAEROBIC  Final   Culture   Final    NO GROWTH 4 DAYS Performed at [facility]    Report Status PENDING  Incomplete  MRSA PCR Screening     Status: None   Collection Time: 11/08/15  1:35 AM  Result Value Ref Range Status   MRSA by PCR NEGATIVE NEGATIVE Final    Comment:        The GeneXpert MRSA Assay (FDA approved for NASAL specimens only), is one component of a comprehensive MRSA colonization surveillance program. It is not intended to diagnose MRSA infection nor to guide or monitor treatment for MRSA infections.      Labs: Basic Metabolic Panel:  Recent Labs Lab 11/07/15 1639 11/07/15 1653 11/08/15 0142 11/09/15 0525  NA 141 135 142 142  K 2.4* 3.8 3.5 4.1  CL 109 91* 100* 101  CO2 27  --  38* 38*  GLUCOSE 96 129* 97 177*  BUN CREATININE 0.42* 0.90 0.77 0.78  CALCIUM 6.2*  --  7.8* 8.3*  MG  --   --  1.9  --    Liver Function Tests:  Recent Labs Lab 11/07/15 1639 11/08/15 0142  AST 13* 15  ALT 11* 14  ALKPHOS 44 60  BILITOT 0.4 0.5  PROT 5.1* 6.6  ALBUMIN 2.4* 3.2*   No results for input(s): LIPASE, AMYLASE in the last 168 hours. No results for input(s): AMMONIA in the last 168 hours. CBC:  Recent Labs Lab 11/07/15 1639 11/07/15 1653 11/08/15 0142 11/09/15 0525  WBC 5.4  --  6.2 4.7  NEUTROABS 4.4  --   --   --  HGB 7.0* 11.9* 8.5* 8.5*  HCT 24.2* 35.0* 29.6* 29.8*  MCV 89.0  --  89.4 91.7  PLT 141*  --  156 130*   Cardiac Enzymes:  Recent Labs Lab  11/07/15 1640  TROPONINI <0.03   BNP: BNP (last 3 results) No results for input(s): BNP in the last 8760 hours.  ProBNP (last 3 results) No results for input(s): PROBNP in the last 8760 hours.  CBG:  Recent Labs Lab 11/10/15 1141 11/10/15 1708 11/10/15 2136 11/11/15 0738 11/11/15 1148  GLUCAP 227* 124* 164* 183* 187*       Signed:  Gardy Montanari  [facility] 11/11/2015, 4:16 PM

## 2015-11-11 NOTE — Progress Notes (Signed)
PT Cancellation Note  Patient Details Name: Theotis BarrioChristy Zuk MRN: [id number] DOB: 05/14/1961   Cancelled Treatment:    Reason Eval/Treat Not Completed: Pain limiting ability to participate. Attempted PT eval-pt declined to participate at this time. Requested PT check back later today.    Rebeca AlertJannie Charnita Trudel, MPT Pager: [phone]

## 2015-11-11 NOTE — Evaluation (Signed)
Physical Therapy Evaluation Patient Details Name: Karen BarrioChristy Walls MRN: [id number] DOB: 10/24/1961 Today's Date: 11/11/2015   History of Present Illness  54 yo female admitted with sepsis. Hx of neuropathy, Lyme disease, morbid obesity, chronic pain, generalized weakness, fibromyalgia, anemai, COPD  Clinical Impression  On eval, pt required Mod-Max assist +2 for mobility-stood at EOB ~10 seconds with bari RW. Pt unable to achieve full standing posture. Sat EOB but had to immediately assist pt back to supine due to pt close to EOB and unable to scoot back safely. Pt is very talkative and somewhat anxious about d/c plan. Encouraged pt to continue to talk with CM/SW. Pt is generally deconditioned. Recommend SNF.     Follow Up Recommendations SNF    Equipment Recommendations   (bariatric walker, trapzeze for bed. Also pt would like to work with PT on using sliding board)    Recommendations for Other Services       Precautions / Restrictions Precautions Precautions: Fall Restrictions Weight Bearing Restrictions: No      Mobility  Bed Mobility Overal bed mobility: Needs Assistance Bed Mobility: Supine to Sit;Sit to Supine     Supine to sit: Mod assist;+2 for physical assistance;+2 for safety/equipment;HOB elevated Sit to supine: +2 for physical assistance;+2 for safety/equipment;Max assist   General bed mobility comments: increased time. Assist for trunk and bil LEs. Utilized bedpad for scooting, positioning. Increased assist for sit to supine due to pt being close to EOB and unable to scoot back. Trendelenberg in order [facility]  Transfers Overall transfer level: Needs assistance Equipment used: Rolling walker (2 wheeled) Transfers: Sit to/from Stand Sit to Stand: Max assist;+2 physical assistance;+2 safety/equipment;From elevated surface         General transfer comment: Assist to rise, stabilize, control descent. Multimodal cues for safety. Stood ~10 seconds -pt did  not reach full standing posture.   Ambulation/Gait                Stairs            Wheelchair Mobility    Modified Rankin (Stroke Patients Only)       Balance           Standing balance support: Bilateral upper extremity supported;During functional activity Standing balance-Leahy Scale: Zero                               Pertinent Vitals/Pain Pain Assessment: Faces Faces Pain Scale: Hurts even more Pain Location: generalized pain with activityt Pain Descriptors / Indicators: Sore Pain Intervention(s): Limited activity within patient's tolerance;Repositioned    Home Living Family/patient expects to be discharged to:: Skilled nursing facility                      Prior Function Level of [place]: Needs assistance   Gait / Transfers Assistance Needed: Pt reports assist with stand pivot [facility] or lift chair/power chair  using RW for support  ADL's / Homemaking Assistance Needed: PTA pt reports she received assist for bathing, cleaning        Hand Dominance        Extremity/Trunk Assessment   Upper Extremity Assessment: Generalized weakness           Lower Extremity Assessment: Generalized weakness RLE Deficits / Details: grossly 3/5 strength LLE Deficits / Details: grossly 3/5 strength  Cervical / Trunk Assessment: Normal  Communication   Communication: No difficulties  Cognition Arousal/Alertness: Awake/alert Behavior During Therapy: Anxious Overall Cognitive Status: Within Functional Limits for tasks assessed                      General Comments      Exercises        Assessment/Plan    PT Assessment Patient needs continued PT services  PT Diagnosis Difficulty walking;Generalized weakness   PT Problem List Decreased strength;Decreased range of motion;Decreased activity tolerance;Decreased balance;Decreased mobility;Obesity;Decreased knowledge of use of DME;Pain  PT Treatment  Interventions Functional mobility training;Therapeutic activities;Patient/family education;Therapeutic exercise;Balance training   PT Goals (Current goals can be found in the Care Plan section) Acute Rehab PT Goals Patient Stated Goal: to learn how to functionl at mod ind level PT Goal Formulation: With patient Time For Goal Achievement: 11/25/15 Potential to Achieve Goals: Fair    Frequency Min 2X/week   Barriers to discharge        Co-evaluation               End of Session Equipment Utilized During Treatment: Oxygen Activity Tolerance: Patient limited by fatigue Patient left: in bed;with call bell/phone within reach           Time: 1430-1510 PT Time Calculation (min) (ACUTE ONLY): 40 min   Charges:   PT Evaluation $Initial PT Evaluation Tier I: 1 Procedure PT Treatments $Therapeutic Activity: 8-22 mins   PT G Codes:        Rebeca Alert, MPT Pager: [phone]

## 2015-11-11 NOTE — Progress Notes (Signed)
Pt still complains of bladder spasms. Urine draining adequately. Clear, no sediments noted. Pt complains of feeling hot. Repeated temp shows 100.8 oral. Pt given valium to help with anxiety and her spasms. Will be given tylenol to help with low grade fever. Pt for discharge tomorrow.

## 2015-11-11 NOTE — Progress Notes (Signed)
[facility] is  [facility]. SNF has been contacted and clinicals sent for review. [facility] for [facility]. CSW will continue to follow to assist with d/c planning to SNF.  Cori RazorJamie Harmonee Tozer LCSW [phone]

## 2015-11-11 NOTE — [place] FL2 (Signed)
[place] MEDICAID [id number] LEVEL OF CARE SCREENING TOOL     IDENTIFICATION  Patient Name: [name] Birthdate: 06/20/1961 Sex: female Admission Date (Current Location): 11/07/2015  [facility] and [place] Number:     Facility and Address:  [facility],  [address] [place] [address]      Provider Number: [id number]  Attending Physician Name and Address:  Kathlen Mody, MD  Relative Name and Phone Number:       Current Level of Care: Hospital Recommended Level of Care: Skilled Nursing Facility Prior Approval Number:    Date Approved/Denied:   PASRR Number:    Discharge Plan: SNF    Current Diagnoses: Patient Active Problem List   Diagnosis Date Noted  . HCAP (healthcare-associated pneumonia) 11/07/2015  . Sepsis (HCC) 11/07/2015  . Abdominal pain 10/28/2015  . Diabetes mellitus type 2, controlled (HCC) 10/28/2015  . MDD (major depressive disorder), recurrent episode, severe (HCC) 10/28/2015  . UTI (lower urinary tract infection) 10/27/2015  . Essential hypertension, benign 09/01/2014  . Scalp lesion 02/23/2014  . 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 Inadequate material resources 02/23/2014  . DNR (do not resuscitate) 02/23/2014  . Urinary tract infection 02/22/2014  . COPD (chronic obstructive pulmonary disease) (HCC) 02/18/2014  . Anemia, iron deficiency 02/18/2014  . Hereditary and idiopathic peripheral neuropathy 10/19/2013  . Chronic pain syndrome 10/19/2013  . Morbid obesity (HCC) 10/19/2013  . Type II or unspecified type diabetes mellitus with peripheral circulatory disorders, uncontrolled(250.72) 10/19/2013  . Depression 10/19/2013  . Hypothyroidism 10/19/2013  . Unspecified constipation 10/19/2013    Orientation ACTIVITIES/SOCIAL BLADDER RESPIRATION    Self, Time, Situation, Place  Active Continent O2 (As needed)  BEHAVIORAL SYMPTOMS/MOOD NEUROLOGICAL BOWEL NUTRITION STATUS   (no behaviors)   Continent Diet  PHYSICIAN VISITS  COMMUNICATION OF NEEDS Height & Weight Skin    Verbally  (172.7 cm) 303 lbs. Normal          AMBULATORY STATUS RESPIRATION      O2 (As needed)      Personal Care Assistance Level of Assistance  Bathing, Dressing Bathing Assistance: Limited assistance Feeding assistance: Independent Dressing Assistance: Limited assistance      Functional Limitations Info  Sight, Hearing, Speech             SPECIAL CARE FACTORS FREQUENCY  PT (By licensed PT)                   Additional Factors Info  Code Status, Insulin Sliding Scale Code Status Info: Full Code             Current Medications (11/11/2015): Current Facility-Administered Medications  Medication Dose Route Frequency Provider Last Rate Last Dose  . acetaminophen (TYLENOL) tablet 650 mg  650 mg Oral Q4H PRN Alberteen Sam, MD      . alum & mag hydroxide-simeth (MAALOX/MYLANTA) 200-200-20 MG/5ML suspension 30 mL  30 mL Oral Q6H PRN Kathlen Mody, MD   30 mL at 11/09/15 2128  . antiseptic oral rinse (CPC / CETYLPYRIDINIUM CHLORIDE 0.05%) solution 7 mL  7 mL Mouth Rinse BID Alberteen Sam, MD   7 mL at 11/10/15 2143  . bisacodyl (DULCOLAX) suppository 10 mg  10 mg Rectal Daily PRN Kathlen Mody, MD   10 mg at 11/09/15 1259  . buPROPion (WELLBUTRIN XL) 24 hr tablet 300 mg  300 mg Oral Daily Alberteen Sam, MD   300 mg at 11/10/15 1004  . cefTAZidime (FORTAZ) 2 g in dextrose 5 %  50 mL IVPB  2 g Intravenous 3 times per day Derwood Kaplan, MD   2 g at 11/11/15 0448  . diazepam (VALIUM) tablet 5-10 mg  5-10 mg Oral Q12H PRN Alberteen Sam, MD      . docusate sodium (COLACE) capsule 200 mg  200 mg Oral BID Alberteen Sam, MD   200 mg at 11/10/15 2141  . enoxaparin (LOVENOX) injection 60 mg  60 mg Subcutaneous Q24H Alberteen Sam, MD   60 mg at 11/10/15 1008  . gabapentin (NEURONTIN) capsule 300 mg  300 mg Oral BID Alberteen Sam, MD   300 mg at 11/10/15 2141  . insulin  aspart (novoLOG) injection 0-15 Units  0-15 Units Subcutaneous TID WC Alberteen Sam, MD   3 Units at 11/11/15 0808  . insulin aspart (novoLOG) injection 0-5 Units  0-5 Units Subcutaneous QHS Alberteen Sam, MD   2 Units at 11/09/15 2131  . levothyroxine (SYNTHROID, LEVOTHROID) tablet 250 mcg  250 mcg Oral QAC breakfast Alberteen Sam, MD   250 mcg at 11/11/15 0804  . lubiprostone (AMITIZA) capsule 24 mcg  24 mcg Oral BID WC Alberteen Sam, MD   24 mcg at 11/11/15 0807  . methadone (DOLOPHINE) tablet 30 mg  30 mg Oral 3 times per day Alberteen Sam, MD   30 mg at 11/11/15 0518  . oxybutynin (DITROPAN) tablet 5 mg  5 mg Oral BID Alberteen Sam, MD   5 mg at 11/10/15 2141  . oxyCODONE (Oxy IR/ROXICODONE) immediate release tablet 10 mg  10 mg Oral Q6H PRN Alberteen Sam, MD   10 mg at 11/10/15 2141  . pantoprazole (PROTONIX) EC tablet 40 mg  40 mg Oral Daily Jinger Neighbors, NP   40 mg at 11/10/15 1003  . PARoxetine (PAXIL-CR) 24 hr tablet 25 mg  25 mg Oral Daily Alberteen Sam, MD   25 mg at 11/10/15 1001  . polyethylene glycol (MIRALAX / GLYCOLAX) packet 17 g  17 g Oral BID Alberteen Sam, MD   17 g at 11/10/15 2143  . potassium chloride SA (K-DUR,KLOR-CON) CR tablet 20 mEq  20 mEq Oral BID Alberteen Sam, MD   20 mEq at 11/10/15 2141  . sodium chloride 0.9 % injection 3 mL  3 mL Intravenous Q12H Alberteen Sam, MD   3 mL at 11/10/15 1053  . sucralfate (CARAFATE) tablet 1 g  1 g Oral TID WC & HS Kathlen Mody, MD   1 g at 11/11/15 0808  . traMADol (ULTRAM) tablet 50 mg  50 mg Oral Q6H PRN Kathlen Mody, MD   50 mg at 11/09/15 2134  . traZODone (DESYREL) tablet 100 mg  100 mg Oral QHS Kathlen Mody, MD   100 mg at 11/10/15 2141  . vancomycin (VANCOCIN) 1,250 mg in sodium chloride 0.9 % 250 mL IVPB  1,250 mg Intravenous Q12H Ankit Nanavati, MD   1,250 mg at 11/11/15 0518   Do not use this list as official medication orders. Please  verify with discharge summary.  Discharge Medications:   Medication List    ASK your doctor about these medications        acetaminophen 325 MG tablet  Commonly known as:  TYLENOL  Take 650 mg by mouth every 4 (four) hours as needed for mild pain.     benzocaine 10 % mucosal gel  Commonly known as:  ORAJEL  Use as directed 1 application in the  mouth or throat 3 (three) times daily as needed for mouth pain.     bisacodyl 5 MG EC tablet  Commonly known as:  DULCOLAX  Take 2 tablets (10 mg total) by mouth daily.     buPROPion 300 MG 24 hr tablet  Commonly known as:  WELLBUTRIN XL  Take 300 mg by mouth daily.     cloNIDine 0.1 MG tablet  Commonly known as:  CATAPRES  Take 0.1 mg by mouth as needed.     diazepam 5 MG tablet  Commonly known as:  VALIUM  Take 1-2 tablets (5-10 mg total) by mouth every 12 (twelve) hours as needed for anxiety (TAKES 5MG  IN AM AND 10MG  IN PM, ONLY AS NEEDED FOR ANXIETY).     DSS 100 MG Caps  Take 200 mg by mouth 2 (two) times daily.     FRESHKOTE 2.7-2 % Soln  Generic drug:  Polyvinyl Alcohol-Povidone  Place 1 drop into both eyes 2 (two) times daily.     gabapentin 300 MG capsule  Commonly known as:  NEURONTIN  Take 300 mg by mouth 2 (two) times daily.     guaifenesin 100 MG/5ML syrup  Commonly known as:  ROBITUSSIN  Take 200 mg by mouth every 4 (four) hours as needed for cough.     hydrochlorothiazide 25 MG tablet  Commonly known as:  HYDRODIURIL  Take 25 mg by mouth daily.     insulin lispro 100 UNIT/ML injection  Commonly known as:  HUMALOG  Inject 0-8 Units into the skin See admin instructions. Sliding Scale - Inject before each meal and at bed time     insulin NPH-regular Human (70-30) 100 UNIT/ML injection  Commonly known as:  NOVOLIN 70/30  Inject 25 Units into the skin 2 (two) times daily with a meal.     levothyroxine 125 MCG tablet  Commonly known as:  SYNTHROID, LEVOTHROID  Take 250 mcg by mouth daily before breakfast.      lubiprostone 24 MCG capsule  Commonly known as:  AMITIZA  Take 24 mcg by mouth 2 (two) times daily with a meal.     methadone 10 MG tablet  Commonly known as:  DOLOPHINE  Take three tablets by mouth three times daily for pain     nitroGLYCERIN 0.4 MG SL tablet  Commonly known as:  NITROSTAT  Place 0.4 mg under the tongue every 5 (five) minutes as needed for chest pain.     omeprazole 20 MG capsule  Commonly known as:  PRILOSEC  Take 20 mg by mouth daily.     oxybutynin 5 MG tablet  Commonly known as:  DITROPAN  Take 5 mg by mouth 2 (two) times daily.     Oxycodone HCl 10 MG Tabs  Take one tablet by mouth every 4 hours as needed for pain     PARoxetine 25 MG 24 hr tablet  Commonly known as:  PAXIL-CR  Take 1 tablet (25 mg total) by mouth daily.     phenazopyridine 200 MG tablet  Commonly known as:  PYRIDIUM  Take 200 mg by mouth 3 (three) times daily as needed for pain.     polyethylene glycol packet  Commonly known as:  MIRALAX / GLYCOLAX  Take 17 g by mouth 2 (two) times daily.     potassium chloride 10 MEQ CR capsule  Commonly known as:  MICRO-K  Take 10 mEq by mouth daily.     sucralfate 1 G tablet  Commonly known as:  CARAFATE  Take 1 g by mouth 4 (four) times daily -  with meals and at bedtime.     traZODone 100 MG tablet  Commonly known as:  DESYREL  Take 100 mg by mouth at bedtime.     Vitamin D (Ergocalciferol) 50000 UNITS Caps capsule  Commonly known as:  DRISDOL  Take 50,000 Units by mouth every 14 (fourteen) days.        Relevant Imaging Results:  Relevant Lab Results:  Recent Labs    Additional Information    Mattalyn Anderegg, Dickey GaveJamie Lee, LCSW

## 2015-11-12 DIAGNOSIS — A419 Sepsis, unspecified organism: Principal | ICD-10-CM

## 2015-11-12 LAB — URINE MICROSCOPIC-ADD ON

## 2015-11-12 LAB — GLUCOSE, CAPILLARY
Glucose-Capillary: 200 mg/dL — ABNORMAL HIGH (ref 65–99)
Glucose-Capillary: 144 mg/dL — ABNORMAL HIGH (ref 65–99)

## 2015-11-12 LAB — CULTURE, BLOOD (ROUTINE X 2)
CULTURE: NO GROWTH
Culture: NO GROWTH

## 2015-11-12 LAB — URINALYSIS, ROUTINE W REFLEX MICROSCOPIC
BILIRUBIN URINE: NEGATIVE
Glucose, UA: NEGATIVE mg/dL
Hgb urine dipstick: NEGATIVE
KETONES UR: NEGATIVE mg/dL
Nitrite: NEGATIVE
PROTEIN: NEGATIVE mg/dL
Specific Gravity, Urine: 1.016 (ref 1.005–1.030)
pH: 8 (ref 5.0–8.0)

## 2015-11-12 MED ORDER — BISACODYL 10 MG RE SUPP
10.0000 mg | Freq: Once | RECTAL | Status: AC
  Administered 2015-11-12: 10 mg via RECTAL
  Filled 2015-11-12: qty 1

## 2015-11-12 MED ORDER — CEFPODOXIME PROXETIL 200 MG PO TABS: 200.0000 mg | ORAL_TABLET | Freq: Two times a day (BID) | ORAL | Status: DC

## 2015-11-12 MED ORDER — DIAZEPAM 5 MG PO TABS: 5.0000 mg | ORAL_TABLET | Freq: Two times a day (BID) | ORAL | Status: DC | PRN

## 2015-11-12 NOTE — Discharge Instructions (Signed)

## 2015-11-12 NOTE — Care Management Note (Signed)
Case Management Note  Patient Details  Name: [name] MRN: [id number] Date of Birth: 05/17/1961  Subjective/Objective:  Noted temp overnight.Per MD stable for d/c SNF.                  Action/Plan:d/c SNF.   Expected Discharge Date:                 Expected Discharge Plan:  Skilled Nursing Facility  In-House Referral:  Clinical Social Work  Discharge planning Services  CM Consult  Post Acute Care Choice:    Choice offered to:     DME Arranged:    DME Agency:     HH Arranged:    HH Agency:     Status of Service:  Completed, signed off  Medicare Important Message Given:  Yes Date Medicare IM Given:    Medicare IM give by:    Date Additional Medicare IM Given:    Additional Medicare Important Message give by:     If discussed at Long Length of Stay Meetings, dates discussed:    Additional Comments:  Lanier ClamMahabir, Rayansh Herbst, RN 11/12/2015, 10:23 AM

## 2015-11-12 NOTE — Progress Notes (Signed)
[facility]. Patient agreeable to this plan- plan transfer via EMS. Reece LevyJanet Rhilee Currin, MSW, Theresia MajorsLCSWA [phone]

## 2015-11-12 NOTE — Progress Notes (Signed)
Pt seen and examined at bedside. Had fever overnight, will repeat UA today, she is already on vantin. She is stable for d/c today. Please see Dr. Darci NeedleAkula's d/c summary from 11/11/2015.  Karen PrestoMAGICK-Kae Lauman, MD  [facility] Pager [phone]  If 7PM-7AM, please contact night-coverage [email or web address] Password [email or web address]

## 2015-11-12 NOTE — Progress Notes (Signed)
Adjusted Pt Biipap mask to fix leak. Educated Pt on use of 02 with Bipap at the SNF. Demonstrated use of 02 tubing with Pt teach back and demonstration. Pt expresses understanding of use of 02 with the Bipap. Pt plans to take hospital supplied Bipap mask for use with SNF supplied Bipap machine.

## 2015-11-18 ENCOUNTER — Non-Acute Institutional Stay (SKILLED_NURSING_FACILITY): Payer: Medicare Other | Admitting: Internal Medicine

## 2015-11-18 DIAGNOSIS — J9610 Chronic respiratory failure, unspecified whether with hypoxia or hypercapnia: Secondary | ICD-10-CM | POA: Diagnosis not present

## 2015-11-18 DIAGNOSIS — D509 Iron deficiency anemia, unspecified: Secondary | ICD-10-CM

## 2015-11-18 DIAGNOSIS — J189 Pneumonia, unspecified organism: Secondary | ICD-10-CM

## 2015-11-18 DIAGNOSIS — R300 Dysuria: Secondary | ICD-10-CM

## 2015-11-18 DIAGNOSIS — J181 Lobar pneumonia, unspecified organism: Secondary | ICD-10-CM

## 2015-11-18 NOTE — Progress Notes (Signed)
Patient ID: [name], female   DOB: 1961-11-01, 54 y.o.   MRN: [id number]   Facility facility; [facility] Complaint; readmission to the facility post stay at [facility] 11/11 through 11/15 History; patient recently in hospital with Pseudomonas and Providencia rettgeri UTI. On the day of hospitalization this time she was sent to the ER with a temperature 101 apparently low O2 sats. In the ER temperature was 104 she was felt to have right lower lobe pneumonia and treated with antibiotics. She temporarily required BiPAP. A CT scan of the abdomen showed constipation although I don't see the confirmation of the right lower lobe pneumonia that was reported in the discharge summary. She did have a CT scan of the abdomen in early November that suggested this. In any case an LP was attempted but could not be completed [due to fever and altered LOC]. She had an elevated lactic acid level. I note that she had a low calcium and potassium on admission however I wonder if this was drawn downstream from a lying or from a central line as this seemed to rapidly corrected and wasn't present when she left the hospital recently. Her blood cultures and urine cultures were negative on this hospitalization  The patient states that her breathing is stable however her strength is a lot less than before she went out. She complains of pain in the left side of her neck, pelvic pain.   CBC Latest Ref Rng 11/09/2015 11/08/2015 11/07/2015  WBC 4.0 - 10.5 K/uL 4.7 6.2 -  Hemoglobin 12.0 - 15.0 g/dL 4.0(J) 8.1(X) 11.9(L)  Hematocrit 36.0 - 46.0 % 29.8(L) 29.6(L) 35.0(L)  Platelets 150 - 400 K/uL 130(L) 156 -    BMP Latest Ref Rng 11/09/2015 11/08/2015 11/07/2015  Glucose 65 - 99 mg/dL 914(N) 97 829(F)  BUN 6 - 20 mg/dL Creatinine 0.44 - 1.00 mg/dL 6.21 3.08 6.57  Sodium 135 - 145 mmol/L 142 142 135  Potassium 3.5 - 5.1 mmol/L 4.1 3.5 3.8  Chloride 101 - 111 mmol/L 101 100(L) 91(L)  CO2 22 - 32 mmol/L 38(H)  38(H) -  Calcium 8.9 - 10.3 mg/dL 8.3(L) 7.8(L) -    Past Medical History  Diagnosis Date  . Diabetes mellitus without complication (HCC)   . Chronic pain   . Morbid obesity (HCC)   . OSA (obstructive sleep apnea)   . Generalized weakness   . Fibromyalgia   . Lyme disease   . Anemia   . History of blood transfusion     pt states she has had 5 blood transfusions  . Sleep disorder   . UTI (lower urinary tract infection) 02/22/2014  . Hypertension   . Hypothyroidism   . COPD (chronic obstructive pulmonary disease) (HCC)   . Shortness of breath   . GERD (gastroesophageal reflux disease)    Past Surgical History  Procedure Laterality Date  . Explaratory      on stomach,   . Appendectomy    . Foot surgery      due to stepping on broken glass  . Spinal tap    . Supra pubic catheter    . Esophagogastroduodenoscopy (egd) with propofol N/A 03/05/2014    Procedure: ESOPHAGOGASTRODUODENOSCOPY (EGD) WITH PROPOFOL;  Surgeon: Willis Modena, MD;  Location: [facility];  Service: Endoscopy;  Laterality: N/A;  . Colonoscopy with propofol N/A 03/05/2014    Procedure: COLONOSCOPY WITH PROPOFOL;  Surgeon: Willis Modena, MD;  Location: [facility];  Service: Endoscopy;  Laterality: N/A;  .  Dental restoration/extraction with x-ray    . Colonoscopy with propofol N/A 03/06/2014    Procedure: COLONOSCOPY WITH PROPOFOL;  Surgeon: Willis Modena, MD;  Location: [facility];  Service: Endoscopy;  Laterality: N/A;    Current Outpatient Prescriptions on File Prior to Visit  Medication Sig Dispense Refill  . acetaminophen (TYLENOL) 325 MG tablet Take 650 mg by mouth every 4 (four) hours as needed for mild pain.    . benzocaine (ORAJEL) 10 % mucosal gel Use as directed 1 application in the mouth or throat 3 (three) times daily as needed for mouth pain.    . bisacodyl (DULCOLAX) 5 MG EC tablet Take 2 tablets (10 mg total) by mouth daily. (Patient taking differently: Take 5 mg by mouth every other day. )  30 tablet 0  . buPROPion (WELLBUTRIN XL) 300 MG 24 hr tablet Take 300 mg by mouth daily.    . cefpodoxime (VANTIN) 200 MG tablet Take 1 tablet (200 mg total) by mouth every 12 (twelve) hours. 10 tablet 0  . cloNIDine (CATAPRES) 0.1 MG tablet Take 0.1 mg by mouth as needed.    . diazepam (VALIUM) 5 MG tablet Take 1-2 tablets (5-10 mg total) by mouth every 12 (twelve) hours as needed for anxiety (TAKES 5MG  IN AM AND 10MG  IN PM, ONLY AS NEEDED FOR ANXIETY). 10 tablet 0  . docusate sodium 100 MG CAPS Take 200 mg by mouth 2 (two) times daily. 10 capsule 0  . gabapentin (NEURONTIN) 300 MG capsule Take 300 mg by mouth 2 (two) times daily.    Marland Kitchen guaifenesin (ROBITUSSIN) 100 MG/5ML syrup Take 200 mg by mouth every 4 (four) hours as needed for cough.    . hydrochlorothiazide (HYDRODIURIL) 25 MG tablet Take 25 mg by mouth daily.    . insulin lispro (HUMALOG) 100 UNIT/ML injection Inject 0-8 Units into the skin See admin instructions. Sliding Scale - Inject before each meal and at bed time    . insulin NPH-regular Human (NOVOLIN 70/30) (70-30) 100 UNIT/ML injection Inject 25 Units into the skin 2 (two) times daily with a meal. 10 mL 11  . levothyroxine (SYNTHROID, LEVOTHROID) 125 MCG tablet Take 250 mcg by mouth daily before breakfast.    . lubiprostone (AMITIZA) 24 MCG capsule Take 24 mcg by mouth 2 (two) times daily with a meal.    . methadone (DOLOPHINE) 10 MG tablet Take three tablets by mouth three times daily for pain 180 tablet 0  . nitroGLYCERIN (NITROSTAT) 0.4 MG SL tablet Place 0.4 mg under the tongue every 5 (five) minutes as needed for chest pain.    Marland Kitchen omeprazole (PRILOSEC) 20 MG capsule Take 20 mg by mouth daily.    Marland Kitchen oxybutynin (DITROPAN) 5 MG tablet Take 5 mg by mouth 2 (two) times daily.     . Oxycodone HCl 10 MG TABS Take one tablet by mouth every 4 hours as needed for pain 10 each 0  . PARoxetine (PAXIL-CR) 25 MG 24 hr tablet Take 1 tablet (25 mg total) by mouth daily.    . phenazopyridine  (PYRIDIUM) 200 MG tablet Take 200 mg by mouth 3 (three) times daily as needed for pain.    . polyethylene glycol (MIRALAX / GLYCOLAX) packet Take 17 g by mouth 2 (two) times daily. 14 each 0  . Polyvinyl Alcohol-Povidone (FRESHKOTE) 2.7-2 % SOLN Place 1 drop into both eyes 2 (two) times daily.    . potassium chloride (MICRO-K) 10 MEQ CR capsule Take 10 mEq by mouth daily.    Marland Kitchen  sucralfate (CARAFATE) 1 G tablet Take 1 g by mouth 4 (four) times daily -  with meals and at bedtime.    . traZODone (DESYREL) 100 MG tablet Take 100 mg by mouth at bedtime.    . Vitamin D, Ergocalciferol, (DRISDOL) 50000 UNITS CAPS capsule Take 50,000 Units by mouth every 14 (fourteen) days.       Social; the patient came to us from a facility in [place]. Much of her medical information is from her own account of things I have never been able to get much past medical history on her. Therefore issues such as her chronic immune deficiency, Lyme's disease, chronic interstitial cystitis are somewhat suspect in the reliability. In any case her family was from here but as I understand things both her parents are now in facilities. She apparently overestimated her overall abilities and discussion with the staff from [facility] recently and there has been pressure to find her an assisted living. I do not believe that she would do well in this environment. She needs nursing and frequent physician visits to have any chance its stability. I do not believe that this patient is capable of living independently even with assistance in her home.  family history includes Diabetes Mellitus II in her mother.  Review of systems Gen. patient states she feels exhausted and weak. HEENT no oral pain Respiratory; states she is more short of breath than usual and would like the "CPAP" that she used in hospital. She tells me she had a BiPAP before in [place] and had 3 previous sleep studies Cardiac no chest pain Abdomen states her constipation is  better with the Dulcolax suppositories I gave her GU chronic suprapubic/pelvic pain. She has a chronic suprapubic catheter which she says was put in for interstitial cystitis. Extremities; states her legs are much weaker Neurologic complains of generalized pain left side of her face neck left leg  Physical examination Gen. the patient doesn't look more fatigued than I'm used to seeing although she talks coherently HEENT oral exam is normal Neck no thyroid is palpable Lymph none palpable in the cervical clavicular or axillary areas Respiratory; few crackles in the right lower lobe no wheezing. She has digital clubbing bilaterally Cardiac heart sounds are normal no murmurs JVP is not elevated she appears to be euvolemic Abdomen; distended bowel sounds are sparse but present. There is no no liver no spleen. Ventral hernia GU catheter appears normal suprapubic tenderness no guarding no CVA tenderness Extremities; no evidence of a DVT  Impression/plan #1 high fever hypoxemia felt to be secondary to right lower lobe pneumonia although the radiologic evidence for this doesn't seem to be overwhelming. She appears better. She has chronic hypoxemia and digital clubbing cause of both of these have never really been clear. I wanted to do a CT scan of the chest with contrast at some point although trying to get these patient to attend appointments that we arrange has been challenging in the past. #2 patient states that she has obstructive sleep apnea and has had 3 previous sleep studies. I've never had access to these. She has not agreed in the past to attend another sleep study but I will try to arrange one in follow-up. #3 chronic constipation/Ileus her abdomen is not nearly as distended as I've seen it previously. 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 She constantly complains  of suprapubic pain #8 chronic iron deficiency anemia the source of which is not completely clear. She did have an attempt at an endoscopy and colonoscopy in 2015 didn't did not show any lesions. She refuses iron in any form  The patient has not been an easy patient to arrange outpatient appointments or consultations. I am going to try to arrange an outpatient sleep study.    Another possibly related issue is the issue of discharging her from this building to an assisted living. I do not agreed to this however I don't feel like I have the last word on it either. Although this patient can remain independent in the small area of her room she certainly could not be independent in her own home. I am also believe that discharging her to an assisted living will only result in frequent hospitalizations and readmission to skilled facility. She will find a way not to work with therapy.   changes.   EXAM: CT HEAD WITHOUT CONTRAST   TECHNIQUE: Contiguous axial images were obtained from the base of the skull through the vertex without intravenous contrast.   COMPARISON:  None.   FINDINGS: Mild age advanced cerebral atrophy and ventriculomegaly. No extra-axial fluid collections. No CT findings for acute hemispheric infarction or intracranial hemorrhage. No mass lesions. The brainstem and cerebellum are grossly normal.   No acute bony findings. No worrisome bone lesions. The paranasal sinuses and mastoid air cells are clear. Minimal scattered ethmoid disease. The globes are intact.   IMPRESSION: No acute intracranial findings     Electronically Signed   By: Rudie Meyer M.D.   On: 11/07/2015 17:53       CLINICAL DATA:  Fever and acute mental status changes. Patient has been moaning and vomiting.   EXAM: CT ABDOMEN AND PELVIS WITHOUT CONTRAST   TECHNIQUE: Multidetector CT imaging of the abdomen and pelvis was performed following the  standard protocol without IV contrast.   COMPARISON:  CT scan 10/28/2015   FINDINGS: Lower chest: Minimal streaky bibasilar atelectasis. No pleural effusion. The heart is borderline enlarged. No pericardial effusion. The distal esophagus is grossly normal.   Hepatobiliary: No focal hepatic lesions or intrahepatic biliary dilatation. The gallbladder demonstrate suspected layering small gallstones. No findings for acute cholecystitis. No common bile duct dilatation.   Pancreas: Moderate fatty atrophy of the pancreas but no mass or inflammation.   Spleen: Stable mild splenomegaly.  No focal lesions.   Adrenals/Urinary Tract: The adrenal glands and kidneys are unremarkable. No renal or obstructing ureteral calculi or bladder calculi. The bladder is decompressed by a suprapubic catheter. Mild diffuse wall thickening.   Stomach/Bowel: The stomach, duodenum, small bowel and colon are grossly normal without oral contrast. There is a moderate to large amount of stool throughout the colon which may suggest constipation. Stable mild muscular wall thickening of the sigmoid colon. No findings for acute diverticulitis.   Vascular/Lymphatic: No mesenteric or retroperitoneal mass or adenopathy. Small scattered lymph nodes are stable. The aorta is normal in caliber. Scattered atherosclerotic calcifications.   Other: Stable uterine fibroids. The ovaries are normal. Stable small scattered pelvic and inguinal lymph nodes. No mass or free fluid in the pelvis.   Musculoskeletal: No significant bony findings.   IMPRESSION: 1. Streaky bibasilar atelectasis but no definite infiltrates. 2. Suspect layering small gallstones in the gallbladder but no findings for acute cholecystitis. 3. Stable splenomegaly. 4. Muscular wall thickening of the sigmoid colon but no inflammatory changes. 5. Large amount of stool throughout the  colon suggesting constipation. 6. Stable uterine fibroids.       Electronically Signed   By: P.  GallerRudie Meyer On: 11/07/2015 18:17       Study Result       CLINICAL DATA:  Abdominal pain and distension   EXAM: CT ABDOMEN AND PELVIS WITH CONTRAST   TECHNIQUE: Multidetector CT imaging of the abdomen and pelvis was performed using the standard protocol following bolus administration of intravenous contrast.   CONTRAST:  OMNIPAQUE IOHEXOL 300 MG/ML  SOLN   COMPARISON:  02/22/2014   FINDINGS: Lung bases are well aerated with the exception of minimal right basilar infiltrate. No sizable effusion is noted.   The liver, gallbladder, spleen, adrenal glands pancreas are within normal limits. Kidneys are well visualized bilaterally within normal enhancement pattern. No renal calculi or urinary tract obstructive changes are noted. The ureters are within normal limits bilaterally. A suprapubic catheter decompresses the bladder.   Significant laxity of the anterior abdominal wall is noted but stable from the prior exam.   Aortoiliac calcifications are noted without aneurysmal dilatation. Mild inflammatory changes are noted within the colonic wall in the sigmoid colon. No significant diverticulitis is seen no perforation is noted.   No pelvic mass lesion or lymphadenopathy is noted.   IMPRESSION: New right basilar infiltrate.   Mild thickening of the colonic wall within the sigmoid consistent with a degree of colitis. No perforation is seen.   Chronic changes within the abdominal cavity without acute abnormality.     Electronically Signed   By: Alcide Clever M.D.   On: 10/28/2015 02:06

## 2015-11-19 ENCOUNTER — Other Ambulatory Visit: Payer: Self-pay

## 2015-11-19 MED ORDER — METHADONE HCL 10 MG PO TABS: ORAL_TABLET | ORAL | Status: DC

## 2015-12-02 ENCOUNTER — Non-Acute Institutional Stay (SKILLED_NURSING_FACILITY): Payer: Medicare Other | Admitting: Internal Medicine

## 2015-12-02 DIAGNOSIS — J438 Other emphysema: Secondary | ICD-10-CM

## 2015-12-02 DIAGNOSIS — D500 Iron deficiency anemia secondary to blood loss (chronic): Secondary | ICD-10-CM | POA: Diagnosis not present

## 2015-12-02 DIAGNOSIS — J9610 Chronic respiratory failure, unspecified whether with hypoxia or hypercapnia: Secondary | ICD-10-CM

## 2015-12-07 NOTE — Progress Notes (Signed)
Patient ID: [name], female   DOB: 07/14/1961, 54 y.o.   MRN: [id number]                PROGRESS NOTE  DATE:  12/02/2015       [facility]                    LEVEL OF CARE:   SNF   Acute Visit              CHIEF COMPLAINT:  Review of medical status, post two rapid hospitalizations, firstly from 10/27/2015 through 10/31/2015, then 11/07/2015 through 11/11/2015.      HISTORY OF PRESENT ILLNESS:  [name] is a patient who has been in the building for 2-3 years.  She came to us from a nursing home in [place].  Therefore, the information we have on her is limited.    She has a large number of supported medical problems that we have partially worked through in the years she has been here, and a large number of other medical complaints that she has that I have never really seen the proof for.    In any case, she was first hospitalized earlier last month at which time she was actually being sent to an assisted living in [place] due to the fact that the [facility] people said that her current care needs did not require a nursing home.  I think this largely relates to the fact that the patient told the [facility] people she would probably do better in her own modified apartment with an aide three days a week.  She, therefore, largely does not have a [facility] stating that she needs a skilled facility.    In any case, the patient did not want to leave here and go to the assisted living near [place].  She convinced the drivers to take her to [facility], complaining of abdominal pain.  They did a catheterization on her, chronic Foley catheter, which showed gram-negatives.  They treated her as though she had a UTI.  They had her seen by Psychiatry and medication adjustments were made.    The second hospitalization was more legitimate at which time she was sent to the hospital with a high temperature and altered LOC.  Her blood and urine cultures were negative.  The etiology of the fever was  felt to be right lower lobe pneumonia.      The medical issues that the patient seems to have some support for include the following:    She has been on chronic oxygen with chronic respiratory failure.  States that she has asthma and sleep apnea and has had three previous sleep studies, although I do not have any of these results.  She did have a venous blood gas on 11/07/2015 that showed a pO2 of 53.3, a pCO2 of 29, and a bicarbonate of 25.8.    Iron deficiency anemia.  This was partially worked up in 2015.  No obvious source of bleeding was determined.  She did not have a capsule endoscopy or screening for celiac disease.  Her iron deficiency anemia is complicated by the fact that she cannot take iron in any form.    Type 2 diabetes with unclear complications.    History of fibromyalgia, degenerative joint disease, and chronic pain syndrome, for which she has been on longstanding methadone.    Chronic constipation/ileus.  At some points, this has been quite severe.  However, we have  been able to  get her on a better bowel regimen recently.    Chronic suprapubic catheter secondary, she says, to chronic interstitial cystitis although I have not seen the verification of this and she has not agreed to follow with a urologist in the past.    COPD/asthma.  I had wanted to consider her for PFTs although I am doubtful she will agree to this, either.    Hypothyroidism.  On replacement.    Depression.  I think there is little doubt about this.  The patient was depressed when she came here.  She is chronically depressed.  Her parents are both ill and apparently institutionalized.     Issues that she states she has for which I have little or no evidence include:    Lyme disease.     Chronic immune deficiency which makes it difficult for her to be around "sick people".    PAST MEDICAL HISTORY/PROBLEM LIST:   Reviewed.     SOCIAL HISTORY:          HOUSING:  As mentioned, the patient came from a  nursing home in [place] to be closer to family.  Her parents are both ill and I think institutionalized at this point.   FUNCTIONAL STATUS:  I think this patient is potentially able to stand and transfer to the toilet, but I do not think she is in any way capable of independent living without 24/7 care.  I am doubtful that she will be able to manage any form of light care assisted living.  Some assisted livings can look after disabled people and she might be able to manage there.    CURRENT MEDICATIONS:  Medication list is reviewed.    REVIEW OF SYSTEMS:    GENERAL:  The patient states she feels exhausted, weak, and sick.  The sickness, she states, is mostly centered in her pelvis and is mostly a pain complaint.   HEENT:   She does not have oral pain.   CHEST/RESPIRATORY:  States she is short of breath and would like a CPAP or BiPAP that she used in hospital.      CARDIAC:  No clear chest pain.   GI:  Chronically constipated, with a ventral hernia.   GU:  Chronic suprapubic and pelvic pain.  She has a suprapubic catheter.   Again, this was put in for interstitial cystitis.     NEUROLOGICAL:  Extremities:  States she has lower extremity weakness.    PHYSICAL EXAMINATION:   GENERAL APPEARANCE:   The patient does look more fatigued than I am used to seeing.  When I woke her up, she was certainly less coherent although her speech normalized the longer I was in the room.   HEENT:   MOUTH/THROAT:  Exam is normal.   NECK/THYROID:  No thyroid is palpable.   LYMPHATICS:  None palpable in the cervical, clavicular, or axillary areas.   CHEST/RESPIRATORY:  Her air entry is clear, but shallow.  There is no wheezing, no respiratory distress.  She has digital clubbing bilaterally.        CARDIOVASCULAR:   CARDIAC:  Heart sounds are normal.  JVP is not elevated.  She appears to be euvolemic.     GASTROINTESTINAL:   ABDOMEN:  Once again, very distended.  Bowel sounds are present, but sparse.  There are no  masses.   LIVER/SPLEEN/KIDNEYS:  No liver, no spleen.   HERNIA:  She has a ventral hernia.     GENITOURINARY:   BLADDER:  Her  catheter site looks fine.  She has no pain or guarding.   CIRCULATION:   EDEMA/VARICOSITIES:  Extremities:  No evidence of a DVT.      ASSESSMENT/PLAN:                  Chronic hypoxemic respiratory failure.  Her O2 sat today is 94% on 2 L.  Although it has been listed that she may have COPD, I am less certain about that.    The patient states she has obstructive sleep apnea based on three previous sleep studies, although I do not have any of these results.   I think it would be reasonable to try and get another sleep study on her if she will actually attend this test.    Type 2 diabetes with neuropathy.   On insulin.    Chronic constipation/ileus.  Her abdomen is not as distended as previously.    Chronic depression with anxiety.  This is an issue.    Chronic iron deficiency, the etiology of which is not completely clear.  She did have an endoscopy and colonoscopy in 2015 that did not show any obvious bleeding lesions.  She refuses iron orally or in a parenteral form.    I am going to try to get basic studies done on this patient, including a sleep study.  I would like to do PFTs.  The patient has a history of allowing me to write these orders and then not attending the tests for various reasons.    I have also spoken to the social worker about this patient's need for the supportive environment of a skilled facility.  I simply do not believe this patient could be independent at home without 24-hour care seven days a week.  I am doubtful she will manage in any assisted living unless they have the ability to provide heavy medical and personal care.      CPT CODE: 16109

## 2015-12-08 ENCOUNTER — Other Ambulatory Visit: Payer: Self-pay | Admitting: *Deleted

## 2015-12-08 MED ORDER — METHADONE HCL 10 MG PO TABS: ORAL_TABLET | ORAL | Status: DC

## 2015-12-08 NOTE — Telephone Encounter (Signed)
[facility] 

## 2015-12-09 ENCOUNTER — Non-Acute Institutional Stay (SKILLED_NURSING_FACILITY): Payer: Medicare Other | Admitting: Internal Medicine

## 2015-12-09 DIAGNOSIS — F32A Depression, unspecified: Secondary | ICD-10-CM

## 2015-12-09 DIAGNOSIS — F329 Major depressive disorder, single episode, unspecified: Secondary | ICD-10-CM | POA: Diagnosis not present

## 2015-12-09 DIAGNOSIS — D500 Iron deficiency anemia secondary to blood loss (chronic): Secondary | ICD-10-CM

## 2015-12-09 DIAGNOSIS — J9610 Chronic respiratory failure, unspecified whether with hypoxia or hypercapnia: Secondary | ICD-10-CM | POA: Diagnosis not present

## 2015-12-10 ENCOUNTER — Inpatient Hospital Stay (HOSPITAL_COMMUNITY)
Admission: EM | Admit: 2015-12-10 | Discharge: 2015-12-15 | DRG: 699 | Disposition: A | Discharge status: Skilled Nursing Facility | Payer: Medicare Other | Attending: Internal Medicine | Admitting: Internal Medicine

## 2015-12-10 ENCOUNTER — Emergency Department (HOSPITAL_COMMUNITY): Payer: Medicare Other

## 2015-12-10 ENCOUNTER — Encounter (HOSPITAL_COMMUNITY): Payer: Self-pay | Admitting: Emergency Medicine

## 2015-12-10 DIAGNOSIS — Z880 Allergy status to penicillin: Secondary | ICD-10-CM

## 2015-12-10 DIAGNOSIS — Z881 Allergy status to other antibiotic agents status: Secondary | ICD-10-CM

## 2015-12-10 DIAGNOSIS — G894 Chronic pain syndrome: Secondary | ICD-10-CM

## 2015-12-10 DIAGNOSIS — Z888 Allergy status to other drugs, medicaments and biological substances status: Secondary | ICD-10-CM

## 2015-12-10 DIAGNOSIS — E118 Type 2 diabetes mellitus with unspecified complications: Secondary | ICD-10-CM

## 2015-12-10 DIAGNOSIS — M797 Fibromyalgia: Secondary | ICD-10-CM | POA: Diagnosis present

## 2015-12-10 DIAGNOSIS — T83518A Infection and inflammatory reaction due to other urinary catheter, initial encounter: Secondary | ICD-10-CM | POA: Diagnosis not present

## 2015-12-10 DIAGNOSIS — Z66 Do not resuscitate: Secondary | ICD-10-CM

## 2015-12-10 DIAGNOSIS — Z5987 Material hardship due to limited financial resources, not elsewhere classified: Secondary | ICD-10-CM

## 2015-12-10 DIAGNOSIS — Z79899 Other long term (current) drug therapy: Secondary | ICD-10-CM

## 2015-12-10 DIAGNOSIS — E662 Morbid (severe) obesity with alveolar hypoventilation: Secondary | ICD-10-CM | POA: Diagnosis present

## 2015-12-10 DIAGNOSIS — I10 Essential (primary) hypertension: Secondary | ICD-10-CM

## 2015-12-10 DIAGNOSIS — E039 Hypothyroidism, unspecified: Secondary | ICD-10-CM

## 2015-12-10 DIAGNOSIS — E876 Hypokalemia: Secondary | ICD-10-CM

## 2015-12-10 DIAGNOSIS — Z6841 Body Mass Index (BMI) 40.0 and over, adult: Secondary | ICD-10-CM

## 2015-12-10 DIAGNOSIS — R1110 Vomiting, unspecified: Secondary | ICD-10-CM | POA: Diagnosis not present

## 2015-12-10 DIAGNOSIS — Z833 Family history of diabetes mellitus: Secondary | ICD-10-CM

## 2015-12-10 DIAGNOSIS — J449 Chronic obstructive pulmonary disease, unspecified: Secondary | ICD-10-CM

## 2015-12-10 DIAGNOSIS — F32A Depression, unspecified: Secondary | ICD-10-CM

## 2015-12-10 DIAGNOSIS — G479 Sleep disorder, unspecified: Secondary | ICD-10-CM | POA: Diagnosis present

## 2015-12-10 DIAGNOSIS — R531 Weakness: Secondary | ICD-10-CM | POA: Diagnosis present

## 2015-12-10 DIAGNOSIS — K111 Hypertrophy of salivary gland: Secondary | ICD-10-CM

## 2015-12-10 DIAGNOSIS — Z598 Other problems related to housing and economic circumstances: Secondary | ICD-10-CM

## 2015-12-10 DIAGNOSIS — J189 Pneumonia, unspecified organism: Secondary | ICD-10-CM

## 2015-12-10 DIAGNOSIS — A6920 Lyme disease, unspecified: Secondary | ICD-10-CM | POA: Diagnosis present

## 2015-12-10 DIAGNOSIS — R1011 Right upper quadrant pain: Secondary | ICD-10-CM | POA: Diagnosis present

## 2015-12-10 DIAGNOSIS — A419 Sepsis, unspecified organism: Secondary | ICD-10-CM

## 2015-12-10 DIAGNOSIS — K219 Gastro-esophageal reflux disease without esophagitis: Secondary | ICD-10-CM | POA: Diagnosis present

## 2015-12-10 DIAGNOSIS — F329 Major depressive disorder, single episode, unspecified: Secondary | ICD-10-CM | POA: Diagnosis present

## 2015-12-10 DIAGNOSIS — Z794 Long term (current) use of insulin: Secondary | ICD-10-CM

## 2015-12-10 DIAGNOSIS — R109 Unspecified abdominal pain: Secondary | ICD-10-CM

## 2015-12-10 DIAGNOSIS — L989 Disorder of the skin and subcutaneous tissue, unspecified: Secondary | ICD-10-CM

## 2015-12-10 DIAGNOSIS — Z7189 Other specified counseling: Secondary | ICD-10-CM

## 2015-12-10 DIAGNOSIS — R112 Nausea with vomiting, unspecified: Secondary | ICD-10-CM | POA: Diagnosis present

## 2015-12-10 DIAGNOSIS — D649 Anemia, unspecified: Secondary | ICD-10-CM | POA: Diagnosis present

## 2015-12-10 DIAGNOSIS — D509 Iron deficiency anemia, unspecified: Secondary | ICD-10-CM

## 2015-12-10 DIAGNOSIS — Z87891 Personal history of nicotine dependence: Secondary | ICD-10-CM

## 2015-12-10 DIAGNOSIS — Z882 Allergy status to sulfonamides status: Secondary | ICD-10-CM

## 2015-12-10 DIAGNOSIS — Z515 Encounter for palliative care: Secondary | ICD-10-CM

## 2015-12-10 DIAGNOSIS — E119 Type 2 diabetes mellitus without complications: Secondary | ICD-10-CM | POA: Diagnosis present

## 2015-12-10 DIAGNOSIS — N390 Urinary tract infection, site not specified: Secondary | ICD-10-CM

## 2015-12-10 DIAGNOSIS — G609 Hereditary and idiopathic neuropathy, unspecified: Secondary | ICD-10-CM

## 2015-12-10 DIAGNOSIS — Y846 Urinary catheterization as the cause of abnormal reaction of the patient, or of later complication, without mention of misadventure at the time of the procedure: Secondary | ICD-10-CM | POA: Diagnosis present

## 2015-12-10 DIAGNOSIS — Z886 Allergy status to analgesic agent status: Secondary | ICD-10-CM

## 2015-12-10 LAB — COMPREHENSIVE METABOLIC PANEL
ALBUMIN: 3.7 g/dL (ref 3.5–5.0)
ALK PHOS: 68 U/L (ref 38–126)
ALT: 22 U/L (ref 14–54)
ANION GAP: 11 (ref 5–15)
AST: 24 U/L (ref 15–41)
BILIRUBIN TOTAL: 0.6 mg/dL (ref 0.3–1.2)
BUN: 12 mg/dL (ref 6–20)
CALCIUM: 9.1 mg/dL (ref 8.9–10.3)
CO2: 37 mmol/L — AB (ref 22–32)
Chloride: 91 mmol/L — ABNORMAL LOW (ref 101–111)
Creatinine, Ser: 0.89 mg/dL (ref 0.44–1.00)
GFR calc Af Amer: >60 mL/min (ref >60)
GFR calc non Af Amer: >60 mL/min (ref >60)
GLUCOSE: 159 mg/dL — AB (ref 65–99)
POTASSIUM: 3.2 mmol/L — AB (ref 3.5–5.1)
SODIUM: 139 mmol/L (ref 135–145)
Total Protein: 7.4 g/dL (ref 6.5–8.1)

## 2015-12-10 LAB — CBC WITH DIFFERENTIAL/PLATELET
Basophils Absolute: 0 10*3/uL (ref 0.0–0.1)
Basophils Relative: 0 %
EOS PCT: 0 %
Eosinophils Absolute: 0 10*3/uL (ref 0.0–0.7)
HEMATOCRIT: 34.9 % — AB (ref 36.0–46.0)
Hemoglobin: 10.6 g/dL — ABNORMAL LOW (ref 12.0–15.0)
LYMPHS PCT: 7 %
Lymphs Abs: 0.4 10*3/uL — ABNORMAL LOW (ref 0.7–4.0)
MCH: 27.5 pg (ref 26.0–34.0)
MCHC: 30.4 g/dL (ref 30.0–36.0)
MCV: 90.4 fL (ref 78.0–100.0)
MONO ABS: 0.7 10*3/uL (ref 0.1–1.0)
MONOS PCT: 12 %
NEUTROS ABS: 4.9 10*3/uL (ref 1.7–7.7)
Neutrophils Relative %: 81 %
PLATELETS: 214 10*3/uL (ref 150–400)
RBC: 3.86 MIL/uL — ABNORMAL LOW (ref 3.87–5.11)
RDW: 15.8 % — AB (ref 11.5–15.5)
WBC: 6 10*3/uL (ref 4.0–10.5)

## 2015-12-10 LAB — I-STAT CG4 LACTIC ACID, ED: Lactic Acid, Venous: 2.24 mmol/L (ref 0.5–2.0)

## 2015-12-10 MED ORDER — SODIUM CHLORIDE 0.9 % IV BOLUS (SEPSIS)
1000.0000 mL | Freq: Once | INTRAVENOUS | Status: AC
  Administered 2015-12-10: 1000 mL via INTRAVENOUS

## 2015-12-10 MED ORDER — DEXTROSE 5 % IV SOLN
2.0000 g | Freq: Once | INTRAVENOUS | Status: AC
  Administered 2015-12-10: 2 g via INTRAVENOUS
  Filled 2015-12-10: qty 2

## 2015-12-10 MED ORDER — ONDANSETRON HCL 4 MG/2ML IJ SOLN
4.0000 mg | Freq: Once | INTRAMUSCULAR | Status: AC
  Administered 2015-12-10: 4 mg via INTRAVENOUS
  Filled 2015-12-10: qty 2

## 2015-12-10 MED ORDER — IOHEXOL 300 MG/ML  SOLN
100.0000 mL | Freq: Once | INTRAMUSCULAR | Status: AC | PRN
  Administered 2015-12-10: 100 mL via INTRAVENOUS

## 2015-12-10 MED ORDER — ACETAMINOPHEN 325 MG PO TABS
650.0000 mg | ORAL_TABLET | Freq: Once | ORAL | Status: AC
  Administered 2015-12-10: 650 mg via ORAL
  Filled 2015-12-10: qty 2

## 2015-12-10 MED ORDER — MORPHINE SULFATE (PF) 4 MG/ML IV SOLN
4.0000 mg | Freq: Once | INTRAVENOUS | Status: AC
  Administered 2015-12-10: 4 mg via INTRAVENOUS
  Filled 2015-12-10: qty 1

## 2015-12-10 MED ORDER — SODIUM CHLORIDE 0.9 % IV BOLUS (SEPSIS)
1000.0000 mL | Freq: Once | INTRAVENOUS | Status: AC
Start: 2015-12-10 — End: 2015-12-10
  Administered 2015-12-10: 1000 mL via INTRAVENOUS

## 2015-12-10 MED ORDER — DEXTROSE 5 % IV SOLN
2.0000 g | Freq: Three times a day (TID) | INTRAVENOUS | Status: DC
  Administered 2015-12-11 – 2015-12-15 (×13): 2 g via INTRAVENOUS
  Filled 2015-12-10 (×14): qty 2

## 2015-12-10 MED ORDER — VANCOMYCIN HCL 10 G IV SOLR
2500.0000 mg | Freq: Once | INTRAVENOUS | Status: AC
  Administered 2015-12-10: 2500 mg via INTRAVENOUS
  Filled 2015-12-10: qty 500

## 2015-12-10 NOTE — Progress Notes (Signed)
ANTIBIOTIC CONSULT NOTE - INITIAL  Pharmacy Consult for Fortaz/Vancomycin Indication: Sepsis  Allergies  Allergen Reactions  . Ibuprofen Other (See Comments) and Shortness Of Breath  . Sulfa Antibiotics Other (See Comments), Itching and Shortness Of Breath  . Levofloxacin Nausea Only    Other fluoroquinolones OK.  . Lisinopril Other (See Comments)  . Penicillins Other (See Comments) and Hives    Patient Measurements: Weight: (!) 303 lb (137.44 kg)   Vital Signs: Temp: 102.6 F (39.2 C) (12/14 2153) Temp Source: Oral (12/14 2153) BP: 134/76 mmHg (12/14 2153) Pulse Rate: 73 (12/14 2153) Intake/Output from previous day:   Intake/Output from this shift:    Labs:  Recent Labs  12/10/15 2201  WBC 6.0  HGB 10.6*  PLT 214   CrCl cannot be calculated (Patient has no serum creatinine result on file.). No results for input(s): VANCOTROUGH, VANCOPEAK, VANCORANDOM, GENTTROUGH, GENTPEAK, GENTRANDOM, TOBRATROUGH, TOBRAPEAK, TOBRARND, AMIKACINPEAK, AMIKACINTROU, AMIKACIN in the last 72 hours.   Microbiology: No results found for this or any previous visit (from the past 720 hour(s)).  Medical History: Past Medical History  Diagnosis Date  . Diabetes mellitus without complication (HCC)   . Chronic pain   . Morbid obesity (HCC)   . OSA (obstructive sleep apnea)   . Generalized weakness   . Fibromyalgia   . Lyme disease   . Anemia   . History of blood transfusion     pt states she has had 5 blood transfusions  . Sleep disorder   . UTI (lower urinary tract infection) 02/22/2014  . Hypertension   . Hypothyroidism   . COPD (chronic obstructive pulmonary disease) (HCC)   . Shortness of breath   . GERD (gastroesophageal reflux disease)     Medications:   (Not in a hospital admission) Scheduled:  .  morphine injection  4 mg Intravenous Once  . ondansetron (ZOFRAN) IV  4 mg Intravenous Once   Infusions:  . cefTAZidime (FORTAZ)  IV    . sodium chloride    . vancomycin      Assessment: [age] yoF from SNF c/o generalized sickness.  Fortaz/Vancomycin per Rx for Sepsis.   Goal of Therapy:  Vancomycin trough level 15-20 mcg/ml  Plan:   [name] 2Gm q8h   Vancomycin 2500mg  x1 then 1Gm IV q12h  F/u SCr/cultures/levels as needed  Susanne GreenhouseGreen, Lyndsey Demos R 12/10/2015,10:48 PM

## 2015-12-10 NOTE — ED Notes (Addendum)
Per ems: Patient comes from Nursing home and patient has been vomiting. The patient was seen by MD earlier today. The patient is refusing to take the meds and the phenergan to help her with the vomiting. Per Nursing home the Md advised against transport to the ED -  But the patient was insisant to come to the ER. The patient is uncooperative and yelling that she is having generalized pain. Multiple notes of the patients non compliance - patient asking for water continously,.

## 2015-12-10 NOTE — ED Notes (Signed)
Bed: WA04 Expected date:  Expected time:  Means of arrival:  Comments: EMS 54 yo female from facility, "generalized sickness", non-compliance with medications

## 2015-12-10 NOTE — ED Notes (Signed)
New foley has scant urine output at this time not sufficient for sample.  Will continue to monitor to get urine sample asap.

## 2015-12-10 NOTE — ED Notes (Signed)
Patient continually yelling and states that she is hurting all over. Does not answer questions appropriately just continues to moan and yell in pain all over.

## 2015-12-11 DIAGNOSIS — Z79899 Other long term (current) drug therapy: Secondary | ICD-10-CM | POA: Diagnosis not present

## 2015-12-11 DIAGNOSIS — Z833 Family history of diabetes mellitus: Secondary | ICD-10-CM | POA: Diagnosis not present

## 2015-12-11 DIAGNOSIS — Z881 Allergy status to other antibiotic agents status: Secondary | ICD-10-CM | POA: Diagnosis not present

## 2015-12-11 DIAGNOSIS — R1011 Right upper quadrant pain: Secondary | ICD-10-CM | POA: Diagnosis present

## 2015-12-11 DIAGNOSIS — Z880 Allergy status to penicillin: Secondary | ICD-10-CM | POA: Diagnosis not present

## 2015-12-11 DIAGNOSIS — M797 Fibromyalgia: Secondary | ICD-10-CM | POA: Diagnosis present

## 2015-12-11 DIAGNOSIS — Z6841 Body Mass Index (BMI) 40.0 and over, adult: Secondary | ICD-10-CM | POA: Diagnosis not present

## 2015-12-11 DIAGNOSIS — I10 Essential (primary) hypertension: Secondary | ICD-10-CM | POA: Diagnosis present

## 2015-12-11 DIAGNOSIS — G894 Chronic pain syndrome: Secondary | ICD-10-CM

## 2015-12-11 DIAGNOSIS — Z886 Allergy status to analgesic agent status: Secondary | ICD-10-CM | POA: Diagnosis not present

## 2015-12-11 DIAGNOSIS — Z515 Encounter for palliative care: Secondary | ICD-10-CM | POA: Diagnosis not present

## 2015-12-11 DIAGNOSIS — Z7189 Other specified counseling: Secondary | ICD-10-CM | POA: Diagnosis not present

## 2015-12-11 DIAGNOSIS — E039 Hypothyroidism, unspecified: Secondary | ICD-10-CM | POA: Diagnosis present

## 2015-12-11 DIAGNOSIS — E662 Morbid (severe) obesity with alveolar hypoventilation: Secondary | ICD-10-CM | POA: Diagnosis present

## 2015-12-11 DIAGNOSIS — T83518A Infection and inflammatory reaction due to other urinary catheter, initial encounter: Secondary | ICD-10-CM | POA: Diagnosis present

## 2015-12-11 DIAGNOSIS — R1110 Vomiting, unspecified: Secondary | ICD-10-CM | POA: Diagnosis present

## 2015-12-11 DIAGNOSIS — N390 Urinary tract infection, site not specified: Secondary | ICD-10-CM | POA: Diagnosis present

## 2015-12-11 DIAGNOSIS — R531 Weakness: Secondary | ICD-10-CM | POA: Diagnosis present

## 2015-12-11 DIAGNOSIS — Z888 Allergy status to other drugs, medicaments and biological substances status: Secondary | ICD-10-CM | POA: Diagnosis not present

## 2015-12-11 DIAGNOSIS — K219 Gastro-esophageal reflux disease without esophagitis: Secondary | ICD-10-CM | POA: Diagnosis present

## 2015-12-11 DIAGNOSIS — F329 Major depressive disorder, single episode, unspecified: Secondary | ICD-10-CM | POA: Diagnosis present

## 2015-12-11 DIAGNOSIS — E876 Hypokalemia: Secondary | ICD-10-CM

## 2015-12-11 DIAGNOSIS — E118 Type 2 diabetes mellitus with unspecified complications: Secondary | ICD-10-CM | POA: Diagnosis not present

## 2015-12-11 DIAGNOSIS — Z882 Allergy status to sulfonamides status: Secondary | ICD-10-CM | POA: Diagnosis not present

## 2015-12-11 DIAGNOSIS — J449 Chronic obstructive pulmonary disease, unspecified: Secondary | ICD-10-CM | POA: Diagnosis present

## 2015-12-11 DIAGNOSIS — A6920 Lyme disease, unspecified: Secondary | ICD-10-CM | POA: Diagnosis present

## 2015-12-11 DIAGNOSIS — D649 Anemia, unspecified: Secondary | ICD-10-CM | POA: Diagnosis present

## 2015-12-11 DIAGNOSIS — Z794 Long term (current) use of insulin: Secondary | ICD-10-CM | POA: Diagnosis not present

## 2015-12-11 DIAGNOSIS — R112 Nausea with vomiting, unspecified: Secondary | ICD-10-CM | POA: Diagnosis present

## 2015-12-11 DIAGNOSIS — G479 Sleep disorder, unspecified: Secondary | ICD-10-CM | POA: Diagnosis present

## 2015-12-11 DIAGNOSIS — Z87891 Personal history of nicotine dependence: Secondary | ICD-10-CM | POA: Diagnosis not present

## 2015-12-11 DIAGNOSIS — R109 Unspecified abdominal pain: Secondary | ICD-10-CM | POA: Diagnosis present

## 2015-12-11 DIAGNOSIS — Z66 Do not resuscitate: Secondary | ICD-10-CM | POA: Diagnosis present

## 2015-12-11 DIAGNOSIS — E119 Type 2 diabetes mellitus without complications: Secondary | ICD-10-CM | POA: Diagnosis present

## 2015-12-11 DIAGNOSIS — Y846 Urinary catheterization as the cause of abnormal reaction of the patient, or of later complication, without mention of misadventure at the time of the procedure: Secondary | ICD-10-CM | POA: Diagnosis present

## 2015-12-11 HISTORY — DX: Hypokalemia: E87.6

## 2015-12-11 LAB — URINALYSIS, ROUTINE W REFLEX MICROSCOPIC
Glucose, UA: NEGATIVE mg/dL
Ketones, ur: NEGATIVE mg/dL
Nitrite: NEGATIVE
PH: 8.5 — AB (ref 5.0–8.0)
Protein, ur: 100 mg/dL
SPECIFIC GRAVITY, URINE: 1.027 (ref 1.005–1.030)

## 2015-12-11 LAB — MRSA PCR SCREENING: MRSA by PCR: POSITIVE — AB

## 2015-12-11 LAB — GLUCOSE, CAPILLARY
GLUCOSE-CAPILLARY: 94 mg/dL (ref 65–99)
GLUCOSE-CAPILLARY: 126 mg/dL — AB (ref 65–99)
Glucose-Capillary: 116 mg/dL — ABNORMAL HIGH (ref 65–99)
Glucose-Capillary: 117 mg/dL — ABNORMAL HIGH (ref 65–99)
Glucose-Capillary: 147 mg/dL — ABNORMAL HIGH (ref 65–99)

## 2015-12-11 LAB — URINE MICROSCOPIC-ADD ON

## 2015-12-11 LAB — INFLUENZA PANEL BY PCR (TYPE A & B)
H1N1 flu by pcr: NOT DETECTED
Influenza A By PCR: NEGATIVE
Influenza B By PCR: NEGATIVE

## 2015-12-11 LAB — I-STAT CG4 LACTIC ACID, ED: LACTIC ACID, VENOUS: 0.65 mmol/L (ref 0.5–2.0)

## 2015-12-11 MED ORDER — BENZOCAINE 10 % MT GEL: 1.0000 "application " | Freq: Three times a day (TID) | OROMUCOSAL | Status: DC | PRN

## 2015-12-11 MED ORDER — DIAZEPAM 5 MG PO TABS: 5.0000 mg | ORAL_TABLET | Freq: Two times a day (BID) | ORAL | Status: DC | PRN

## 2015-12-11 MED ORDER — PHENAZOPYRIDINE HCL 100 MG PO TABS
100.0000 mg | ORAL_TABLET | Freq: Three times a day (TID) | ORAL | Status: DC
  Administered 2015-12-11 – 2015-12-14 (×9): 100 mg via ORAL
  Filled 2015-12-11 (×11): qty 1

## 2015-12-11 MED ORDER — INSULIN ASPART 100 UNIT/ML ~~LOC~~ SOLN: 0.0000 [IU] | Freq: Every day | SUBCUTANEOUS | Status: DC

## 2015-12-11 MED ORDER — NITROGLYCERIN 0.4 MG SL SUBL: 0.4000 mg | SUBLINGUAL_TABLET | SUBLINGUAL | Status: DC | PRN

## 2015-12-11 MED ORDER — POTASSIUM CHLORIDE CRYS ER 20 MEQ PO TBCR
40.0000 meq | EXTENDED_RELEASE_TABLET | Freq: Once | ORAL | Status: AC
  Administered 2015-12-11: 40 meq via ORAL
  Filled 2015-12-11: qty 2

## 2015-12-11 MED ORDER — INSULIN LISPRO 100 UNIT/ML ~~LOC~~ SOLN: 0.0000 [IU] | SUBCUTANEOUS | Status: DC

## 2015-12-11 MED ORDER — HEPARIN SODIUM (PORCINE) 5000 UNIT/ML IJ SOLN
5000.0000 [IU] | Freq: Three times a day (TID) | INTRAMUSCULAR | Status: DC
  Administered 2015-12-11 – 2015-12-15 (×11): 5000 [IU] via SUBCUTANEOUS
  Filled 2015-12-11 (×16): qty 1

## 2015-12-11 MED ORDER — VANCOMYCIN HCL IN DEXTROSE 1-5 GM/200ML-% IV SOLN: 1000.0000 mg | Freq: Two times a day (BID) | INTRAVENOUS | Status: DC

## 2015-12-11 MED ORDER — BISACODYL 5 MG PO TBEC
5.0000 mg | DELAYED_RELEASE_TABLET | ORAL | Status: DC
Start: 2015-12-11 — End: 2015-12-15
  Administered 2015-12-11 – 2015-12-15 (×3): 5 mg via ORAL
  Filled 2015-12-11 (×3): qty 1

## 2015-12-11 MED ORDER — DIAZEPAM 5 MG PO TABS
10.0000 mg | ORAL_TABLET | Freq: Every evening | ORAL | Status: DC | PRN
  Administered 2015-12-11 – 2015-12-14 (×3): 10 mg via ORAL
  Filled 2015-12-11 (×3): qty 2

## 2015-12-11 MED ORDER — POLYETHYLENE GLYCOL 3350 17 G PO PACK
17.0000 g | PACK | Freq: Two times a day (BID) | ORAL | Status: DC
  Administered 2015-12-11 – 2015-12-15 (×7): 17 g via ORAL
  Filled 2015-12-11 (×11): qty 1

## 2015-12-11 MED ORDER — OXYBUTYNIN CHLORIDE 5 MG PO TABS
5.0000 mg | ORAL_TABLET | Freq: Two times a day (BID) | ORAL | Status: DC
Start: 2015-12-11 — End: 2015-12-15
  Administered 2015-12-11 – 2015-12-15 (×9): 5 mg via ORAL
  Filled 2015-12-11 (×13): qty 1

## 2015-12-11 MED ORDER — OXYCODONE HCL 5 MG PO TABS
10.0000 mg | ORAL_TABLET | ORAL | Status: DC | PRN
  Administered 2015-12-11 – 2015-12-15 (×12): 10 mg via ORAL
  Filled 2015-12-11 (×12): qty 2

## 2015-12-11 MED ORDER — LEVOTHYROXINE SODIUM 125 MCG PO TABS
250.0000 ug | ORAL_TABLET | Freq: Every day | ORAL | Status: DC
  Administered 2015-12-11 – 2015-12-15 (×5): 250 ug via ORAL
  Filled 2015-12-11 (×6): qty 2

## 2015-12-11 MED ORDER — LUBIPROSTONE 24 MCG PO CAPS
24.0000 ug | ORAL_CAPSULE | Freq: Two times a day (BID) | ORAL | Status: DC
  Administered 2015-12-11 – 2015-12-15 (×9): 24 ug via ORAL
  Filled 2015-12-11 (×11): qty 1

## 2015-12-11 MED ORDER — GUAIFENESIN 100 MG/5ML PO SOLN: 200.0000 mg | ORAL | Status: DC | PRN

## 2015-12-11 MED ORDER — HYDROCHLOROTHIAZIDE 25 MG PO TABS: 25.0000 mg | ORAL_TABLET | Freq: Every day | ORAL | Status: DC

## 2015-12-11 MED ORDER — PANTOPRAZOLE SODIUM 40 MG PO TBEC
40.0000 mg | DELAYED_RELEASE_TABLET | Freq: Every day | ORAL | Status: DC
  Administered 2015-12-11 – 2015-12-15 (×5): 40 mg via ORAL
  Filled 2015-12-11 (×5): qty 1

## 2015-12-11 MED ORDER — INSULIN ASPART PROT & ASPART (70-30 MIX) 100 UNIT/ML ~~LOC~~ SUSP
25.0000 [IU] | Freq: Two times a day (BID) | SUBCUTANEOUS | Status: DC
  Administered 2015-12-11 – 2015-12-15 (×9): 25 [IU] via SUBCUTANEOUS
  Filled 2015-12-11 (×2): qty 10

## 2015-12-11 MED ORDER — ACETAMINOPHEN 325 MG PO TABS: 650.0000 mg | ORAL_TABLET | ORAL | Status: DC | PRN

## 2015-12-11 MED ORDER — POTASSIUM CHLORIDE CRYS ER 10 MEQ PO TBCR: 10.0000 meq | EXTENDED_RELEASE_TABLET | Freq: Every day | ORAL | Status: DC

## 2015-12-11 MED ORDER — TRAZODONE HCL 100 MG PO TABS
100.0000 mg | ORAL_TABLET | Freq: Every day | ORAL | Status: DC
  Administered 2015-12-11 – 2015-12-12 (×2): 100 mg via ORAL
  Filled 2015-12-11 (×5): qty 1

## 2015-12-11 MED ORDER — SUCRALFATE 1 G PO TABS
1.0000 g | ORAL_TABLET | Freq: Three times a day (TID) | ORAL | Status: DC
  Administered 2015-12-11 – 2015-12-15 (×18): 1 g via ORAL
  Filled 2015-12-11 (×21): qty 1

## 2015-12-11 MED ORDER — BUPROPION HCL ER (XL) 300 MG PO TB24
300.0000 mg | ORAL_TABLET | Freq: Every day | ORAL | Status: DC
  Administered 2015-12-11 – 2015-12-15 (×5): 300 mg via ORAL
  Filled 2015-12-11 (×5): qty 1

## 2015-12-11 MED ORDER — PAROXETINE HCL ER 25 MG PO TB24
25.0000 mg | ORAL_TABLET | Freq: Every day | ORAL | Status: DC
  Administered 2015-12-11 – 2015-12-15 (×5): 25 mg via ORAL
  Filled 2015-12-11 (×6): qty 1

## 2015-12-11 MED ORDER — METHADONE HCL 10 MG PO TABS
30.0000 mg | ORAL_TABLET | Freq: Three times a day (TID) | ORAL | Status: DC
  Administered 2015-12-11 – 2015-12-15 (×13): 30 mg via ORAL
  Filled 2015-12-11 (×13): qty 3

## 2015-12-11 MED ORDER — INSULIN ASPART 100 UNIT/ML ~~LOC~~ SOLN
0.0000 [IU] | Freq: Three times a day (TID) | SUBCUTANEOUS | Status: DC
  Administered 2015-12-11 – 2015-12-14 (×3): 1 [IU] via SUBCUTANEOUS
  Administered 2015-12-15: 2 [IU] via SUBCUTANEOUS
  Administered 2015-12-15: 1 [IU] via SUBCUTANEOUS

## 2015-12-11 MED ORDER — POLYVINYL ALCOHOL 1.4 % OP SOLN
1.0000 [drp] | Freq: Two times a day (BID) | OPHTHALMIC | Status: DC
  Administered 2015-12-11 – 2015-12-15 (×9): 1 [drp] via OPHTHALMIC
  Filled 2015-12-11: qty 15

## 2015-12-11 MED ORDER — GABAPENTIN 300 MG PO CAPS
300.0000 mg | ORAL_CAPSULE | Freq: Two times a day (BID) | ORAL | Status: DC
  Administered 2015-12-11 – 2015-12-15 (×9): 300 mg via ORAL
  Filled 2015-12-11 (×10): qty 1

## 2015-12-11 MED ORDER — DOCUSATE SODIUM 100 MG PO CAPS
200.0000 mg | ORAL_CAPSULE | Freq: Two times a day (BID) | ORAL | Status: DC
  Administered 2015-12-11 – 2015-12-15 (×9): 200 mg via ORAL
  Filled 2015-12-11 (×11): qty 2

## 2015-12-11 MED ORDER — DIAZEPAM 5 MG PO TABS
5.0000 mg | ORAL_TABLET | Freq: Every day | ORAL | Status: DC | PRN
  Administered 2015-12-11 – 2015-12-14 (×4): 5 mg via ORAL
  Filled 2015-12-11 (×4): qty 1

## 2015-12-11 NOTE — Progress Notes (Signed)
Patient not ready for CPAP at this time. Patient stated that she would call when ready. RT will continue to monitor.

## 2015-12-11 NOTE — Progress Notes (Signed)
PT Cancellation Note  Patient Details Name: Karen BarrioChristy Fleig MRN: [id number] DOB: 09/20/1961   Cancelled Treatment:    Reason Eval/Treat Not Completed: Pain limiting ability to participate. Pt requested PT check back another day.    Rebeca AlertJannie Kemi Gell, MPT Pager: [phone]

## 2015-12-11 NOTE — Progress Notes (Signed)
Patient admitted after midnight, please see H&P.  Await culture-- will need to return to SNF when medically ready.  Marlin CanaryJessica Vann DO

## 2015-12-11 NOTE — H&P (Signed)
[facility] History and Physical  Asli Tokarski ZOX:[id number] DOB: 24-Dec-1961 DOA: 12/10/2015  Referring physician: EDP PCP: Terald Sleeper, MD   Chief Complaint: Vomiting   HPI: [name] is a 54 y.o. female who comes in from her SNF with c/o vomiting since earlier in the day.  Patient was prescribed phenergan which she refused to take and instead insisted on coming to ED.  In ED patient was found to have fever of 102.6, secondary to UTI.  Patient being admitted for UTI treatment especially as recent UTI last month had pseudomonas and a MDRO P. Stuartii.  Review of Systems: Systems reviewed.  As above, otherwise negative  Past Medical History  Diagnosis Date  . Diabetes mellitus without complication (HCC)   . Chronic pain   . Morbid obesity (HCC)   . OSA (obstructive sleep apnea)   . Generalized weakness   . Fibromyalgia   . Lyme disease   . Anemia   . History of blood transfusion     pt states she has had 5 blood transfusions  . Sleep disorder   . UTI (lower urinary tract infection) 02/22/2014  . Hypertension   . Hypothyroidism   . COPD (chronic obstructive pulmonary disease) (HCC)   . Shortness of breath   . GERD (gastroesophageal reflux disease)    Past Surgical History  Procedure Laterality Date  . Explaratory      on stomach,   . Appendectomy    . Foot surgery      due to stepping on broken glass  . Spinal tap    . Supra pubic catheter    . Esophagogastroduodenoscopy (egd) with propofol N/A 03/05/2014    Procedure: ESOPHAGOGASTRODUODENOSCOPY (EGD) WITH PROPOFOL;  Surgeon: Willis Modena, MD;  Location: [facility];  Service: Endoscopy;  Laterality: N/A;  . Colonoscopy with propofol N/A 03/05/2014    Procedure: COLONOSCOPY WITH PROPOFOL;  Surgeon: Willis Modena, MD;  Location: [facility];  Service: Endoscopy;  Laterality: N/A;  . Dental restoration/extraction with x-ray    . Colonoscopy with propofol N/A 03/06/2014    Procedure:  COLONOSCOPY WITH PROPOFOL;  Surgeon: Willis Modena, MD;  Location: [facility];  Service: Endoscopy;  Laterality: N/A;   Social History:  reports that she quit smoking about 7 years ago. She does not have any smokeless tobacco history on file. She reports that she does not drink alcohol or use illicit drugs.  Allergies  Allergen Reactions  . Ibuprofen Other (See Comments) and Shortness Of Breath  . Sulfa Antibiotics Other (See Comments), Itching and Shortness Of Breath  . Levofloxacin Nausea Only    Other fluoroquinolones OK.  . Lisinopril Other (See Comments)  . Penicillins Other (See Comments) and Hives    Family History  Problem Relation Age of Onset  . Diabetes Mellitus II Mother      Prior to Admission medications   Medication Sig Start Date End Date Taking? Authorizing Provider  acetaminophen (TYLENOL) 325 MG tablet Take 650 mg by mouth every 4 (four) hours as needed for mild pain.    Historical Provider, MD  benzocaine (ORAJEL) 10 % mucosal gel Use as directed 1 application in the mouth or throat 3 (three) times daily as needed for mouth pain.    Historical Provider, MD  bisacodyl (DULCOLAX) 5 MG EC tablet Take 2 tablets (10 mg total) by mouth daily. Patient taking differently: Take 5 mg by mouth every other day.  03/08/14   Leroy Sea, MD  buPROPion (WELLBUTRIN XL) 300 MG 24  hr tablet Take 300 mg by mouth daily.    Historical Provider, MD  cloNIDine (CATAPRES) 0.1 MG tablet Take 0.1 mg by mouth as needed.    Historical Provider, MD  diazepam (VALIUM) 5 MG tablet Take 1-2 tablets (5-10 mg total) by mouth every 12 (twelve) hours as needed for anxiety (TAKES  IN AM AND  IN PM, ONLY AS NEEDED FOR ANXIETY). 11/12/15   Dorothea Ogle, MD  docusate sodium 100 MG CAPS Take 200 mg by mouth 2 (two) times daily. 03/08/14   Leroy Sea, MD  gabapentin (NEURONTIN) 300 MG capsule Take 300 mg by mouth 2 (two) times daily.    Historical Provider, MD  guaifenesin (ROBITUSSIN)  100 MG/5ML syrup Take 200 mg by mouth every 4 (four) hours as needed for cough.    Historical Provider, MD  hydrochlorothiazide (HYDRODIURIL) 25 MG tablet Take 25 mg by mouth daily.    Historical Provider, MD  insulin lispro (HUMALOG) 100 UNIT/ML injection Inject 0-8 Units into the skin See admin instructions. Sliding Scale - Inject before each meal and at bed time    Historical Provider, MD  insulin NPH-regular Human (NOVOLIN 70/30) (70-30) 100 UNIT/ML injection Inject 25 Units into the skin 2 (two) times daily with a meal. 03/11/14   Leroy Sea, MD  levothyroxine (SYNTHROID, LEVOTHROID) 125 MCG tablet Take 250 mcg by mouth daily before breakfast.    Historical Provider, MD  lubiprostone (AMITIZA) 24 MCG capsule Take 24 mcg by mouth 2 (two) times daily with a meal.    Historical Provider, MD  methadone (DOLOPHINE) 10 MG tablet Take three tablets by mouth three times daily for pain 12/08/15   Tiffany L Reed, DO  nitroGLYCERIN (NITROSTAT) 0.4 MG SL tablet Place 0.4 mg under the tongue every 5 (five) minutes as needed for chest pain.    Historical Provider, MD  omeprazole (PRILOSEC) 20 MG capsule Take 20 mg by mouth daily.    Historical Provider, MD  oxybutynin (DITROPAN) 5 MG tablet Take 5 mg by mouth 2 (two) times daily.     Historical Provider, MD  Oxycodone HCl 10 MG TABS Take one tablet by mouth every 4 hours as needed for pain 11/11/15   Kathlen Mody, MD  PARoxetine (PAXIL-CR) 25 MG 24 hr tablet Take 1 tablet (25 mg total) by mouth daily. 10/31/15   Costin Otelia Sergeant, MD  phenazopyridine (PYRIDIUM) 200 MG tablet Take 200 mg by mouth 3 (three) times daily as needed for pain.    Historical Provider, MD  polyethylene glycol (MIRALAX / GLYCOLAX) packet Take 17 g by mouth 2 (two) times daily. 03/08/14   Leroy Sea, MD  Polyvinyl Alcohol-Povidone (FRESHKOTE) 2.7-2 % SOLN Place 1 drop into both eyes 2 (two) times daily.    Historical Provider, MD  potassium chloride (MICRO-K) 10 MEQ CR capsule  Take 10 mEq by mouth daily.    Historical Provider, MD  sucralfate (CARAFATE) 1 G tablet Take 1 g by mouth 4 (four) times daily -  with meals and at bedtime.    Historical Provider, MD  traZODone (DESYREL) 100 MG tablet Take 100 mg by mouth at bedtime.    Historical Provider, MD  Vitamin D, Ergocalciferol, (DRISDOL) 50000 UNITS CAPS capsule Take 50,000 Units by mouth every 14 (fourteen) days.    Historical Provider, MD   Physical Exam: Filed Vitals:   12/10/15 2153  BP: 134/76  Pulse: 73  Temp: 102.6 F (39.2 C)  Resp: 14  BP 134/76 mmHg  Pulse 73  Temp(Src) 102.6 F (39.2 C) (Oral)  Resp 14  Wt 137.44 kg (303 lb)  SpO2 98%  General Appearance:    Alert, oriented, no distress, appears stated age  Head:    Normocephalic, atraumatic  Eyes:    PERRL, EOMI, sclera non-icteric        Nose:   Nares without drainage or epistaxis. Mucosa, turbinates normal  Throat:   Moist mucous membranes. Oropharynx without erythema or exudate.  Neck:   Supple. No carotid bruits.  No thyromegaly.  No lymphadenopathy.   Back:     No CVA tenderness, no spinal tenderness  Lungs:     Clear to auscultation bilaterally, without wheezes, rhonchi or rales  Chest wall:    No tenderness to palpitation  Heart:    Regular rate and rhythm without murmurs, gallops, rubs  Abdomen:     Soft, non-tender, nondistended, normal bowel sounds, no organomegaly  Genitalia:    deferred  Rectal:    deferred  Extremities:   No clubbing, cyanosis or edema.  Pulses:   2+ and symmetric all extremities  Skin:   Skin color, texture, turgor normal, no rashes or lesions  Lymph nodes:   Cervical, supraclavicular, and axillary nodes normal  Neurologic:   CNII-XII intact. Normal strength, sensation and reflexes      throughout    Labs on Admission:  Basic Metabolic Panel:  Recent Labs Lab 12/10/15 2201  NA 139  K 3.2*  CL 91*  CO2 37*  GLUCOSE 159*  BUN 12  CREATININE 0.89  CALCIUM 9.1   Liver Function  Tests:  Recent Labs Lab 12/10/15 2201  AST 24  ALT 22  ALKPHOS 68  BILITOT 0.6  PROT 7.4  ALBUMIN 3.7   No results for input(s): LIPASE, AMYLASE in the last 168 hours. No results for input(s): AMMONIA in the last 168 hours. CBC:  Recent Labs Lab 12/10/15 2201  WBC 6.0  NEUTROABS 4.9  HGB 10.6*  HCT 34.9*  MCV 90.4  PLT 214   Cardiac Enzymes: No results for input(s): CKTOTAL, CKMB, CKMBINDEX, TROPONINI in the last 168 hours.  BNP (last 3 results) No results for input(s): PROBNP in the last 8760 hours. CBG: No results for input(s): GLUCAP in the last 168 hours.  Radiological Exams on Admission: Dg Chest 2 View  12/10/2015  CLINICAL DATA:  Fever, abd pain, patient yelling that she hurts all over EXAM: CHEST  2 VIEW COMPARISON:  11/11/2015 FINDINGS: Lateral view moderately degraded by patient body habitus and positioning. Midline trachea. Cardiomegaly accentuated by AP portable technique. No pleural effusion or pneumothorax. Similar appearance of patchy left base opacity. No congestive failure. IMPRESSION: Similar patchy left base opacity, favoring atelectasis. Cardiomegaly without congestive failure. Electronically Signed   By: Jeronimo GreavesKyle  Talbot M.D.   On: 12/10/2015 23:04   Ct Abdomen Pelvis W Contrast  12/11/2015  CLINICAL DATA:  Vomiting. Generalized abdominal pain. Patient was seen by MD earlier today. Patient is refusing to take prescribed medications. EXAM: CT ABDOMEN AND PELVIS WITH CONTRAST TECHNIQUE: Multidetector CT imaging of the abdomen and pelvis was performed using the standard protocol following bolus administration of intravenous contrast. CONTRAST:  100mL OMNIPAQUE IOHEXOL 300 MG/ML  SOLN COMPARISON:  11/07/2015 FINDINGS: Linear atelectasis in the right lung base. There is laxity of the anterior abdominal wall with portions of the anterior and right lateral abdomen not included within the field of view of this study. The spleen is mildly enlarged but  otherwise  homogeneous. The liver, gallbladder, pancreas, adrenal glands, kidneys, abdominal aorta, inferior vena cava, and retroperitoneal lymph nodes are unremarkable. Visualized stomach, small bowel, and colon are not abnormally distended. No free air or free fluid demonstrated in the visualized abdomen. Pelvis: Uterus and ovaries are not enlarged. Nodular appearance to the uterus is consistent with uterine fibroids. Suprapubic catheter deflects the bladder. No free or loculated pelvic fluid collections. No pelvic mass or lymphadenopathy. The rectosigmoid colon is decompressed but there appears to be thickening of the wall of the rectosigmoid colon which may indicate proctocolitis. This appearance is similar to previous study. IMPRESSION: No acute process demonstrated in the abdomen or pelvis. No evidence of bowel obstruction. Nonspecific thickening of the wall of the rectosigmoid colon may represent proctocolitis but is unchanged since prior study. Uterine fibroids. Suprapubic catheter. Mild splenic enlargement. Electronically Signed   By: Burman Nieves M.D.   On: 12/11/2015 00:15    EKG: Independently reviewed.  Assessment/Plan Principal Problem:   UTI (lower urinary tract infection) Active Problems:   Chronic pain syndrome   Essential hypertension, benign   Diabetes mellitus type 2, controlled (HCC)   Hypokalemia   1. UTI - 1. Elita Quick per pharm consult 2. Cultures pending 3. See HPI regarding h/o pseudomonas and MDRO causing UTI just last month 2. Chronic pain syndrome - continue home meds, avoid escalation of narcotics 3. HTN - continue home meds, hold HCTZ 4. DM2 - continue home 70/30 and SSI 5. Hypokalemia - replacing  PT/OT and social work consults placed.  Code Status: DNR  Family Communication: No family in room Disposition Plan: Admit to inpatient   Time spent: 70 min  Talin Rozeboom M. [facility] Pager [phone]  If 7AM-7PM, please contact the day team taking care of  the patient [email or web address] Password [email or web address] 12/11/2015, 2:13 AM

## 2015-12-11 NOTE — ED Notes (Signed)
Call from 1500 were pt reported she lost her dentures but Mrs [name] was observed in room 4 of the ED for several hours by this writer and at no time were dentures ever seen or mentioned by Mrs [name].

## 2015-12-11 NOTE — ED Notes (Signed)
Patient transported to  Room 1511

## 2015-12-11 NOTE — Progress Notes (Signed)
OT Cancellation Note  Patient Details Name: Karen BarrioChristy Walls MRN: [id number] DOB: 09/18/1961   Cancelled Treatment:    Reason Eval/Treat Not Completed: Pain limiting ability to participate   Will check on pt next day  [name], [name] 12/11/2015, 12:24 PM

## 2015-12-11 NOTE — ED Provider Notes (Signed)
CSN: [id number]     Arrival date & time 12/10/15  2143 History   First MD Initiated Contact with Patient 12/10/15 2155     Chief Complaint  Patient presents with  . Emesis     (Consider location/radiation/quality/duration/timing/severity/associated sxs/prior Treatment) Patient is a 54 y.o. female presenting with vomiting.  Emesis Severity:  Severe Duration:  1 day Timing:  Constant Quality:  Stomach contents Progression:  Worsening Chronicity:  New Relieved by:  Nothing Associated symptoms: abdominal pain (diffuse), cough, fever, headaches, sore throat and URI   Associated symptoms: no diarrhea   Risk factors: diabetes     Past Medical History  Diagnosis Date  . Diabetes mellitus without complication (HCC)   . Chronic pain   . Morbid obesity (HCC)   . OSA (obstructive sleep apnea)   . Generalized weakness   . Fibromyalgia   . Lyme disease   . Anemia   . History of blood transfusion     pt states she has had 5 blood transfusions  . Sleep disorder   . UTI (lower urinary tract infection) 02/22/2014  . Hypertension   . Hypothyroidism   . COPD (chronic obstructive pulmonary disease) (HCC)   . Shortness of breath   . GERD (gastroesophageal reflux disease)    Past Surgical History  Procedure Laterality Date  . Explaratory      on stomach,   . Appendectomy    . Foot surgery      due to stepping on broken glass  . Spinal tap    . Supra pubic catheter    . Esophagogastroduodenoscopy (egd) with propofol N/A 03/05/2014    Procedure: ESOPHAGOGASTRODUODENOSCOPY (EGD) WITH PROPOFOL;  Surgeon: Willis Modena, MD;  Location: [facility];  Service: Endoscopy;  Laterality: N/A;  . Colonoscopy with propofol N/A 03/05/2014    Procedure: COLONOSCOPY WITH PROPOFOL;  Surgeon: Willis Modena, MD;  Location: [facility];  Service: Endoscopy;  Laterality: N/A;  . Dental restoration/extraction with x-ray    . Colonoscopy with propofol N/A 03/06/2014    Procedure: COLONOSCOPY WITH  PROPOFOL;  Surgeon: Willis Modena, MD;  Location: [facility];  Service: Endoscopy;  Laterality: N/A;   Family History  Problem Relation Age of Onset  . Diabetes Mellitus II Mother    Social History  Substance Use Topics  . Smoking status: Former Smoker    Quit date: 06/22/2008  . Smokeless tobacco: None  . Alcohol Use: No   OB History    No data available     Review of Systems  Constitutional: Positive for fever and fatigue.  HENT: Positive for sore throat.   Eyes: Negative for visual disturbance.  Respiratory: Positive for cough. Negative for shortness of breath.   Cardiovascular: Negative for chest pain.  Gastrointestinal: Positive for nausea, vomiting and abdominal pain (diffuse). Negative for diarrhea.  Genitourinary: Negative for difficulty urinating.  Musculoskeletal: Positive for back pain. Negative for neck pain.  Skin: Negative for rash.  Neurological: Positive for headaches. Negative for syncope.      Allergies  Ibuprofen; Sulfa antibiotics; Levofloxacin; Lisinopril; and Penicillins  Home Medications   Prior to Admission medications   Medication Sig Start Date End Date Taking? Authorizing Provider  acetaminophen (TYLENOL) 325 MG tablet Take 650 mg by mouth every 4 (four) hours as needed for mild pain.    Historical Provider, MD  benzocaine (ORAJEL) 10 % mucosal gel Use as directed 1 application in the mouth or throat 3 (three) times daily as needed for mouth pain.  Historical Provider, MD  bisacodyl (DULCOLAX) 5 MG EC tablet Take 2 tablets (10 mg total) by mouth daily. Patient taking differently: Take 5 mg by mouth every other day.  03/08/14   Leroy Sea, MD  buPROPion (WELLBUTRIN XL) 300 MG 24 hr tablet Take 300 mg by mouth daily.    Historical Provider, MD  cloNIDine (CATAPRES) 0.1 MG tablet Take 0.1 mg by mouth as needed.    Historical Provider, MD  diazepam (VALIUM) 5 MG tablet Take 1-2 tablets (5-10 mg total) by mouth every 12 (twelve) hours as  needed for anxiety (TAKES  IN AM AND  IN PM, ONLY AS NEEDED FOR ANXIETY). 11/12/15   Dorothea Ogle, MD  docusate sodium 100 MG CAPS Take 200 mg by mouth 2 (two) times daily. 03/08/14   Leroy Sea, MD  gabapentin (NEURONTIN) 300 MG capsule Take 300 mg by mouth 2 (two) times daily.    Historical Provider, MD  guaifenesin (ROBITUSSIN) 100 MG/5ML syrup Take 200 mg by mouth every 4 (four) hours as needed for cough.    Historical Provider, MD  hydrochlorothiazide (HYDRODIURIL) 25 MG tablet Take 25 mg by mouth daily.    Historical Provider, MD  insulin lispro (HUMALOG) 100 UNIT/ML injection Inject 0-8 Units into the skin See admin instructions. Sliding Scale - Inject before each meal and at bed time    Historical Provider, MD  insulin NPH-regular Human (NOVOLIN 70/30) (70-30) 100 UNIT/ML injection Inject 25 Units into the skin 2 (two) times daily with a meal. 03/11/14   Leroy Sea, MD  levothyroxine (SYNTHROID, LEVOTHROID) 125 MCG tablet Take 250 mcg by mouth daily before breakfast.    Historical Provider, MD  lubiprostone (AMITIZA) 24 MCG capsule Take 24 mcg by mouth 2 (two) times daily with a meal.    Historical Provider, MD  methadone (DOLOPHINE) 10 MG tablet Take three tablets by mouth three times daily for pain 12/08/15   Tiffany L Reed, DO  nitroGLYCERIN (NITROSTAT) 0.4 MG SL tablet Place 0.4 mg under the tongue every 5 (five) minutes as needed for chest pain.    Historical Provider, MD  omeprazole (PRILOSEC) 20 MG capsule Take 20 mg by mouth daily.    Historical Provider, MD  oxybutynin (DITROPAN) 5 MG tablet Take 5 mg by mouth 2 (two) times daily.     Historical Provider, MD  Oxycodone HCl 10 MG TABS Take one tablet by mouth every 4 hours as needed for pain 11/11/15   Kathlen Mody, MD  PARoxetine (PAXIL-CR) 25 MG 24 hr tablet Take 1 tablet (25 mg total) by mouth daily. 10/31/15   Costin Otelia Sergeant, MD  phenazopyridine (PYRIDIUM) 200 MG tablet Take 200 mg by mouth 3 (three) times daily  as needed for pain.    Historical Provider, MD  polyethylene glycol (MIRALAX / GLYCOLAX) packet Take 17 g by mouth 2 (two) times daily. 03/08/14   Leroy Sea, MD  Polyvinyl Alcohol-Povidone (FRESHKOTE) 2.7-2 % SOLN Place 1 drop into both eyes 2 (two) times daily.    Historical Provider, MD  potassium chloride (MICRO-K) 10 MEQ CR capsule Take 10 mEq by mouth daily.    Historical Provider, MD  sucralfate (CARAFATE) 1 G tablet Take 1 g by mouth 4 (four) times daily -  with meals and at bedtime.    Historical Provider, MD  traZODone (DESYREL) 100 MG tablet Take 100 mg by mouth at bedtime.    Historical Provider, MD  Vitamin D, Ergocalciferol, (DRISDOL) 50000 UNITS  CAPS capsule Take 50,000 Units by mouth every 14 (fourteen) days.    Historical Provider, MD   BP 110/62 mmHg  Pulse 61  Temp(Src) 102.6 F (39.2 C) (Oral)  Resp 13  Wt 303 lb (137.44 kg)  SpO2 100% Physical Exam  Constitutional: She is oriented to person, place, and time. She appears well-developed and well-nourished. She appears distressed (pain).  HENT:  Head: Normocephalic and atraumatic.  Eyes: Conjunctivae and EOM are normal.  Neck: Normal range of motion.  Cardiovascular: Normal rate, regular rhythm, normal heart sounds and intact distal pulses.  Exam reveals no gallop and no friction rub.   No murmur heard. Pulmonary/Chest: Effort normal and breath sounds normal. No respiratory distress. She has no wheezes. She has no rales.  Abdominal: Soft. She exhibits no distension. There is tenderness. There is guarding (diffuse).  Genitourinary: Rectal exam shows no mass, no tenderness and anal tone normal.  Musculoskeletal: She exhibits no edema or tenderness.  Neurological: She is alert and oriented to person, place, and time.  Skin: Skin is warm and dry. No rash noted. She is not diaphoretic. No erythema.  Blanching erythema to sacrum  Nursing note and vitals reviewed.   ED Course  Procedures (including critical care  time) Labs Review Labs Reviewed  COMPREHENSIVE METABOLIC PANEL - Abnormal; Notable for the following:    Potassium 3.2 (*)    Chloride 91 (*)    CO2 37 (*)    Glucose, Bld 159 (*)    All other components within normal limits  CBC WITH DIFFERENTIAL/PLATELET - Abnormal; Notable for the following:    RBC 3.86 (*)    Hemoglobin 10.6 (*)    HCT 34.9 (*)    RDW 15.8 (*)    Lymphs Abs 0.4 (*)    All other components within normal limits  URINALYSIS, ROUTINE W REFLEX MICROSCOPIC (NOT AT [facility]) - Abnormal; Notable for the following:    Color, Urine AMBER (*)    APPearance TURBID (*)    pH 8.5 (*)    Hgb urine dipstick LARGE (*)    Bilirubin Urine SMALL (*)    Leukocytes, UA LARGE (*)    All other components within normal limits  URINE MICROSCOPIC-ADD ON - Abnormal; Notable for the following:    Squamous Epithelial / LPF 0-5 (*)    Bacteria, UA MANY (*)    All other components within normal limits  I-STAT CG4 LACTIC ACID, ED - Abnormal; Notable for the following:    Lactic Acid, Venous 2.24 (*)    All other components within normal limits  CULTURE, BLOOD (ROUTINE X 2)  CULTURE, BLOOD (ROUTINE X 2)  URINE CULTURE  INFLUENZA PANEL BY PCR (TYPE A & B, H1N1)  I-STAT CG4 LACTIC ACID, ED    Imaging Review Dg Chest 2 View  12/10/2015  CLINICAL DATA:  Fever, abd pain, patient yelling that she hurts all over EXAM: CHEST  2 VIEW COMPARISON:  11/11/2015 FINDINGS: Lateral view moderately degraded by patient body habitus and positioning. Midline trachea. Cardiomegaly accentuated by AP portable technique. No pleural effusion or pneumothorax. Similar appearance of patchy left base opacity. No congestive failure. IMPRESSION: Similar patchy left base opacity, favoring atelectasis. Cardiomegaly without congestive failure. Electronically Signed   By: Jeronimo Greaves M.D.   On: 12/10/2015 23:04   Ct Abdomen Pelvis W Contrast  12/11/2015  CLINICAL DATA:  Vomiting. Generalized abdominal pain. Patient was  seen by MD earlier today. Patient is refusing to take prescribed medications. EXAM: CT  ABDOMEN AND PELVIS WITH CONTRAST TECHNIQUE: Multidetector CT imaging of the abdomen and pelvis was performed using the standard protocol following bolus administration of intravenous contrast. CONTRAST:  100mL OMNIPAQUE IOHEXOL 300 MG/ML  SOLN COMPARISON:  11/07/2015 FINDINGS: Linear atelectasis in the right lung base. There is laxity of the anterior abdominal wall with portions of the anterior and right lateral abdomen not included within the field of view of this study. The spleen is mildly enlarged but otherwise homogeneous. The liver, gallbladder, pancreas, adrenal glands, kidneys, abdominal aorta, inferior vena cava, and retroperitoneal lymph nodes are unremarkable. Visualized stomach, small bowel, and colon are not abnormally distended. No free air or free fluid demonstrated in the visualized abdomen. Pelvis: Uterus and ovaries are not enlarged. Nodular appearance to the uterus is consistent with uterine fibroids. Suprapubic catheter deflects the bladder. No free or loculated pelvic fluid collections. No pelvic mass or lymphadenopathy. The rectosigmoid colon is decompressed but there appears to be thickening of the wall of the rectosigmoid colon which may indicate proctocolitis. This appearance is similar to previous study. IMPRESSION: No acute process demonstrated in the abdomen or pelvis. No evidence of bowel obstruction. Nonspecific thickening of the wall of the rectosigmoid colon may represent proctocolitis but is unchanged since prior study. Uterine fibroids. Suprapubic catheter. Mild splenic enlargement. Electronically Signed   By: Burman NievesWilliam  Stevens M.D.   On: 12/11/2015 00:15   I have personally reviewed and evaluated these images and lab results as part of my medical decision-making.   EKG Interpretation None      MDM   Final diagnoses:  Sepsis, due to unspecified organism [facility](HCC)  UTI (lower urinary tract  infection)  Abdominal pain, unspecified abdominal location    [age] old female with a history of diabetes, hypertension, chronic pain, hypothyroidism, COPD, suprapubic catheter in place presents with concern for nausea and vomiting from her skilled nursing facility.  On arrival to the emergency department patient is febrile to 102.6.  Her lactic acid came back elevated, and could sepsis was initiated with patient given vanc and fortaz. Her suprapubic catheter was exchanged for a new catheter with urinalysis taken from new catheter which is concerning for urinary tract infection.  Patient also reported significant abdominal pain and tenderness and a CT abdomen and pelvis was ordered which showed no acute findings. She was given morphine for pain, Zofran for nausea. Chest x-ray showed no sign of pneumonia. Given patient's generalized body aches and concern regarding cough as well as a influenza was ordered. Patient most likely with sepsis secondary to urinary source.  Prior urine cx show bacteria in past sensitive to fortaz. Pt with desaturation with sleeping likely secondary to OSA.  She will be admitted to hospitalist for further care.     Alvira MondayErin Wellington Winegarden, MD 12/11/15 [phone]

## 2015-12-12 LAB — BASIC METABOLIC PANEL
ANION GAP: 8 (ref 5–15)
BUN: 10 mg/dL (ref 6–20)
CO2: 39 mmol/L — ABNORMAL HIGH (ref 22–32)
Calcium: 8.8 mg/dL — ABNORMAL LOW (ref 8.9–10.3)
Chloride: 98 mmol/L — ABNORMAL LOW (ref 101–111)
Creatinine, Ser: 0.82 mg/dL (ref 0.44–1.00)
GLUCOSE: 86 mg/dL (ref 65–99)
POTASSIUM: 2.8 mmol/L — AB (ref 3.5–5.1)
Sodium: 145 mmol/L (ref 135–145)

## 2015-12-12 LAB — GLUCOSE, CAPILLARY
GLUCOSE-CAPILLARY: 83 mg/dL (ref 65–99)
GLUCOSE-CAPILLARY: 96 mg/dL (ref 65–99)
Glucose-Capillary: 105 mg/dL — ABNORMAL HIGH (ref 65–99)
Glucose-Capillary: 101 mg/dL — ABNORMAL HIGH (ref 65–99)

## 2015-12-12 LAB — CBC
HEMATOCRIT: 34.6 % — AB (ref 36.0–46.0)
Hemoglobin: 10.4 g/dL — ABNORMAL LOW (ref 12.0–15.0)
MCH: 27.7 pg (ref 26.0–34.0)
MCHC: 30.1 g/dL (ref 30.0–36.0)
MCV: 92.3 fL (ref 78.0–100.0)
PLATELETS: 202 10*3/uL (ref 150–400)
RBC: 3.75 MIL/uL — AB (ref 3.87–5.11)
RDW: 16.5 % — ABNORMAL HIGH (ref 11.5–15.5)
WBC: 4.8 10*3/uL (ref 4.0–10.5)

## 2015-12-12 LAB — MAGNESIUM: MAGNESIUM: 2.2 mg/dL (ref 1.7–2.4)

## 2015-12-12 LAB — LACTIC ACID, PLASMA: LACTIC ACID, VENOUS: 1 mmol/L (ref 0.5–2.0)

## 2015-12-12 MED ORDER — POTASSIUM CHLORIDE CRYS ER 20 MEQ PO TBCR
40.0000 meq | EXTENDED_RELEASE_TABLET | ORAL | Status: AC
  Administered 2015-12-12 (×4): 40 meq via ORAL
  Filled 2015-12-12 (×4): qty 2

## 2015-12-12 NOTE — Evaluation (Signed)
Physical Therapy Evaluation Patient Details Name: [name] MRN: [id number] DOB: 1961/08/02 Today's Date: 12/12/2015   History of Present Illness  54 yo female admitted with UTI. Hx of neuropathy, Lyme disease, morbid obesity, chronic pain, generalized weakness, fibromyalgia, anemia, COPD.   Clinical Impression  On eval, pt required Min-Mod assist for mobility-sat EOB for ~8 minutes with Min guard assist. Pt c/o dizziness initially. No LOB. Performed LAQs x 10 both LEs. Assisted back to bed. Recommend return to SNF.     Follow Up Recommendations SNF    Equipment Recommendations  None recommended by PT    Recommendations for Other Services       Precautions / Restrictions Precautions Precautions: Fall Restrictions Weight Bearing Restrictions: No      Mobility  Bed Mobility Overal bed mobility: Needs Assistance Bed Mobility: Supine to Sit;Sit to Supine     Supine to sit: Min assist;HOB elevated Sit to supine: Mod assist;HOB elevated   General bed mobility comments: Assist for trunk and bil LEs. Increased time. Moderate reliance on bedrail.   Transfers Overall transfer level: Needs assistance   Transfers: Lateral/Scoot Transfers          Lateral/Scoot Transfers: Mod assist General transfer comment: 2-3 small scoots toward HOB. Utilized bedpad to aid with scooting. Pt unable to perform without assistance.  Ambulation/Gait                Stairs            Wheelchair Mobility    Modified Rankin (Stroke Patients Only)       Balance                                             Pertinent Vitals/Pain Pain Assessment: 0-10 Pain Score: 7  Pain Location: "all over" but especially R side abdomen Pain Intervention(s): Monitored during session;RN gave pain meds during session;Repositioned    Home Living Family/patient expects to be discharged to:: Skilled nursing facility                 Additional Comments: Pt is  from SNF     Prior Function Level of Independence: Needs assistance   Gait / Transfers Assistance Needed: Pt reports assist with stand pivot transfer to [facility] or lift chair/power chair  using RW for support  ADL's / Homemaking Assistance Needed: PTA pt reports she received assist for bathing, cleaning        Hand Dominance        Extremity/Trunk Assessment   Upper Extremity Assessment: Generalized weakness           Lower Extremity Assessment: RLE deficits/detail;LLE deficits/detail RLE Deficits / Details: knee ext ~3/5. Pt did not put forth max effort LLE Deficits / Details: knee ext ~3/5. Pt did not put forth max effort  Cervical / Trunk Assessment: Normal  Communication   Communication: No difficulties  Cognition Arousal/Alertness: Awake/alert Behavior During Therapy: Anxious Overall Cognitive Status: Within Functional Limits for tasks assessed                      General Comments      Exercises General Exercises - Lower Extremity Long Arc Quad: AROM;Both;10 reps;Seated      Assessment/Plan    PT Assessment Patient needs continued PT services  PT Diagnosis Generalized weakness;Acute pain   PT Problem List Decreased strength;Decreased range  of motion;Decreased activity tolerance;Decreased balance;Decreased mobility;Obesity;Pain  PT Treatment Interventions Functional mobility training;Therapeutic activities;Patient/family education;Balance training;Therapeutic exercise;DME instruction   PT Goals (Current goals can be found in the Care Plan section) Acute Rehab PT Goals Patient Stated Goal: to get back into ALF PT Goal Formulation: With patient Time For Goal Achievement: 12/26/15 Potential to Achieve Goals: Fair    Frequency Min 2X/week   Barriers to discharge        Co-evaluation               End of Session   Activity Tolerance: Patient limited by fatigue;Patient limited by pain Patient left: in bed;with call bell/phone within  reach;with bed alarm set;with nursing/sitter in room           Time: 1502-1529 PT Time Calculation (min) (ACUTE ONLY): 27 min   Charges:   PT Evaluation $Initial PT Evaluation Tier I: 1 Procedure     PT G Codes:        [name], MPT Pager: [phone]

## 2015-12-12 NOTE — Progress Notes (Signed)
CSW received referral, however was unable to complete assessment at this time.   Weekend CSW will complete assessment of Pt.   Leron Croakassandra [facility]  [facility]  [phone]

## 2015-12-12 NOTE — Progress Notes (Signed)
PHARMACY NOTE -  [name]  Pharmacy has been assisting with dosing of Fortaz  for UTI. Dosage remains stable at 2gm IV q8h and need for further dosage adjustment appears unlikely at present.  Spoke with Micro lab- urine cx was never sent and urine has been discarded.  Sent text to Dr Benjamine MolaVann to make her aware.   Recommend continue Fortaz while inpatient.   Could consider fosfomycin 3gm q3days to complete 14 total course of antibiotics   Will sign off at this time.  Please reconsult if a change in clinical status warrants re-evaluation of dosage.  Karen PushMichelle Maurie Walls, PharmD, BCPS Pager: [phone] 12/12/2015@3 :05 PM

## 2015-12-12 NOTE — Progress Notes (Signed)
PROGRESS NOTE  Theotis BarrioChristy Rathje ZOX:[id number] DOB: 11/21/1961 DOA: 12/10/2015 PCP: Terald SleeperOBSON,MICHAEL GAVIN, MD  Assessment/Plan: UTI Elita Quick- Fortaz per pharm consult Cultures pending  h/o pseudomonas and MDRO causing UTI just last month -has supapubic foley catheter  Chronic pain syndrome - continue home meds, avoid escalation of narcotics  HTN - continue home meds, hold HCTZ  DM2 - continue home 70/30 and SSI -requesting regular diet  Hypokalemia - replacing -check Mg ok  Depression -continue home meds -also believe patient has personality disorder-- she has said in the past that she will do "anything" to stay in the hospital  OSA- prob a component of obesity hypoventilation as well (confirmed in record from Dr. Anola GurneyWalls- [facility] in [place]) -records from [place] show CPAP back in 2009 (BiPAP at 15/10) per PCP note but it appears patient has refused repeat, someone ordered CPAP while in hospital, not sure this can be arranged in SNF -patient did have desaturations in the ER while sleeping   Morbid obesity  Has been refusing PT -await culture for return to SNF -patient agreeable to palliative care consult as she describes herself as having multiple "terminal" illnesses  Records from [facility] reviewed back to 2013   Code Status: DNR Family Communication: patient Disposition Plan: SNF once culture back   Consultants:    Procedures:     HPI/Subjective: Pan + ROS Asking for increase in pain meds  Objective: Filed Vitals:   12/11/15 2213 12/12/15 0635  BP: 131/55 118/78  Pulse: 61 51  Temp: 99 F (37.2 C) 98.5 F (36.9 C)  Resp: 16 16    Intake/Output Summary (Last 24 hours) at 12/12/15 1113 Last data filed at 12/12/15 1002  Gross per 24 hour  Intake    340 ml  Output   1675 ml  Net  -1335 ml   Filed Weights   12/10/15 2217 12/11/15 0331  Weight: 137.44 kg (303 lb) 131.9 kg (290 lb 12.6 oz)    Exam:   General:  Awake, multiple complaints- does not  look to be in any distress  Cardiovascular: rrr  Respiratory: diminished, on cpap  Abdomen: +BS, obese  Musculoskeletal: min edema   Data Reviewed: Basic Metabolic Panel:  Recent Labs Lab 12/10/15 2201 12/12/15 0538  NA 139 145  K 3.2* 2.8*  CL 91* 98*  CO2 37* 39*  GLUCOSE 159* 86  BUN 12 10  CREATININE 0.89 0.82  CALCIUM 9.1 8.8*  MG  --  2.2   Liver Function Tests:  Recent Labs Lab 12/10/15 2201  AST 24  ALT 22  ALKPHOS 68  BILITOT 0.6  PROT 7.4  ALBUMIN 3.7   No results for input(s): LIPASE, AMYLASE in the last 168 hours. No results for input(s): AMMONIA in the last 168 hours. CBC:  Recent Labs Lab 12/10/15 2201 12/12/15 0538  WBC 6.0 4.8  NEUTROABS 4.9  --   HGB 10.6* 10.4*  HCT 34.9* 34.6*  MCV 90.4 92.3  PLT 214 202   Cardiac Enzymes: No results for input(s): CKTOTAL, CKMB, CKMBINDEX, TROPONINI in the last 168 hours. BNP (last 3 results) No results for input(s): BNP in the last 8760 hours.  ProBNP (last 3 results) No results for input(s): PROBNP in the last 8760 hours.  CBG:  Recent Labs Lab 12/11/15 0726 12/11/15 1208 12/11/15 1659 12/11/15 2210 12/12/15 0747  GLUCAP 117* 94 147* 126* 83    Recent Results (from the past 240 hour(s))  MRSA PCR Screening     Status: Abnormal  Collection Time: 12/11/15 12:58 PM  Result Value Ref Range Status   MRSA by PCR POSITIVE (A) NEGATIVE Final    Comment:        The GeneXpert MRSA Assay (FDA approved for NASAL specimens only), is one component of a comprehensive MRSA colonization surveillance program. It is not intended to diagnose MRSA infection nor to guide or monitor treatment for MRSA infections. RESULT CALLED TO, READ BACK BY AND VERIFIED WITH: Guido Sander [id number] @ 1450 BY J SCOTTON      Studies: Dg Chest 2 View  12/10/2015  CLINICAL DATA:  Fever, abd pain, patient yelling that she hurts all over EXAM: CHEST  2 VIEW COMPARISON:  11/11/2015 FINDINGS: Lateral view  moderately degraded by patient body habitus and positioning. Midline trachea. Cardiomegaly accentuated by AP portable technique. No pleural effusion or pneumothorax. Similar appearance of patchy left base opacity. No congestive failure. IMPRESSION: Similar patchy left base opacity, favoring atelectasis. Cardiomegaly without congestive failure. Electronically Signed   By: Jeronimo Greaves M.D.   On: 12/10/2015 23:04   Ct Abdomen Pelvis W Contrast  12/11/2015  CLINICAL DATA:  Vomiting. Generalized abdominal pain. Patient was seen by MD earlier today. Patient is refusing to take prescribed medications. EXAM: CT ABDOMEN AND PELVIS WITH CONTRAST TECHNIQUE: Multidetector CT imaging of the abdomen and pelvis was performed using the standard protocol following bolus administration of intravenous contrast. CONTRAST:  OMNIPAQUE IOHEXOL 300 MG/ML  SOLN COMPARISON:  11/07/2015 FINDINGS: Linear atelectasis in the right lung base. There is laxity of the anterior abdominal wall with portions of the anterior and right lateral abdomen not included within the field of view of this study. The spleen is mildly enlarged but otherwise homogeneous. The liver, gallbladder, pancreas, adrenal glands, kidneys, abdominal aorta, inferior vena cava, and retroperitoneal lymph nodes are unremarkable. Visualized stomach, small bowel, and colon are not abnormally distended. No free air or free fluid demonstrated in the visualized abdomen. Pelvis: Uterus and ovaries are not enlarged. Nodular appearance to the uterus is consistent with uterine fibroids. Suprapubic catheter deflects the bladder. No free or loculated pelvic fluid collections. No pelvic mass or lymphadenopathy. The rectosigmoid colon is decompressed but there appears to be thickening of the wall of the rectosigmoid colon which may indicate proctocolitis. This appearance is similar to previous study. IMPRESSION: No acute process demonstrated in the abdomen or pelvis. No evidence of  bowel obstruction. Nonspecific thickening of the wall of the rectosigmoid colon may represent proctocolitis but is unchanged since prior study. Uterine fibroids. Suprapubic catheter. Mild splenic enlargement. Electronically Signed   By: Burman Nieves M.D.   On: 12/11/2015 00:15    Scheduled Meds: . bisacodyl  5 mg Oral QODAY  . buPROPion  300 mg Oral Daily  . cefTAZidime (FORTAZ)  IV  2 g Intravenous 3 times per day  . docusate sodium  200 mg Oral BID  . gabapentin  300 mg Oral BID  . heparin  5,000 Units Subcutaneous 3 times per day  . insulin aspart  0-5 Units Subcutaneous QHS  . insulin aspart  0-9 Units Subcutaneous TID WC  . insulin aspart protamine- aspart  25 Units Subcutaneous BID WC  . levothyroxine  250 mcg Oral QAC breakfast  . lubiprostone  24 mcg Oral BID WC  . methadone  30 mg Oral 3 times per day  . oxybutynin  5 mg Oral BID  . pantoprazole  40 mg Oral Daily  . PARoxetine  25 mg Oral Daily  . phenazopyridine  100 mg Oral TID WC  . polyethylene glycol  17 g Oral BID  . polyvinyl alcohol  1 drop Both Eyes BID  . potassium chloride  40 mEq Oral Q2H  . sucralfate  1 g Oral TID WC & HS  . traZODone  100 mg Oral QHS   Continuous Infusions:  Antibiotics Given (last 72 hours)    Date/Time Action Medication Dose Rate   12/11/15 0609 Given   cefTAZidime (FORTAZ) 2 g in dextrose 5 % 50 mL IVPB 2 g 100 mL/hr   12/11/15 1343 Given   cefTAZidime (FORTAZ) 2 g in dextrose 5 % 50 mL IVPB 2 g 100 mL/hr   12/11/15 2221 Given   cefTAZidime (FORTAZ) 2 g in dextrose 5 % 50 mL IVPB 2 g 100 mL/hr   12/12/15 1610 Given   cefTAZidime (FORTAZ) 2 g in dextrose 5 % 50 mL IVPB 2 g 100 mL/hr      Principal Problem:   UTI (lower urinary tract infection) Active Problems:   Chronic pain syndrome   Morbid obesity (HCC)   Essential hypertension, benign   Diabetes mellitus type 2, controlled (HCC)   Hypokalemia    Time spent: 45 min (reviewing old records from [place])    Lakai Moree  [facility]  [facility] Pager [phone]. If 7PM-7AM, please contact night-coverage at [email or web address], password [facility] 12/12/2015, 11:13 AM  LOS: 1 day

## 2015-12-12 NOTE — Progress Notes (Signed)
OT Cancellation Note  Patient Details Name: Karen BarrioChristy Walls MRN: [id number] DOB: 11/03/1961   Cancelled Treatment:    Reason Eval/Treat Not Completed: OT screened, no needs identified, will sign off - Pt is long term resident of SNF with plan to return to SNF.  No acute needs identified.  Any OT needs can be addressed at SNF.   [name], [name]  [name], [name]   12/12/2015, 1:23 PM

## 2015-12-12 NOTE — NC FL2 (Signed)
[place] MEDICAID FL2 LEVEL OF CARE SCREENING TOOL     IDENTIFICATION  Patient Name: Karen BarrioChristy Walls Birthdate: 07/01/1961 Sex: female Admission Date (Current Location): 12/10/2015  [facility] [place]     Facility and Address:         Provider Number:    Attending Physician Name and Address:  Joseph ArtJessica U Vann, DO  Relative Name and Phone Number:       Current Level of Care:   Recommended Level of Care:   Prior Approval Number:    Date Approved/Denied:   PASRR Number:    Discharge Plan:      Current Diagnoses: Patient Active Problem List   Diagnosis Date Noted  . Hypokalemia 12/11/2015  . HCAP (healthcare-associated pneumonia) 11/07/2015  . Sepsis (HCC) 11/07/2015  . Abdominal pain 10/28/2015  . Diabetes mellitus type 2, controlled (HCC) 10/28/2015  . MDD (major depressive disorder), recurrent episode, severe (HCC) 10/28/2015  . UTI (lower urinary tract infection) 10/27/2015  . Essential hypertension, benign 09/01/2014  . Scalp lesion 02/23/2014  . 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 Inadequate material resources 02/23/2014  . DNR (do not resuscitate) 02/23/2014  . Urinary tract infection 02/22/2014  . COPD (chronic obstructive pulmonary disease) (HCC) 02/18/2014  . Anemia, iron deficiency 02/18/2014  . Hereditary and idiopathic peripheral neuropathy 10/19/2013  . Chronic pain syndrome 10/19/2013  . Morbid obesity (HCC) 10/19/2013  . Type II or unspecified type diabetes mellitus with peripheral circulatory disorders, uncontrolled(250.72) 10/19/2013  . Depression 10/19/2013  . Hypothyroidism 10/19/2013  . Unspecified constipation 10/19/2013    Orientation RESPIRATION BLADDER Height & Weight       O2 (4L [place])        BEHAVIORAL SYMPTOMS/MOOD NEUROLOGICAL BOWEL NUTRITION STATUS        Diet (Carb MOD)  AMBULATORY STATUS COMMUNICATION OF NEEDS Skin                               Personal Care Assistance Level of Assistance              Functional Limitations Info             SPECIAL CARE FACTORS FREQUENCY                       Contractures Contractures Info: Not present    Additional Factors Info  Code Status, Isolation Precautions Code Status Info: DNR       Isolation Precautions Info: MRSA     Current Medications (12/12/2015):  This is the current hospital active medication list Current Facility-Administered Medications  Medication Dose Route Frequency Provider Last Rate Last Dose  . acetaminophen (TYLENOL) tablet 650 mg  650 mg Oral Q4H PRN Hillary BowJared M Gardner, DO      . benzocaine (ORAJEL) 10 % mucosal gel 1 application  1 application Mouth/Throat TID PRN Hillary BowJared M Gardner, DO      . bisacodyl (DULCOLAX) EC tablet 5 mg  5 mg Oral QODAY Hillary BowJared M Gardner, DO   5 mg at 12/11/15 1010  . buPROPion (WELLBUTRIN XL) 24 hr tablet 300 mg  300 mg Oral Daily Hillary BowJared M Gardner, DO   300 mg at 12/12/15 1055  . cefTAZidime (FORTAZ) 2 g in dextrose 5 % 50 mL IVPB  2 g Intravenous 3 times per day Lorenza EvangelistElizabeth R Green, RPH   2 g at 12/12/15 1513  . diazepam (VALIUM) tablet 10 mg  10  mg Oral QHS PRN Hillary Bow, DO   10 mg at 12/11/15 2220  . diazepam (VALIUM) tablet 5 mg  5 mg Oral Daily PRN Hillary Bow, DO   5 mg at 12/11/15 1730  . docusate sodium (COLACE) capsule 200 mg  200 mg Oral BID Hillary Bow, DO   200 mg at 12/12/15 1055  . gabapentin (NEURONTIN) capsule 300 mg  300 mg Oral BID Hillary Bow, DO   300 mg at 12/12/15 1055  . guaiFENesin (ROBITUSSIN) 100 MG/5ML solution 200 mg  200 mg Oral Q4H PRN Hillary Bow, DO      . heparin injection 5,000 Units  5,000 Units Subcutaneous 3 times per day Hillary Bow, DO   5,000 Units at 12/12/15 1513  . insulin aspart (novoLOG) injection 0-5 Units  0-5 Units Subcutaneous QHS Hillary Bow, DO   0 Units at 12/11/15 2225  . insulin aspart (novoLOG) injection 0-9 Units  0-9 Units Subcutaneous TID WC Hillary Bow, DO   1 Units at 12/11/15 1713  . insulin  aspart protamine- aspart (NOVOLOG MIX 70/30) injection 25 Units  25 Units Subcutaneous BID WC Hillary Bow, DO   25 Units at 12/12/15 1057  . levothyroxine (SYNTHROID, LEVOTHROID) tablet 250 mcg  250 mcg Oral QAC breakfast Hillary Bow, DO   250 mcg at 12/12/15 1056  . lubiprostone (AMITIZA) capsule 24 mcg  24 mcg Oral BID WC Hillary Bow, DO   24 mcg at 12/12/15 1055  . methadone (DOLOPHINE) tablet 30 mg  30 mg Oral 3 times per day Hillary Bow, DO   30 mg at 12/12/15 1513  . nitroGLYCERIN (NITROSTAT) SL tablet 0.4 mg  0.4 mg Sublingual Q5 min PRN Hillary Bow, DO      . oxybutynin (DITROPAN) tablet 5 mg  5 mg Oral BID Hillary Bow, DO   5 mg at 12/12/15 1055  . oxyCODONE (Oxy IR/ROXICODONE) immediate release tablet 10 mg  10 mg Oral Q4H PRN Hillary Bow, DO   10 mg at 12/12/15 1314  . pantoprazole (PROTONIX) EC tablet 40 mg  40 mg Oral Daily Hillary Bow, DO   40 mg at 12/12/15 1055  . PARoxetine (PAXIL-CR) 24 hr tablet 25 mg  25 mg Oral Daily Hillary Bow, DO   25 mg at 12/12/15 1055  . phenazopyridine (PYRIDIUM) tablet 100 mg  100 mg Oral TID WC Jessica U Vann, DO   100 mg at 12/12/15 1314  . polyethylene glycol (MIRALAX / GLYCOLAX) packet 17 g  17 g Oral BID Hillary Bow, DO   17 g at 12/12/15 1055  . polyvinyl alcohol (LIQUIFILM TEARS) 1.4 % ophthalmic solution 1 drop  1 drop Both Eyes BID Hillary Bow, DO   1 drop at 12/12/15 1058  . sucralfate (CARAFATE) tablet 1 g  1 g Oral TID WC & HS Hillary Bow, DO   1 g at 12/12/15 1313  . traZODone (DESYREL) tablet 100 mg  100 mg Oral QHS Hillary Bow, DO   100 mg at 12/11/15 2219     Discharge Medications: Please see discharge summary for a list of discharge medications.  Relevant Imaging Results:  Relevant Lab Results:   Additional Information SSN# [id number]  [name]

## 2015-12-13 DIAGNOSIS — Z7189 Other specified counseling: Secondary | ICD-10-CM | POA: Insufficient documentation

## 2015-12-13 DIAGNOSIS — Z515 Encounter for palliative care: Secondary | ICD-10-CM | POA: Insufficient documentation

## 2015-12-13 LAB — CBC
HCT: 31.7 % — ABNORMAL LOW (ref 36.0–46.0)
Hemoglobin: 9.7 g/dL — ABNORMAL LOW (ref 12.0–15.0)
MCH: 27.7 pg (ref 26.0–34.0)
MCHC: 30.6 g/dL (ref 30.0–36.0)
MCV: 90.6 fL (ref 78.0–100.0)
PLATELETS: 178 10*3/uL (ref 150–400)
RBC: 3.5 MIL/uL — AB (ref 3.87–5.11)
RDW: 16.4 % — AB (ref 11.5–15.5)
WBC: 3.6 10*3/uL — ABNORMAL LOW (ref 4.0–10.5)

## 2015-12-13 LAB — BASIC METABOLIC PANEL
Anion gap: 5 (ref 5–15)
BUN: 8 mg/dL (ref 6–20)
CALCIUM: 8.9 mg/dL (ref 8.9–10.3)
CO2: 35 mmol/L — ABNORMAL HIGH (ref 22–32)
CREATININE: 0.79 mg/dL (ref 0.44–1.00)
Chloride: 105 mmol/L (ref 101–111)
GFR calc Af Amer: >60 mL/min (ref >60)
Glucose, Bld: 90 mg/dL (ref 65–99)
POTASSIUM: 3.7 mmol/L (ref 3.5–5.1)
SODIUM: 145 mmol/L (ref 135–145)

## 2015-12-13 LAB — GLUCOSE, CAPILLARY
GLUCOSE-CAPILLARY: 77 mg/dL (ref 65–99)
GLUCOSE-CAPILLARY: 90 mg/dL (ref 65–99)
Glucose-Capillary: 60 mg/dL — ABNORMAL LOW (ref 65–99)
Glucose-Capillary: 170 mg/dL — ABNORMAL HIGH (ref 65–99)

## 2015-12-13 NOTE — Progress Notes (Signed)
Patient stated she would self administer BIPAP when ready.

## 2015-12-13 NOTE — Progress Notes (Signed)
Patient refused trazadone and ditropan, stating she suspects one of these medications that are causing her to have episodes of "feeling very low", states something "is not right"

## 2015-12-13 NOTE — Progress Notes (Signed)
PROGRESS NOTE  [name] WUJ:[id number] DOB: [birth year] DOA: 12/10/2015 PCP: Terald Sleeper, MD  Assessment/Plan: UTI Elita Quick- until d/c'd then q 3 days of fosfomycin for total of 14 days  h/o pseudomonas and MDRO causing UTI just last month -has supapubic foley catheter  Chronic pain syndrome - continue home meds, avoid escalation of narcotics  HTN - continue home meds, hold HCTZ  DM2 - continue home 70/30 and SSI -requesting regular diet  Hypokalemia - replacing -check Mg ok  Depression -continue home meds - she has said in the past that she will do "anything" to stay in the hospital  OSA- prob a component of obesity hypoventilation as well (confirmed in record from Dr. Anola Gurney- [facility] in [place]) -records from [place] show CPAP back in 2009 (BiPAP at 15/10) per PCP note but it appears patient has refused repeat, someone ordered CPAP while in hospital, not sure this can be arranged in SNF -patient did have desaturations in the ER while sleeping   Morbid obesity   -patient wants palliative care consult as she describes herself as having multiple "terminal" illnesses  Records from [facility] reviewed back to 2013   Code Status: DNR Family Communication: patient Disposition Plan: SNF on Monday   Consultants:  Palliative care  Procedures:     HPI/Subjective: Slept better last PM Requesting a regular diet  Objective: Filed Vitals:   12/12/15 2125 12/13/15 0630  BP: 139/64 137/87  Pulse: 59 67  Temp: 99.6 F (37.6 C) 99 F (37.2 C)  Resp: 16 16    Intake/Output Summary (Last 24 hours) at 12/13/15 1018 Last data filed at 12/13/15 9562  Gross per 24 hour  Intake    150 ml  Output   2250 ml  Net  -2100 ml   Filed Weights   12/10/15 2217 12/11/15 0331  Weight: 137.44 kg (303 lb) 131.9 kg (290 lb 12.6 oz)    Exam:   General:  Awake, multiple complaints- does not look to be in any distress  Cardiovascular: rrr  Respiratory:  diminished  Abdomen: +BS, obese  Musculoskeletal: min edema   Data Reviewed: Basic Metabolic Panel:  Recent Labs Lab 12/10/15 2201 12/12/15 0538 12/13/15 0552  NA 139 145 145  K 3.2* 2.8* 3.7  CL 91* 98* 105  CO2 37* 39* 35*  GLUCOSE 159* 86 90  BUN CREATININE 0.89 0.82 0.79  CALCIUM 9.1 8.8* 8.9  MG  --  2.2  --    Liver Function Tests:  Recent Labs Lab 12/10/15 2201  AST 24  ALT 22  ALKPHOS 68  BILITOT 0.6  PROT 7.4  ALBUMIN 3.7   No results for input(s): LIPASE, AMYLASE in the last 168 hours. No results for input(s): AMMONIA in the last 168 hours. CBC:  Recent Labs Lab 12/10/15 2201 12/12/15 0538 12/13/15 0552  WBC 6.0 4.8 3.6*  NEUTROABS 4.9  --   --   HGB 10.6* 10.4* 9.7*  HCT 34.9* 34.6* 31.7*  MCV 90.4 92.3 90.6  PLT 214 202 178   Cardiac Enzymes: No results for input(s): CKTOTAL, CKMB, CKMBINDEX, TROPONINI in the last 168 hours. BNP (last 3 results) No results for input(s): BNP in the last 8760 hours.  ProBNP (last 3 results) No results for input(s): PROBNP in the last 8760 hours.  CBG:  Recent Labs Lab 12/12/15 0747 12/12/15 1202 12/12/15 1816 12/12/15 2135 12/13/15 0752  GLUCAP 83 105* 96 101* 77    Recent Results (from the past 240  hour(s))  Blood Culture (routine x 2)     Status: None (Preliminary result)   Collection Time: 12/10/15 10:00 PM  Result Value Ref Range Status   Specimen Description BLOOD BLOOD RIGHT HAND  Final   Special Requests BOTTLES DRAWN AEROBIC AND ANAEROBIC 5CC  Final   Culture   Final    NO GROWTH 1 DAY [facility]    Report Status PENDING  Incomplete  Blood Culture (routine x 2)     Status: None (Preliminary result)   Collection Time: 12/10/15 10:04 PM  Result Value Ref Range Status   Specimen Description BLOOD LEFT ARM  Final   Special Requests BOTTLES DRAWN AEROBIC AND ANAEROBIC 5ML  Final   Culture   Final    NO GROWTH 1 DAY [facility]      Report Status PENDING  Incomplete  MRSA PCR Screening     Status: Abnormal   Collection Time: 12/11/15 12:58 PM  Result Value Ref Range Status   MRSA by PCR POSITIVE (A) NEGATIVE Final    Comment:        The GeneXpert MRSA Assay (FDA approved for NASAL specimens only), is one component of a comprehensive MRSA colonization surveillance program. It is not intended to diagnose MRSA infection nor to guide or monitor treatment for MRSA infections. RESULT CALLED TO, READ BACK BY AND VERIFIED WITH: NAKIYHA DUMAS,RN [id number] @ 1450 BY J SCOTTON      Studies: No results found.  Scheduled Meds: . bisacodyl  5 mg Oral QODAY  . buPROPion  300 mg Oral Daily  . cefTAZidime (FORTAZ)  IV  2 g Intravenous 3 times per day  . docusate sodium  200 mg Oral BID  . gabapentin  300 mg Oral BID  . heparin  5,000 Units Subcutaneous 3 times per day  . insulin aspart  0-5 Units Subcutaneous QHS  . insulin aspart  0-9 Units Subcutaneous TID WC  . insulin aspart protamine- aspart  25 Units Subcutaneous BID WC  . levothyroxine  250 mcg Oral QAC breakfast  . lubiprostone  24 mcg Oral BID WC  . methadone  30 mg Oral 3 times per day  . oxybutynin  5 mg Oral BID  . pantoprazole  40 mg Oral Daily  . PARoxetine  25 mg Oral Daily  . phenazopyridine  100 mg Oral TID WC  . polyethylene glycol  17 g Oral BID  . polyvinyl alcohol  1 drop Both Eyes BID  . sucralfate  1 g Oral TID WC & HS  . traZODone  100 mg Oral QHS   Continuous Infusions:  Antibiotics Given (last 72 hours)    Date/Time Action Medication Dose Rate   12/11/15 0609 Given   cefTAZidime (FORTAZ) 2 g in dextrose 5 % 50 mL IVPB 2 g 100 mL/hr   12/11/15 1343 Given   cefTAZidime (FORTAZ) 2 g in dextrose 5 % 50 mL IVPB 2 g 100 mL/hr   12/11/15 2221 Given   cefTAZidime (FORTAZ) 2 g in dextrose 5 % 50 mL IVPB 2 g 100 mL/hr   12/12/15 0512 Given   cefTAZidime (FORTAZ) 2 g in dextrose 5 % 50 mL IVPB 2 g 100 mL/hr   12/12/15 1513 Given    cefTAZidime (FORTAZ) 2 g in dextrose 5 % 50 mL IVPB 2 g 100 mL/hr   12/12/15 2123 Given   cefTAZidime (FORTAZ) 2 g in dextrose 5 % 50 mL IVPB 2 g 100 mL/hr  12/13/15 0622 Given   cefTAZidime (FORTAZ) 2 g in dextrose 5 % 50 mL IVPB 2 g 100 mL/hr      Principal Problem:   UTI (lower urinary tract infection) Active Problems:   Chronic pain syndrome   Morbid obesity (HCC)   Essential hypertension, benign   Diabetes mellitus type 2, controlled (HCC)   Hypokalemia    Time spent: 35 min    Montoya Watkin [facility]  [facility] Pager [phone]. If 7PM-7AM, please contact night-coverage at [email or web address], password [facility] 12/13/2015, 10:18 AM  LOS: 2 days

## 2015-12-13 NOTE — Progress Notes (Signed)
Pt stated that she will self administer CPAP when ready for bed.  RT to monitor and assess as needed.  

## 2015-12-13 NOTE — Consult Note (Signed)
Consultation Note Date: 12/13/2015   Patient Name: [name]  DOB: 11/20/1961  MRN: [id number]  Age / Sex: 54 y.o., female  PCP: Maxwell CaulMichael G Robson, MD Referring Physician: Joseph ArtJessica U Vann, DO  Reason for Consultation: Establishing goals of care and initial palliative care consultation.    Clinical Assessment/Narrative:  Patient is a 54 year old lady who lives in a nursing facility long-term. She has been admitted with a urinary tract infection. 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 She has other life limiting conditions of having been diagnosed with Lyme's disease, history of chronic pain, diabetes, hypertension, obesity hypoventilation syndrome/obstructive sleep apnea. History of chronic pain syndrome.  Patient has requested palliative care consultation.  Patient continues with her hospitalization. Her antibiotic regimen is being adjusted. Agree with not using IV opioids. Agree with continuation of current pain regimen. Extensive discussions with the patient about her multiple chronic illnesses in her current life situation. The patient states that she does not have a spouse does not have any children. She states that she has some distant cousins but otherwise has no family.  Patient states she would like to have the hope to eventually get discharged from her long-term facility. She wishes to live by herself. Extensive life review performed. Offered active listening and supportive care. All of the patient's questions answered to her satisfaction.  Contacts/Participants in Discussion: Primary Decision Maker: Patient herself who is currently decisional  Relationship to Patient self  HCPOA: no  There is no established healthcare power of attorney agent. Patient states she does not know who she can designate to be her surrogate  decision-maker. I have asked her to continue to reflect on selecting an appropriate agent to speak on her behalf when she is no longer able to.   SUMMARY OF RECOMMENDATIONS: CODE STATUS is DO NOT RESUSCITATE Continue current hospitalization Transition back to nursing facility when deemed medically stable No other palliative recommendations   Code Status/Advance Care Planning: DNR    Code Status Orders        Start     Ordered   12/11/15 0210  Do not attempt resuscitation (DNR)   Continuous    Question Answer Comment  In the event of cardiac or respiratory ARREST Do not call a "code blue"   In the event of cardiac or respiratory ARREST Do not perform Intubation, CPR, defibrillation or ACLS   In the event of cardiac or respiratory ARREST Use medication by any route, position, wound care, and other measures to relive pain and suffering. May use oxygen, suction and manual treatment of airway obstruction as needed for comfort.      12/11/15 0211    Advance Directive Documentation        Most Recent Value   Type of Advance Directive  Healthcare Power of Attorney, Living will   Pre-existing out of facility DNR order (yellow form or pink MOST form)     "MOST" Form in Place?        Other Directives:Other  Symptom Management:   Continue current pain regimen  Palliative Prophylaxis:   Delirium Protocol  Additional Recommendations (Limitations, Scope, Preferences):  Continue current treatment    Psycho-social/Spiritual:  Support System: Poor Desire for further Chaplaincy support:no Additional Recommendations: Caregiving  Support/Resources  Prognosis: Unable to determine  Discharge Planning: Skilled Nursing Facility for rehab with Palliative care service follow-up   Chief Complaint/ Primary Diagnoses: Present on Admission:  . Chronic pain syndrome . Essential hypertension,  benign . UTI (lower urinary tract infection) . Hypokalemia . Morbid obesity (HCC)  I have  reviewed the medical record, interviewed the patient and family, and examined the patient. The following aspects are pertinent.  Past Medical History  Diagnosis Date  . Diabetes mellitus without complication (HCC)   . Chronic pain   . Morbid obesity (HCC)   . OSA (obstructive sleep apnea)   . Generalized weakness   . Fibromyalgia   . Lyme disease   . Anemia   . History of blood transfusion     pt states she has had 5 blood transfusions  . Sleep disorder   . UTI (lower urinary tract infection) 02/22/2014  . Hypertension   . Hypothyroidism   . COPD (chronic obstructive pulmonary disease) (HCC)   . Shortness of breath   . GERD (gastroesophageal reflux disease)    Social History   Social History  . Marital Status: Single    Spouse Name: N/A  . Number of Children: N/A  . Years of Education: N/A   Social History Main Topics  . Smoking status: Former Smoker    Quit date: 06/22/2008  . Smokeless tobacco: None  . Alcohol Use: No  . Drug Use: No  . Sexual Activity: No   Other Topics Concern  . None   Social History Narrative   Family History  Problem Relation Age of Onset  . Diabetes Mellitus II Mother    Scheduled Meds: . bisacodyl  5 mg Oral QODAY  . buPROPion  300 mg Oral Daily  . cefTAZidime (FORTAZ)  IV  2 g Intravenous 3 times per day  . docusate sodium  200 mg Oral BID  . gabapentin  300 mg Oral BID  . heparin  5,000 Units Subcutaneous 3 times per day  . insulin aspart  0-5 Units Subcutaneous QHS  . insulin aspart  0-9 Units Subcutaneous TID WC  . insulin aspart protamine- aspart  25 Units Subcutaneous BID WC  . levothyroxine  250 mcg Oral QAC breakfast  . lubiprostone  24 mcg Oral BID WC  . methadone  30 mg Oral 3 times per day  . oxybutynin  5 mg Oral BID  . pantoprazole  40 mg Oral Daily  . PARoxetine  25 mg Oral Daily  . phenazopyridine  100 mg Oral TID WC  . polyethylene glycol  17 g Oral BID  . polyvinyl alcohol  1 drop Both Eyes BID  . sucralfate   1 g Oral TID WC & HS  . traZODone  100 mg Oral QHS   Continuous Infusions:  PRN Meds:.acetaminophen, benzocaine, diazepam, diazepam, guaiFENesin, nitroGLYCERIN, oxyCODONE Medications Prior to Admission:  Prior to Admission medications   Medication Sig Start Date End Date Taking? Authorizing Provider  acetaminophen (TYLENOL) 325 MG tablet Take 650 mg by mouth every 4 (four) hours as needed for mild pain.   Yes Historical Provider, MD  benzocaine (ORAJEL) 10 % mucosal gel Use as directed 1 application in the mouth or throat 3 (three) times daily as needed for mouth pain.   Yes Historical Provider, MD  bisacodyl (DULCOLAX) 10 MG suppository Place 10 mg rectally as needed for moderate constipation.   Yes Historical Provider, MD  buPROPion (WELLBUTRIN XL) 300 MG 24 hr tablet Take 300 mg by mouth daily.   Yes Historical Provider, MD  cloNIDine (CATAPRES) 0.1 MG tablet Take 0.1 mg by mouth as needed.   Yes Historical Provider, MD  diazepam (VALIUM) 5 MG tablet Take 1-2 tablets (  5-10 mg total) by mouth every 12 (twelve) hours as needed for anxiety (TAKES  IN AM AND  IN PM, ONLY AS NEEDED FOR ANXIETY). 11/12/15  Yes Dorothea Ogle, MD  docusate sodium 100 MG CAPS Take 200 mg by mouth 2 (two) times daily. 03/08/14  Yes Leroy Sea, MD  gabapentin (NEURONTIN) 300 MG capsule Take 300 mg by mouth 2 (two) times daily.   Yes Historical Provider, MD  guaifenesin (ROBITUSSIN) 100 MG/5ML syrup Take 200 mg by mouth every 4 (four) hours as needed for cough.   Yes Historical Provider, MD  hydrochlorothiazide (HYDRODIURIL) 25 MG tablet Take 25 mg by mouth daily.   Yes Historical Provider, MD  insulin lispro (HUMALOG) 100 UNIT/ML injection Inject 0-8 Units into the skin See admin instructions. Sliding Scale - Inject before each meal and at bed time   Yes Historical Provider, MD  insulin NPH-regular Human (NOVOLIN 70/30) (70-30) 100 UNIT/ML injection Inject 25 Units into the skin 2 (two) times daily with a  meal. 03/11/14  Yes Leroy Sea, MD  levothyroxine (SYNTHROID, LEVOTHROID) 125 MCG tablet Take 250 mcg by mouth daily before breakfast.   Yes Historical Provider, MD  lubiprostone (AMITIZA) 24 MCG capsule Take 24 mcg by mouth 2 (two) times daily with a meal.   Yes Historical Provider, MD  methadone (DOLOPHINE) 10 MG tablet Take three tablets by mouth three times daily for pain 12/08/15  Yes Tiffany L Reed, DO  nitroGLYCERIN (NITROSTAT) 0.4 MG SL tablet Place 0.4 mg under the tongue every 5 (five) minutes as needed for chest pain.   Yes Historical Provider, MD  omeprazole (PRILOSEC) 20 MG capsule Take 20 mg by mouth daily.   Yes Historical Provider, MD  oxybutynin (DITROPAN) 5 MG tablet Take 5 mg by mouth 2 (two) times daily.    Yes Historical Provider, MD  Oxycodone HCl 10 MG TABS Take one tablet by mouth every 4 hours as needed for pain 11/11/15  Yes Kathlen Mody, MD  PARoxetine (PAXIL-CR) 25 MG 24 hr tablet Take 1 tablet (25 mg total) by mouth daily. 10/31/15  Yes Costin Otelia Sergeant, MD  phenazopyridine (PYRIDIUM) 200 MG tablet Take 200 mg by mouth 3 (three) times daily as needed for pain.   Yes Historical Provider, MD  polyethylene glycol (MIRALAX / GLYCOLAX) packet Take 17 g by mouth 2 (two) times daily. 03/08/14  Yes Leroy Sea, MD  Polyvinyl Alcohol-Povidone (FRESHKOTE) 2.7-2 % SOLN Place 1 drop into both eyes 2 (two) times daily.   Yes Historical Provider, MD  potassium chloride (MICRO-K) 10 MEQ CR capsule Take 10 mEq by mouth daily.   Yes Historical Provider, MD  promethazine (PHENERGAN) 50 MG/ML injection Inject 25 mg into the muscle every 6 (six) hours as needed for nausea or vomiting.   Yes Historical Provider, MD  sucralfate (CARAFATE) 1 G tablet Take 1 g by mouth 4 (four) times daily -  with meals and at bedtime.   Yes Historical Provider, MD  traZODone (DESYREL) 100 MG tablet Take 100 mg by mouth at bedtime.   Yes Historical Provider, MD  Vitamin D, Ergocalciferol, (DRISDOL) 50000  UNITS CAPS capsule Take 50,000 Units by mouth every 14 (fourteen) days.   Yes Historical Provider, MD   Allergies  Allergen Reactions  . Ibuprofen Other (See Comments) and Shortness Of Breath  . Sulfa Antibiotics Other (See Comments), Itching and Shortness Of Breath  . Levofloxacin Nausea Only    Other fluoroquinolones OK.  . Lisinopril  Other (See Comments)  . Penicillins Other (See Comments) and Hives    Review of Systems Positive for fatigue  Physical Exam No acute distress obese lady resting in bed Lungs clear to auscultation anterior lung fields next line S1-S2 Abdomen soft mildly distended Extremities trace edema Nonfocal  Flat affect  Vital Signs: BP 153/81 mmHg  Pulse 57  Temp(Src) 98.4 F (36.9 C) (Oral)  Resp 16  Ht  (1.651 m)  Wt 131.9 kg (290 lb 12.6 oz)  BMI 48.39 kg/m2  SpO2 100%  SpO2: SpO2: 100 % O2 Device:SpO2: 100 % O2 Flow Rate: .O2 Flow Rate (L/min): 4 L/min  IO: Intake/output summary:  Intake/Output Summary (Last 24 hours) at 12/13/15 1657 Last data filed at 12/13/15 1415  Gross per 24 hour  Intake    390 ml  Output   4550 ml  Net  -4160 ml    LBM: Last BM Date: 12/09/15 Baseline Weight: Weight: (!) 137.44 kg (303 lb) Most recent weight: Weight: 131.9 kg (290 lb 12.6 oz)      Palliative Assessment/Data:  Flowsheet Rows        Most Recent Value   Intake Tab    Referral Department  Hospitalist   Unit at Time of Referral  Med/Surg Unit   Palliative Care Primary Diagnosis  Other (Comment)   Date Notified  12/12/15   Palliative Care Type  New Palliative care   Reason for referral  Clarify Goals of Care   Date of Admission  12/10/15   Date first seen by Palliative Care  12/13/15   # of days IP prior to Palliative referral  2   Clinical Assessment    Palliative Performance Scale Score  40%   Psychosocial & Spiritual Assessment    Palliative Care Outcomes    Patient/Family meeting held?  No      Additional Data Reviewed:  CBC:     Component Value Date/Time   WBC 3.6* 12/13/2015 0552   HGB 9.7* 12/13/2015 0552   HCT 31.7* 12/13/2015 0552   PLT 178 12/13/2015 0552   MCV 90.6 12/13/2015 0552   NEUTROABS 4.9 12/10/2015 2201   LYMPHSABS 0.4* 12/10/2015 2201   MONOABS 0.7 12/10/2015 2201   EOSABS 0.0 12/10/2015 2201   BASOSABS 0.0 12/10/2015 2201   Comprehensive Metabolic Panel:    Component Value Date/Time   NA 145 12/13/2015 0552   K 3.7 12/13/2015 0552   CL 105 12/13/2015 0552   CO2 35* 12/13/2015 0552   BUN 8 12/13/2015 0552   CREATININE 0.79 12/13/2015 0552   GLUCOSE 90 12/13/2015 0552   CALCIUM 8.9 12/13/2015 0552   AST 24 12/10/2015 2201   ALT 22 12/10/2015 2201   ALKPHOS 68 12/10/2015 2201   BILITOT 0.6 12/10/2015 2201   PROT 7.4 12/10/2015 2201   ALBUMIN 3.7 12/10/2015 2201     Time In: 1100 Time Out: 1200 Time Total: 60 min  Greater than 50%  of this time was spent counseling and coordinating care related to the above assessment and plan.  Signed by: Rosalin Hawking, MD [id number] Rosalin Hawking, MD  12/13/2015, 4:57 PM  Please contact Palliative Medicine Team phone at [phone] for questions and concerns.

## 2015-12-14 LAB — URINE CULTURE: CULTURE: NO GROWTH

## 2015-12-14 LAB — GLUCOSE, CAPILLARY
GLUCOSE-CAPILLARY: 128 mg/dL — AB (ref 65–99)
GLUCOSE-CAPILLARY: 41 mg/dL — AB (ref 65–99)
GLUCOSE-CAPILLARY: 76 mg/dL (ref 65–99)
GLUCOSE-CAPILLARY: 128 mg/dL — AB (ref 65–99)
Glucose-Capillary: 112 mg/dL — ABNORMAL HIGH (ref 65–99)
Glucose-Capillary: 150 mg/dL — ABNORMAL HIGH (ref 65–99)

## 2015-12-14 MED ORDER — CAMPHOR-MENTHOL 0.5-0.5 % EX LOTN
TOPICAL_LOTION | CUTANEOUS | Status: DC | PRN
  Filled 2015-12-14: qty 222

## 2015-12-14 MED ORDER — BISACODYL 10 MG RE SUPP
10.0000 mg | Freq: Every day | RECTAL | Status: DC | PRN
  Administered 2015-12-14: 10 mg via RECTAL
  Filled 2015-12-14: qty 1
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
## 2015-12-14 NOTE — Clinical Social Work Note (Signed)
Clinical Social Work Assessment  Patient Details  Name: [name] MRN: [id number] Date of Birth: 05-Apr-1961  Date of referral:  12/14/15               Reason for consult:  Facility Placement (Return to [facility])                Permission sought to share information with:  Facility Sport and exercise psychologist (Patient declines any family contacts) Permission granted to share information::  Yes, Verbal Permission Granted      Housing/Transportation Living arrangements for the past 2 months:  [place] of Information:  Patient Patient Interpreter Needed:  None Criminal Activity/Legal Involvement Pertinent to Current Situation/Hospitalization:  No - Comment as needed Significant Relationships:  Other Family Members, Parents (Nephew: Rosary Lively   Patient has very limited support) Lives with:  Facility Resident Do you feel safe going back to the place where you live?  Yes (Would prefer to have her "own apartment" but is agreeable to return to SNF.) Need for family participation in patient care:  No (Coment)  Care giving concerns:  Current resident of [facility]- wants to live independently but recognizes that she cannot manage on her own. She wants 8 hours of CAP daily 7 days a week and for CSW to locate a house or apartment for her. "I need a CPAP or BIPAP- I am almost DYING every night at the nursing home!"  Social Worker assessment / plan:  CSW met with patient today to discuss d/c planning needs.  CSW is aware of patient from recent past hospitalization at [facility].  She is a current resident of [facility] and returned to SNF rehab there. Her current complaint is that she needs a CPAP or BIPAP machine for use in the SNF as she feels that she is getting sicker because of lack of oxygen.  Patient indicates that facility MD was aware of need for machine and has indicated that she needed one but still doesn't have one. CSW spoke with Dr. Lucianne Lei who indicates that patient  does need a CPAP machine at the facility and she will place this order on d/c summary.  Weekday CSW will be asked to follow up with [facility] to insure that one is in place for patient.  Fl2 is on chart for MD's signature.  CPAP added.   Patient also wants to complete another Advanced Directive as she states that she wants to make changes since completing her last one.  CSW provided her with a new Advanced Directive form and she states she will read it this weekend. Will need Pastoral Care services to follow up with her on Monday.     Employment status:  Disabled (Comment on whether or not currently receiving Disability) Insurance information:  Medicare PT Recommendations:  [place] / Referral to community resources:  [place]  Patient/Family's Response to care:  Patient has multiple issues/complaints about her medical condition. Currently states that her back hurts her and "wants something done."  Dr. Eliseo Squires is aware.    Patient/Family's Understanding of and Emotional Response to Diagnosis, Current Treatment, and Prognosis:  Patient has a good understanding of her current diagnosis and treatment plan- however, she doesn't always agree with it.  At times she will state that she feels she can live independently with a in home caregiver 8 hours a day; other times she states she is "worse" and needs SNF care.  She becomes very frustrated and emotional about SNF  placement and verbalizes her unhappiness with placement. She states she would be unhappy with ANY SNF- however, she does recognize the need for support and assistance.  Patient is at times anxious and will continually call the desk asking for CSW to return to her room "one more time."  Support and reassurance provided; however- patient often wants to vent about problems that occurred in her life over 20 years ago.  She would benefit from some extended therapy and support- however- with her medical needs- this  would be difficult to arrange.    Emotional Assessment Appearance:  Appears older than stated age Attitude/Demeanor/Rapport:  Apprehensive, Inconsistent, Complaining, Self-Absorbed Affect (typically observed):  Calm, Anxious Orientation:  Oriented to Self, Oriented to Place, Oriented to  Time, Oriented to Situation Alcohol / Substance use:  Tobacco Use (Former smoker- quit 7 years ago) Psych involvement (Current and /or in the community):  No (Comment)  Discharge Needs  Concerns to be addressed:  Care Coordination Readmission within the last 30 days:  Yes Current discharge risk:  Dependent with Mobility, Lack of support system Barriers to Discharge:  Continued Medical Work up, Other (Patient is unhappy with SNF placement)   Williemae Area, LCSW 12/14/2015, 2:14 PM

## 2015-12-14 NOTE — Progress Notes (Signed)
PROGRESS NOTE  Karen BarrioChristy Walls UJW:[id number] DOB: 10/14/1961 DOA: 12/10/2015 PCP: Terald SleeperOBSON,Karen GAVIN, MD  Assessment/Plan: UTI - [name]- until d/c'd then q 3 days of fosfomycin for total of 14 days  h/o pseudomonas and MDRO causing UTI just last month -has supapubic foley catheter  -culture drawn AFTER abx unfortunately  Chronic pain syndrome - continue home meds, avoid escalation of narcotics  HTN - continue home meds, hold HCTZ  DM2 - continue home 70/30 and SSI -requesting regular diet  Hypokalemia - replacing -check Mg ok  Depression -continue home meds - she has said in the past that she will do "anything" to stay in the hospital  OSA- prob a component of obesity hypoventilation as well (confirmed in record from Dr. Anola [facility] in [place]) -records from [place] show CPAP back in 2009 (BiPAP at 15/10) per PCP note but it appears patient has refused repeat, someone ordered CPAP while in hospital, not sure this can be arranged in SNF -patient did have desaturations in the ER while sleeping   Morbid obesity  Records from [facility] reviewed back to 2013   Code Status: DNR Family Communication: patient Disposition Plan: SNF on Monday   Consultants:  Palliative care  Procedures:     HPI/Subjective: Convinced her gall bladder needs to be removed  Objective: Filed Vitals:   12/13/15 2209 12/14/15 0635  BP: 129/59 134/60  Pulse: 62 57  Temp: 99 F (37.2 C) 97.9 F (36.6 C)  Resp: 18 18    Intake/Output Summary (Last 24 hours) at 12/14/15 1357 Last data filed at 12/14/15 1130  Gross per 24 hour  Intake    600 ml  Output   4800 ml  Net  -4200 ml   [facility]   12/10/15 2217 12/11/15 0331  Weight: 137.44 kg (303 lb) 131.9 kg (290 lb 12.6 oz)    Exam:   General:  Awake, multiple questions answered,   Cardiovascular: rrr  Respiratory: diminished  Abdomen: +BS, obese  Musculoskeletal: min edema   Data Reviewed: Basic Metabolic  Panel:  Recent Labs Lab 12/10/15 2201 12/12/15 0538 12/13/15 0552  NA 139 145 145  K 3.2* 2.8* 3.7  CL 91* 98* 105  CO2 37* 39* 35*  GLUCOSE 159* 86 90  BUN 12 10 8   CREATININE 0.89 0.82 0.79  CALCIUM 9.1 8.8* 8.9  MG  --  2.2  --    Liver Function Tests:  Recent Labs Lab 12/10/15 2201  AST 24  ALT 22  ALKPHOS 68  BILITOT 0.6  PROT 7.4  ALBUMIN 3.7   No results for input(s): LIPASE, AMYLASE in the last 168 hours. No results for input(s): AMMONIA in the last 168 hours. CBC:  Recent Labs Lab 12/10/15 2201 12/12/15 0538 12/13/15 0552  WBC 6.0 4.8 3.6*  NEUTROABS 4.9  --   --   HGB 10.6* 10.4* 9.7*  HCT 34.9* 34.6* 31.7*  MCV 90.4 92.3 90.6  PLT 214 202 178   Cardiac Enzymes: No results for input(s): CKTOTAL, CKMB, CKMBINDEX, TROPONINI in the last 168 hours. BNP (last 3 results) No results for input(s): BNP in the last 8760 hours.  ProBNP (last 3 results) No results for input(s): PROBNP in the last 8760 hours.  CBG:  Recent Labs Lab 12/13/15 1144 12/13/15 1609 12/13/15 2215 12/14/15 0744 12/14/15 1136  GLUCAP 60* 90 170* 128* 112*    Recent Results (from the past 240 hour(s))  Blood Culture (routine x 2)     Status: None (Preliminary result)  Collection Time: 12/10/15 10:00 PM  Result Value Ref Range Status   Specimen Description BLOOD BLOOD RIGHT HAND  Final   Special Requests BOTTLES DRAWN AEROBIC AND ANAEROBIC 5CC  Final   Culture   Final    NO GROWTH 3 DAYS Performed at [facility]    Report Status PENDING  Incomplete  Blood Culture (routine x 2)     Status: None (Preliminary result)   Collection Time: 12/10/15 10:04 PM  Result Value Ref Range Status   Specimen Description BLOOD LEFT ARM  Final   Special Requests BOTTLES DRAWN AEROBIC AND ANAEROBIC  Final   Culture   Final    NO GROWTH 3 DAYS Performed at [facility]    Report Status PENDING  Incomplete  MRSA PCR Screening     Status: Abnormal   Collection  Time: 12/11/15 12:58 PM  Result Value Ref Range Status   MRSA by PCR POSITIVE (A) NEGATIVE Final    Comment:        The GeneXpert MRSA Assay (FDA approved for NASAL specimens only), is one component of a comprehensive MRSA colonization surveillance program. It is not intended to diagnose MRSA infection nor to guide or monitor treatment for MRSA infections. RESULT CALLED TO, READ BACK BY AND VERIFIED WITH: [name] [id number] @ 1450 BY J SCOTTON   Urine culture     Status: None   Collection Time: 12/12/15  3:30 PM  Result Value Ref Range Status   Specimen Description URINE, SUPRAPUBIC  Final   Special Requests NONE  Final   Culture   Final    NO GROWTH 2 DAYS Performed at [facility]    Report Status 12/14/2015 FINAL  Final     Studies: No results found.  Scheduled Meds: . bisacodyl  5 mg Oral QODAY  . buPROPion  300 mg Oral Daily  . cefTAZidime (FORTAZ)  IV  2 g Intravenous 3 times per day  . docusate sodium  200 mg Oral BID  . gabapentin  300 mg Oral BID  . heparin  5,000 Units Subcutaneous 3 times per day  . insulin aspart  0-5 Units Subcutaneous QHS  . insulin aspart  0-9 Units Subcutaneous TID WC  . insulin aspart protamine- aspart  25 Units Subcutaneous BID WC  . levothyroxine  250 mcg Oral QAC breakfast  . lubiprostone  24 mcg Oral BID WC  . methadone  30 mg Oral 3 times per day  . oxybutynin  5 mg Oral BID  . pantoprazole  40 mg Oral Daily  . PARoxetine  25 mg Oral Daily  . phenazopyridine  100 mg Oral TID WC  . polyethylene glycol  17 g Oral BID  . polyvinyl alcohol  1 drop Both Eyes BID  . sucralfate  1 g Oral TID WC & HS  . traZODone  100 mg Oral QHS   Continuous Infusions:  Antibiotics Given (last 72 hours)    Date/Time Action Medication Dose Rate   12/11/15 2221 Given   cefTAZidime (FORTAZ) 2 g in dextrose 5 % 50 mL IVPB 2 g 100 mL/hr   12/12/15 0512 Given   cefTAZidime (FORTAZ) 2 g in dextrose 5 % 50 mL IVPB 2 g 100 mL/hr   12/12/15  1513 Given   cefTAZidime (FORTAZ) 2 g in dextrose 5 % 50 mL IVPB 2 g 100 mL/hr   12/12/15 2123 Given   cefTAZidime (FORTAZ) 2 g in dextrose 5 % 50 mL IVPB 2  g 100 mL/hr   12/13/15 0622 Given   cefTAZidime (FORTAZ) 2 g in dextrose 5 % 50 mL IVPB 2 g 100 mL/hr   12/13/15 1446 Given   cefTAZidime (FORTAZ) 2 g in dextrose 5 % 50 mL IVPB 2 g 100 mL/hr   12/13/15 2124 Given   cefTAZidime (FORTAZ) 2 g in dextrose 5 % 50 mL IVPB 2 g 100 mL/hr   12/14/15 0558 Given   cefTAZidime (FORTAZ) 2 g in dextrose 5 % 50 mL IVPB 2 g 100 mL/hr   12/14/15 1339 Given   cefTAZidime (FORTAZ) 2 g in dextrose 5 % 50 mL IVPB 2 g 100 mL/hr      Principal Problem:   UTI (lower urinary tract infection) Active Problems:   Chronic pain syndrome   Morbid obesity (HCC)   Essential hypertension, benign   Diabetes mellitus type 2, controlled (HCC)   Hypokalemia   Encounter for palliative care   Counseling regarding advanced care planning and goals of care    Time spent: 25 min    Aragorn Recker U [facility]  [facility] Pager [phone]. If 7PM-7AM, please contact night-coverage at [email or web address], password [facility] 12/14/2015, 1:57 PM  LOS: 3 days

## 2015-12-15 LAB — GLUCOSE, CAPILLARY
GLUCOSE-CAPILLARY: 127 mg/dL — AB (ref 65–99)
GLUCOSE-CAPILLARY: 196 mg/dL — AB (ref 65–99)

## 2015-12-15 MED ORDER — BISACODYL 5 MG PO TBEC: 5.0000 mg | DELAYED_RELEASE_TABLET | ORAL | Status: AC

## 2015-12-15 MED ORDER — DIAZEPAM 5 MG PO TABS: 5.0000 mg | ORAL_TABLET | Freq: Two times a day (BID) | ORAL | Status: AC | PRN

## 2015-12-15 MED ORDER — FOSFOMYCIN TROMETHAMINE 3 G PO PACK: 3.0000 g | PACK | Freq: Once | ORAL | Status: DC

## 2015-12-15 MED ORDER — CAMPHOR-MENTHOL 0.5-0.5 % EX LOTN: TOPICAL_LOTION | CUTANEOUS | Status: DC | PRN

## 2015-12-15 MED ORDER — OXYCODONE HCL 10 MG PO TABS: ORAL_TABLET | ORAL | Status: DC

## 2015-12-15 NOTE — Progress Notes (Signed)
Pt discharged from the unit via [facility]. Discharge papers were given to the transporters. Pt was insisting on discharge that her PASAAR number was expiring and would not leave the unit until she talked to the social worker. Social worker talked to the pt and informed her that her PASAAR number still is active and pt was okay for discharge. Pt belongings sent with pt. No questions or complaints from the pt at this time.  Shephanie Romas W Jaskirat Schwieger, RN

## 2015-12-15 NOTE — Discharge Summary (Addendum)
Physician Discharge Summary  Karen BarrioChristy Delval ZOX:[id number] DOB: 12/12/1961 DOA: 12/10/2015  PCP: Terald SleeperOBSON,MICHAEL GAVIN, MD  Admit date: 12/10/2015 Discharge date: 12/15/2015  Time spent: 35 minutes  Recommendations for Outpatient Follow-up:  To SNF May need frequent psych/MD/counselling visits to address multiple issues- very difficult patient with multiple psych issues and an unrealistic expectations for her care-- DNR bipap for OSA/obesity hypoventilation   Discharge Diagnoses:  Principal Problem:   UTI (lower urinary tract infection) Active Problems:   Chronic pain syndrome   Morbid obesity (HCC)   Essential hypertension, benign   Diabetes mellitus type 2, controlled (HCC)   Hypokalemia   Encounter for palliative care   Counseling regarding advanced care planning and goals of care   Discharge Condition: improved  Diet recommendation: carb mod-- patient refused here  [facility]   12/10/15 2217 12/11/15 0331  Weight: 137.44 kg (303 lb) 131.9 kg (290 lb 12.6 oz)    History of present illness:  [name] is a 54 y.o. female who comes in from her SNF with c/o vomiting since earlier in the day. Patient was prescribed phenergan which she refused to take and instead insisted on coming to ED.  In ED patient was found to have fever of 102.6, secondary to UTI. Patient being admitted for UTI treatment especially as recent UTI last month had pseudomonas and a MDRO P. Stuartii.  Hospital Course:  UTI -culture drawn AFTER abx unfortunately-- so culture NGTD  Fortaz- until d/c and then 1 dose of fosfomycin h/o pseudomonas and MDRO causing UTI just last month -has supapubic foley catheter- so UTI caused by this   Chronic pain syndrome - continue home meds  HTN - continue home meds, hold HCTZ  DM2 - continue home 70/30 and SSI -requesting regular diet here  Hypokalemia - replacing -Mg ok  Depression -continue home meds - she has said in the past  that she will do "anything" to stay in the hospital  OSA- prob a component of obesity hypoventilation as well (confirmed in record from Dr. Anola [facility]Walls- [facility] in [place]) -records from [place] show CPAP back in 2009 (BiPAP at 15/10) per PCP note but it appears patient has refused repeat, someone ordered CPAP while in hospital, not sure this can be arranged in SNF -patient did have desaturations in the ER while sleeping  C/o RUQ pain- CT scan ok, labs ok -periodic LFTs  Morbid obesity   Procedures:    Consultations:  Palliative care  Discharge Exam: Filed Vitals:   12/14/15 2149 12/15/15 0602  BP: 129/60 110/51  Pulse: 59 53  Temp: 98.4 F (36.9 C) 98.4 F (36.9 C)  Resp: 18 19    General: awake, multiple complaints Cardiovascular: rrr   Discharge Instructions   Discharge Instructions    Diet Carb Modified    Complete by:  As directed      Discharge instructions    Complete by:  As directed   1 dose of fosfomycin on 12/20 bipap at night and while sleeping due to OSA/obesity hypoventilation O2 when not on Bipap     Increase activity slowly    Complete by:  As directed           Current Discharge Medication List    START taking these medications   Details  camphor-menthol (SARNA) lotion Apply topically as needed for itching. Qty: 222 mL, Refills: 0    fosfomycin (MONUROL) 3 G PACK Take 3 g by mouth once. Qty: 1 g      CONTINUE  these medications which have CHANGED   Details  bisacodyl (DULCOLAX) 5 MG EC tablet Take 1 tablet (5 mg total) by mouth every other day. Qty: 30 tablet, Refills: 0    diazepam (VALIUM) 5 MG tablet Take 1-2 tablets (5-10 mg total) by mouth every 12 (twelve) hours as needed for anxiety (TAKES  IN AM AND  IN PM, ONLY AS NEEDED FOR ANXIETY). Qty: 10 tablet, Refills: 0    Oxycodone HCl 10 MG TABS Take one tablet by mouth every 4 hours as needed for pain Qty: 10 each, Refills: 0      CONTINUE these medications which have NOT  CHANGED   Details  acetaminophen (TYLENOL) 325 MG tablet Take 650 mg by mouth every 4 (four) hours as needed for mild pain.    benzocaine (ORAJEL) 10 % mucosal gel Use as directed 1 application in the mouth or throat 3 (three) times daily as needed for mouth pain.    bisacodyl (DULCOLAX) 10 MG suppository Place 10 mg rectally as needed for moderate constipation.    buPROPion (WELLBUTRIN XL) 300 MG 24 hr tablet Take 300 mg by mouth daily.    docusate sodium 100 MG CAPS Take 200 mg by mouth 2 (two) times daily. Qty: 10 capsule, Refills: 0    gabapentin (NEURONTIN) 300 MG capsule Take 300 mg by mouth 2 (two) times daily.    guaifenesin (ROBITUSSIN) 100 MG/5ML syrup Take 200 mg by mouth every 4 (four) hours as needed for cough.    insulin lispro (HUMALOG) 100 UNIT/ML injection Inject 0-8 Units into the skin See admin instructions. Sliding Scale - Inject before each meal and at bed time    insulin NPH-regular Human (NOVOLIN 70/30) (70-30) 100 UNIT/ML injection Inject 25 Units into the skin 2 (two) times daily with a meal. Qty: 10 mL, Refills: 11    levothyroxine (SYNTHROID, LEVOTHROID) 125 MCG tablet Take 250 mcg by mouth daily before breakfast.    lubiprostone (AMITIZA) 24 MCG capsule Take 24 mcg by mouth 2 (two) times daily with a meal.    methadone (DOLOPHINE) 10 MG tablet Take three tablets by mouth three times daily for pain Qty: 180 tablet, Refills: 0    nitroGLYCERIN (NITROSTAT) 0.4 MG SL tablet Place 0.4 mg under the tongue every 5 (five) minutes as needed for chest pain.    omeprazole (PRILOSEC) 20 MG capsule Take 20 mg by mouth daily.    oxybutynin (DITROPAN) 5 MG tablet Take 5 mg by mouth 2 (two) times daily.     PARoxetine (PAXIL-CR) 25 MG 24 hr tablet Take 1 tablet (25 mg total) by mouth daily.    polyethylene glycol (MIRALAX / GLYCOLAX) packet Take 17 g by mouth 2 (two) times daily. Qty: 14 each, Refills: 0    Polyvinyl Alcohol-Povidone (FRESHKOTE) 2.7-2 % SOLN Place  1 drop into both eyes 2 (two) times daily.    potassium chloride (MICRO-K) 10 MEQ CR capsule Take 10 mEq by mouth daily.    promethazine (PHENERGAN) 50 MG/ML injection Inject 25 mg into the muscle every 6 (six) hours as needed for nausea or vomiting.    sucralfate (CARAFATE) 1 G tablet Take 1 g by mouth 4 (four) times daily -  with meals and at bedtime.    traZODone (DESYREL) 100 MG tablet Take 100 mg by mouth at bedtime.    Vitamin D, Ergocalciferol, (DRISDOL) 50000 UNITS CAPS capsule Take 50,000 Units by mouth every 14 (fourteen) days.      STOP taking these  medications     cloNIDine (CATAPRES) 0.1 MG tablet      hydrochlorothiazide (HYDRODIURIL) 25 MG tablet      phenazopyridine (PYRIDIUM) 200 MG tablet        Allergies  Allergen Reactions  . Ibuprofen Other (See Comments) and Shortness Of Breath  . Sulfa Antibiotics Other (See Comments), Itching and Shortness Of Breath  . Levofloxacin Nausea Only    Other fluoroquinolones OK.  . Lisinopril Other (See Comments)  . Penicillins Other (See Comments) and Hives      The results of significant diagnostics from this hospitalization (including imaging, microbiology, ancillary and laboratory) are listed below for reference.    Significant Diagnostic Studies: Dg Chest 2 View  12/10/2015  CLINICAL DATA:  Fever, abd pain, patient yelling that she hurts all over EXAM: CHEST  2 VIEW COMPARISON:  11/11/2015 FINDINGS: Lateral view moderately degraded by patient body habitus and positioning. Midline trachea. Cardiomegaly accentuated by AP portable technique. No pleural effusion or pneumothorax. Similar appearance of patchy left base opacity. No congestive failure. IMPRESSION: Similar patchy left base opacity, favoring atelectasis. Cardiomegaly without congestive failure. Electronically Signed   By: Jeronimo Greaves M.D.   On: 12/10/2015 23:04   Ct Abdomen Pelvis W Contrast  12/11/2015  CLINICAL DATA:  Vomiting. Generalized abdominal pain.  Patient was seen by MD earlier today. Patient is refusing to take prescribed medications. EXAM: CT ABDOMEN AND PELVIS WITH CONTRAST TECHNIQUE: Multidetector CT imaging of the abdomen and pelvis was performed using the standard protocol following bolus administration of intravenous contrast. CONTRAST:  OMNIPAQUE IOHEXOL 300 MG/ML  SOLN COMPARISON:  11/07/2015 FINDINGS: Linear atelectasis in the right lung base. There is laxity of the anterior abdominal wall with portions of the anterior and right lateral abdomen not included within the field of view of this study. The spleen is mildly enlarged but otherwise homogeneous. The liver, gallbladder, pancreas, adrenal glands, kidneys, abdominal aorta, inferior vena cava, and retroperitoneal lymph nodes are unremarkable. Visualized stomach, small bowel, and colon are not abnormally distended. No free air or free fluid demonstrated in the visualized abdomen. Pelvis: Uterus and ovaries are not enlarged. Nodular appearance to the uterus is consistent with uterine fibroids. Suprapubic catheter deflects the bladder. No free or loculated pelvic fluid collections. No pelvic mass or lymphadenopathy. The rectosigmoid colon is decompressed but there appears to be thickening of the wall of the rectosigmoid colon which may indicate proctocolitis. This appearance is similar to previous study. IMPRESSION: No acute process demonstrated in the abdomen or pelvis. No evidence of bowel obstruction. Nonspecific thickening of the wall of the rectosigmoid colon may represent proctocolitis but is unchanged since prior study. Uterine fibroids. Suprapubic catheter. Mild splenic enlargement. Electronically Signed   By: Burman Nieves M.D.   On: 12/11/2015 00:15    Microbiology: Recent Results (from the past 240 hour(s))  Blood Culture (routine x 2)     Status: None (Preliminary result)   Collection Time: 12/10/15 10:00 PM  Result Value Ref Range Status   Specimen Description BLOOD  BLOOD RIGHT HAND  Final   Special Requests BOTTLES DRAWN AEROBIC AND ANAEROBIC 5CC  Final   Culture   Final    NO GROWTH 3 DAYS Performed at [facility]    Report Status PENDING  Incomplete  Blood Culture (routine x 2)     Status: None (Preliminary result)   Collection Time: 12/10/15 10:04 PM  Result Value Ref Range Status   Specimen Description BLOOD LEFT ARM  Final  Special Requests BOTTLES DRAWN AEROBIC AND ANAEROBIC  Final   Culture   Final    NO GROWTH 3 DAYS Performed at [facility]    Report Status PENDING  Incomplete  MRSA PCR Screening     Status: Abnormal   Collection Time: 12/11/15 12:58 PM  Result Value Ref Range Status   MRSA by PCR POSITIVE (A) NEGATIVE Final    Comment:        The GeneXpert MRSA Assay (FDA approved for NASAL specimens only), is one component of a comprehensive MRSA colonization surveillance program. It is not intended to diagnose MRSA infection nor to guide or monitor treatment for MRSA infections. RESULT CALLED TO, READ BACK BY AND VERIFIED WITH: Guido Sander [id number] @ 1450 BY J SCOTTON   Urine culture     Status: None   Collection Time: 12/12/15  3:30 PM  Result Value Ref Range Status   Specimen Description URINE, SUPRAPUBIC  Final   Special Requests NONE  Final   Culture   Final    NO GROWTH 2 DAYS Performed at [facility]    Report Status 12/14/2015 FINAL  Final     Labs: Basic Metabolic Panel:  Recent Labs Lab 12/10/15 2201 12/12/15 0538 12/13/15 0552  NA 139 145 145  K 3.2* 2.8* 3.7  CL 91* 98* 105  CO2 37* 39* 35*  GLUCOSE 159* 86 90  BUN 12 10 8   CREATININE 0.89 0.82 0.79  CALCIUM 9.1 8.8* 8.9  MG  --  2.2  --    Liver Function Tests:  Recent Labs Lab 12/10/15 2201  AST 24  ALT 22  ALKPHOS 68  BILITOT 0.6  PROT 7.4  ALBUMIN 3.7   No results for input(s): LIPASE, AMYLASE in the last 168 hours. No results for input(s): AMMONIA in the last 168 hours. CBC:  Recent  Labs Lab 12/10/15 2201 12/12/15 0538 12/13/15 0552  WBC 6.0 4.8 3.6*  NEUTROABS 4.9  --   --   HGB 10.6* 10.4* 9.7*  HCT 34.9* 34.6* 31.7*  MCV 90.4 92.3 90.6  PLT 214 202 178   Cardiac Enzymes: No results for input(s): CKTOTAL, CKMB, CKMBINDEX, TROPONINI in the last 168 hours. BNP: BNP (last 3 results) No results for input(s): BNP in the last 8760 hours.  ProBNP (last 3 results) No results for input(s): PROBNP in the last 8760 hours.  CBG:  Recent Labs Lab 12/14/15 1648 12/14/15 1939 12/14/15 2004 12/14/15 2147 12/15/15 0826  GLUCAP 150* 41* 76 128* 127*       Signed:  Gracyn Santillanes U Breyah Akhter  [facility] 12/15/2015, 9:43 AM

## 2015-12-15 NOTE — Progress Notes (Signed)
CSW spoke with facility representative [facility] regarding [facility]'s return. Per [facility], they are ready for the patient and would need settings for bipap. CSW informed RN regarding requested information. Once CSW receives call back, CSW will arrange transportation for the patient [facility].   CSW will continue to follow and provide support to patient while in hospital.   Fernande BoydenJoyce Cande Mastropietro, [facility] CenterCSWA Clinical Social Worker [facility] [phone]

## 2015-12-15 NOTE — Care Management Important Message (Signed)
Important Message  Patient DetailsImportant Message  Patient Details IM Letter given to Suzanne/Case Manager to present to Patient Name: Karen BarrioChristy Walls MRN: [id number] Date of Birth: 04/27/1961   Medicare Important Message Given:  Yes    [name], [name] 12/15/2015, 11:58 AM  Name: Karen BarrioChristy Walls MRN: [id number] Date of Birth: 08/12/1961   Medicare Important Message Given:  Yes    [name], [name] 12/15/2015, 11:58 AM

## 2015-12-15 NOTE — Progress Notes (Signed)
Pt will self administer CPAP when ready for bed.  RT to monitor and assess as needed.

## 2015-12-16 ENCOUNTER — Non-Acute Institutional Stay (SKILLED_NURSING_FACILITY): Payer: Medicare Other | Admitting: Internal Medicine

## 2015-12-16 DIAGNOSIS — K560 Paralytic ileus: Secondary | ICD-10-CM

## 2015-12-16 DIAGNOSIS — J9610 Chronic respiratory failure, unspecified whether with hypoxia or hypercapnia: Secondary | ICD-10-CM | POA: Diagnosis not present

## 2015-12-16 DIAGNOSIS — J449 Chronic obstructive pulmonary disease, unspecified: Secondary | ICD-10-CM

## 2015-12-16 LAB — CULTURE, BLOOD (ROUTINE X 2)
CULTURE: NO GROWTH
Culture: NO GROWTH

## 2015-12-16 NOTE — Progress Notes (Addendum)
Patient ID: [name], female   DOB: 01-May-1961, 54 y.o.   MRN: [id number]                PROGRESS NOTE  DATE:  12/09/2015          FACILITY: [facility]                       LEVEL OF CARE:   SNF   Acute Visit                CHIEF COMPLAINT:  Abdominal pain.       HISTORY OF PRESENT ILLNESS:  [name] had two staff members come back to see me today to get me to come down and see her with regards to abdominal pain.    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    When I got down to the room today to see her, she never really even mentioned the abdominal pain.  Her major complaint is that she does not feel she is getting oxygen on a 24-hour basis even though she admits when staff check her O2 sats, it is routinely 95-96% on 2 L.  She feels that at night, she is not breathing properly, that it affects her during the day, etc.    I had this conversation with her last week and we agreed to set her up for a sleep study.  The patient is convinced, I think, that she needs BiPAP at night and p.r.n. during the day like she had in the hospital.    Her other major concern is that of her extreme anxiety.  She feels her Valium has been reduced.  Currently, she is on 10 mg q.h.s. p.r.n.  She has had this multiple times during the day in the past, etc., etc.     PAST MEDICAL HISTORY/PROBLEM LIST: Past Medical History  Diagnosis Date  . Diabetes mellitus without complication (HCC)   . Chronic pain   . Morbid obesity (HCC)   . OSA (obstructive sleep apnea)   . Generalized weakness   . Fibromyalgia   . Lyme disease   . Anemia   . History of blood transfusion     pt states she has had 5 blood transfusions  . Sleep disorder   . UTI (lower urinary tract infection) 02/22/2014  . Hypertension   . Hypothyroidism   . COPD (chronic obstructive pulmonary disease) (HCC)   . Shortness of breath   . GERD (gastroesophageal  reflux disease)      PAST SURGICAL HISTORY:    Past Surgical History  Procedure Laterality Date  . Explaratory      on stomach,   . Appendectomy    . Foot surgery      due to stepping on broken glass  . Spinal tap    . Supra pubic catheter    . Esophagogastroduodenoscopy (egd) with propofol N/A 03/05/2014    Procedure: ESOPHAGOGASTRODUODENOSCOPY (EGD) WITH PROPOFOL;  Surgeon: Willis Modena, MD;  Location: [facility];  Service: Endoscopy;  Laterality: N/A;  . Colonoscopy with propofol N/A 03/05/2014    Procedure: COLONOSCOPY WITH PROPOFOL;  Surgeon: Willis Modena, MD;  Location: [facility];  Service: Endoscopy;  Laterality: N/A;  . Dental restoration/extraction with x-ray    . Colonoscopy with propofol N/A 03/06/2014    Procedure: COLONOSCOPY WITH PROPOFOL;  Surgeon: Willis Modena, MD;  Location: [facility];  Service: Endoscopy;  Laterality: N/A;  CURRENT MEDICATIONS:  Medication list is reviewed.    Wellbutrin XL 300 mg daily.      Synthroid 250 daily.    Prilosec 20 q.d.     Paxil CR 25 mg daily.    K-Dur 10 mEq daily.     Oretic 25 mg daily.    Vitamin D2, 50,000 U every 14 days.    Colace 200 b.i.d.     Neurontin 300 twice a day.    Amitiza 24 b.i.d.      Ditropan 5 b.i.d.     MiraLAX 17 g b.i.d.      Methadone 30 mg three times a day chronically.    Carafate 1 g four times daily.    Desyrel 100 mg q.h.s.      Insulin 70/30, 25 U b.i.d.  (Hemoglobin A1c was 6.6 last month in the hospital.)    HumaLog sliding scale.    Valium listed as 10 mg q.h.s. p.r.n.     REVIEW OF SYSTEMS:   Virtually impossible in this patient due to a multitude of long-winded answers.   However, importantly:   GENERAL:  No fever.    CHEST/RESPIRATORY:  No cough.  No sputum.    CARDIAC:  She has no chest pain.   GI:  States she needs to have a bowel movement currently.      GU:  She has a suprapubic catheter.  Chronic pelvic pain, I think, is one of the reasons for  the methadone as well as a history of fibromyalgia.   PSYCHIATRIC:  Mental status:  She states she is so anxious that she really cannot control herself, and at other times states that they cannot wake her up in physical therapy.    PHYSICAL EXAMINATION:   VITAL SIGNS:     PULSE:  70, and regular.   RESPIRATIONS:  20, and unlabored.   02 SATURATIONS:  95%, as advertised.  Her oxygen is on.   CHEST/RESPIRATORY:  Shallow air entry bilaterally.   No wheezing is noted.   CARDIOVASCULAR:   CARDIAC:  Heart sounds are normal.  She appears to be euvolemic.    GASTROINTESTINAL:   ABDOMEN:  Her abdomen is distended; however, not nearly as badly as I have seen this in the past.  Bowel sounds are intermittently positive.   LIVER/SPLEEN/KIDNEYS:  No liver, no spleen.   HERNIA:  No hernia is obvious.   GENITOURINARY:   BLADDER:  Suprapubic catheter site is stable.   CIRCULATION:   EDEMA/VARICOSITIES:  Extremities:  No edema.  No evidence of a DVT.    ASSESSMENT/PLAN:                    Chronic generalized anxiety +/- depression with anxiety.  I have increased her Valium as requested.    Complaints of low oxygen/suffocating/not on enough oxygen at night.  I have arranged for this patient to have a sleep study.  She tells me she has had three of these before while she lived in [place], although I have never been able to get any results from this medical location.  I think a sleep study is warranted.  She has a habit of not keeping appointments that I have arranged and I am going to order this one and see how she does with this before we move on to something else.  I would not be opposed to doing PFTs on her; after this, consider referring her on to Pulmonology.  Again, she would need  to comply with this.    History of iron deficiency anemia.  She asked me today about considering her for IV iron.  This is a strange thing for her as she really has been opposed to taking iron in any form.  Her last  hemoglobin was 8.5 in the hospital on 11/09/2015.  I followed this up at 9.2 on 11/14/2015 in the facility.  While I have little doubt that this is iron deficiency, the source of this has never been clear and she has been resistant to the notion of iron in any form, either orally or IV.  I would not be opposed to arranging IV iron for her, although she will need to show more compliance with these arranged appointments.    Type 2 diabetes.  On insulin.  Hemoglobin A1nth was stable at 6.6.

## 2015-12-22 NOTE — Progress Notes (Addendum)
Patient ID: [name], female   DOB: 1961/10/13, 54 y.o.   MRN: [id number]                 HISTORY & PHYSICAL  DATE:  12/16/2015              FACILITY: [facility]              LEVEL OF CARE:   SNF   CHIEF COMPLAINT:  Review, status post admission to hospital, 12/10/2015 through 12/15/2015.     HISTORY OF PRESENT ILLNESS:  I had seen [name] on 12/09/2015 with abdominal pain, felt to be secondary to ileus impaction issues.  She subsequently, the next day, vomited several times and insisted on being sent to the hospital where she was noted to have a temperature of 102.6.  This was felt to be secondary to a UTI.  Her urine culture was drawn after antibiotics.  Therefore, her urine culture in the hospital was negative.  Both her blood cultures were negative.  She has a suprapubic catheter that is constantly colonized with resisting gram-negative rods, including Pseudomonas and Klebsiella.  She was just in hospital with a Pseudomonas UTI a month ago.    Other issues in the hospital seem to be hypokalemia with a potassium of 2.8 on 12/12/2015.   This was replaced and she was discharged with a potassium of 3.7.    Her hemoglobin was 9.7 (chronic iron deficiency anemia).    CT scan of the abdomen and pelvis showed no acute process in the abdomen or pelvis.  No evidence of bowel obstruction, uterine fibroids.  She was felt to have mild splenic enlargement.     Other issues include the issue the patient continues bringing up about obesity hypoventilation syndrome.  Indeed, there are records on Care Everywhere from a Dr. Anola Gurney who is Pulmonary with [facility].  At one point, she was put on BiPAP for what was felt to be obesity hypoventilation, mild COPD with a component of asthma.  According to his notes from 2011, her sleep study did not show significant sleep apnea.  I have reviewed a sleep study on her from September 04, 2008.  Her sleep efficiency was 32%.  Her apnea/hypopnea index was 2.3  per hour.  There was not significant oxyhemoglobin desaturation.  There are notes from a Dr. Ivar Drape from 01/30/2010 that state that she has asthma and mild COPD.  At that point she was on BiPAP, but I am not really certain what justified this.  Screening spirometry at the time showed an FVC of 2.76 L, FEV1 at 2.09, 71% of predicted, with an FEV1 to FVC ratio of 71%.    PAST MEDICAL HISTORY/PROBLEM LIST:    Past Medical History  Diagnosis Date  . Diabetes mellitus without complication (HCC)   . Chronic pain   . Morbid obesity (HCC)   . OSA (obstructive sleep apnea)   . Generalized weakness   . Fibromyalgia   . Lyme disease   . Anemia   . History of blood transfusion     pt states she has had 5 blood transfusions  . Sleep disorder   . UTI (lower urinary tract infection) 02/22/2014  . Hypertension   . Hypothyroidism   . COPD (chronic obstructive pulmonary disease) (HCC)   . Shortness of breath   . GERD (gastroesophageal reflux disease)     PAST SURGICAL HISTORY:   Past Surgical History  Procedure Laterality Date  . Explaratory  on stomach,   . Appendectomy    . Foot surgery      due to stepping on broken glass  . Spinal tap    . Supra pubic catheter    . Esophagogastroduodenoscopy (egd) with propofol N/A 03/05/2014    Procedure: ESOPHAGOGASTRODUODENOSCOPY (EGD) WITH PROPOFOL;  Surgeon: Willis ModenaWilliam Outlaw, MD;  Location: [facility];  Service: Endoscopy;  Laterality: N/A;  . Colonoscopy with propofol N/A 03/05/2014    Procedure: COLONOSCOPY WITH PROPOFOL;  Surgeon: Willis ModenaWilliam Outlaw, MD;  Location: [facility];  Service: Endoscopy;  Laterality: N/A;  . Dental restoration/extraction with x-ray    . Colonoscopy with propofol N/A 03/06/2014    Procedure: COLONOSCOPY WITH PROPOFOL;  Surgeon: Willis ModenaWilliam Outlaw, MD;  Location: [facility];  Service: Endoscopy;  Laterality: N/A;       CURRENT MEDICATIONS:  Medication list is reviewed.    REVIEW OF SYSTEMS:  Almost impossible in this  woman.  However: CHEST/RESPIRATORY:  States that she is not getting enough air.   CARDIAC:  No chest pain.    GI:  States she needs a normal diet and Dulcolax suppositories.     MUSCULOSKELETAL:  She is complaining of widespread musculoskeletal pain.      PHYSICAL EXAMINATION:   VITAL SIGNS:     PULSE:  69.   RESPIRATIONS:  18, and unlabored.   02 SATURATIONS:  95% on her chronic 3 L.   GENERAL APPEARANCE:  The patient looks the same.     CHEST/RESPIRATORY:  Shallow, but otherwise clear air entry with no wheezing.       CARDIOVASCULAR:   CARDIAC:  Heart sounds are normal.  JVP is not elevated.      GASTROINTESTINAL:   ABDOMEN:  Very distended.  Bowel sounds, again, intermittently positive in rushes.   GENITOURINARY:   BLADDER:  Suprapubic site looks clean.   MUSCULOSKELETAL:    No muscle wasting is noted.   EXTREMITIES:   BILATERAL UPPER EXTREMITIES:   She does have digital clubbing in her hands.   CIRCULATION:   EDEMA/VARICOSITIES:  There is no edema.    ASSESSMENT/PLAN:                       Whether she really had a UTI in the hospital, at this point I am unclear.  She was vomiting.  She could have had a viral illness, as well.   Her culture was negative but, as was stated, she apparently had antibiotics prior to this.  Her blood cultures were negative, as well.    Chronic constipation/ileus.  She is on Amitiza, MiraLAX.  I will leave her Dulcolax suppositories and Fleet enemas p.r.n.    I have reviewed some of the issues in Care Everywhere.  I am not completely certain how they were justifying her BiPAP at the time.  She did have mild obstructive disease as well as an asthmatic component and chronic hypoxemia.  I am assuming that her clubbing relates to this.  Nowhere in here it does say that she does not have significant sleep apnea, which is surprising.  They used BiPAP on her at night.    The patient seems to be stable and back to her usual issues.    I had asked for a sleep  study at [facility].  I would still like to go ahead with this.  I have my doubts that she would actually comply with CPAP or BiPAP.  However, right now she is saying that  she would. If she goes ahead with the sleep study then PFT's wound be the next step. Perhaps reconnecting her with Pulmonary locally. She is not always coompliant with these arrangements.

## 2016-01-01 ENCOUNTER — Other Ambulatory Visit: Payer: Self-pay | Admitting: *Deleted

## 2016-01-01 MED ORDER — METHADONE HCL 10 MG PO TABS: ORAL_TABLET | ORAL | Status: DC

## 2016-01-01 NOTE — Telephone Encounter (Signed)
[facility] 

## 2016-01-19 ENCOUNTER — Encounter: Payer: Self-pay | Admitting: Internal Medicine

## 2016-01-19 ENCOUNTER — Non-Acute Institutional Stay (SKILLED_NURSING_FACILITY): Payer: Medicare Other | Admitting: Internal Medicine

## 2016-01-19 DIAGNOSIS — J449 Chronic obstructive pulmonary disease, unspecified: Secondary | ICD-10-CM | POA: Diagnosis not present

## 2016-01-19 DIAGNOSIS — F329 Major depressive disorder, single episode, unspecified: Secondary | ICD-10-CM | POA: Diagnosis not present

## 2016-01-19 DIAGNOSIS — N319 Neuromuscular dysfunction of bladder, unspecified: Secondary | ICD-10-CM | POA: Diagnosis not present

## 2016-01-19 DIAGNOSIS — I10 Essential (primary) hypertension: Secondary | ICD-10-CM | POA: Diagnosis not present

## 2016-01-19 DIAGNOSIS — E662 Morbid (severe) obesity with alveolar hypoventilation: Secondary | ICD-10-CM | POA: Diagnosis not present

## 2016-01-19 DIAGNOSIS — N3010 Interstitial cystitis (chronic) without hematuria: Secondary | ICD-10-CM | POA: Diagnosis not present

## 2016-01-19 DIAGNOSIS — G4733 Obstructive sleep apnea (adult) (pediatric): Secondary | ICD-10-CM | POA: Diagnosis not present

## 2016-01-19 DIAGNOSIS — E1151 Type 2 diabetes mellitus with diabetic peripheral angiopathy without gangrene: Secondary | ICD-10-CM | POA: Diagnosis not present

## 2016-01-19 DIAGNOSIS — D638 Anemia in other chronic diseases classified elsewhere: Secondary | ICD-10-CM

## 2016-01-19 DIAGNOSIS — G894 Chronic pain syndrome: Secondary | ICD-10-CM

## 2016-01-19 DIAGNOSIS — F32A Depression, unspecified: Secondary | ICD-10-CM
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
## 2016-01-22 ENCOUNTER — Other Ambulatory Visit: Payer: Self-pay

## 2016-01-22 MED ORDER — METHADONE HCL 10 MG PO TABS: ORAL_TABLET | ORAL | Status: DC

## 2016-01-26 ENCOUNTER — Encounter: Payer: Self-pay | Admitting: Internal Medicine

## 2016-01-26 DIAGNOSIS — N319 Neuromuscular dysfunction of bladder, unspecified: Secondary | ICD-10-CM | POA: Insufficient documentation

## 2016-01-26 DIAGNOSIS — D638 Anemia in other chronic diseases classified elsewhere: Secondary | ICD-10-CM | POA: Insufficient documentation

## 2016-01-26 DIAGNOSIS — E119 Type 2 diabetes mellitus without complications: Secondary | ICD-10-CM | POA: Insufficient documentation

## 2016-01-26 DIAGNOSIS — D8989 Other specified disorders involving the immune mechanism, not elsewhere classified: Secondary | ICD-10-CM | POA: Insufficient documentation

## 2016-01-26 DIAGNOSIS — G9332 Myalgic encephalomyelitis/chronic fatigue syndrome: Secondary | ICD-10-CM | POA: Insufficient documentation

## 2016-01-26 DIAGNOSIS — K219 Gastro-esophageal reflux disease without esophagitis: Secondary | ICD-10-CM | POA: Insufficient documentation

## 2016-01-26 DIAGNOSIS — E785 Hyperlipidemia, unspecified: Secondary | ICD-10-CM | POA: Insufficient documentation

## 2016-01-26 DIAGNOSIS — N3010 Interstitial cystitis (chronic) without hematuria: Secondary | ICD-10-CM | POA: Insufficient documentation

## 2016-01-26 DIAGNOSIS — G4733 Obstructive sleep apnea (adult) (pediatric): Secondary | ICD-10-CM | POA: Insufficient documentation

## 2016-01-26 DIAGNOSIS — R5382 Chronic fatigue, unspecified: Secondary | ICD-10-CM

## 2016-02-02 ENCOUNTER — Non-Acute Institutional Stay (SKILLED_NURSING_FACILITY): Payer: Medicare Other | Admitting: Internal Medicine

## 2016-02-02 ENCOUNTER — Other Ambulatory Visit: Payer: Self-pay

## 2016-02-02 ENCOUNTER — Encounter: Payer: Self-pay | Admitting: Internal Medicine

## 2016-02-02 DIAGNOSIS — K2100 Gastro-esophageal reflux disease with esophagitis, without bleeding: Secondary | ICD-10-CM

## 2016-02-02 DIAGNOSIS — E662 Morbid (severe) obesity with alveolar hypoventilation: Secondary | ICD-10-CM | POA: Diagnosis not present

## 2016-02-02 DIAGNOSIS — N3010 Interstitial cystitis (chronic) without hematuria: Secondary | ICD-10-CM | POA: Diagnosis not present

## 2016-02-02 DIAGNOSIS — F32A Depression, unspecified: Secondary | ICD-10-CM

## 2016-02-02 DIAGNOSIS — F329 Major depressive disorder, single episode, unspecified: Secondary | ICD-10-CM

## 2016-02-02 DIAGNOSIS — N390 Urinary tract infection, site not specified: Secondary | ICD-10-CM

## 2016-02-02 DIAGNOSIS — J029 Acute pharyngitis, unspecified: Secondary | ICD-10-CM | POA: Diagnosis not present

## 2016-02-02 DIAGNOSIS — I10 Essential (primary) hypertension: Secondary | ICD-10-CM | POA: Diagnosis not present

## 2016-02-02 DIAGNOSIS — D638 Anemia in other chronic diseases classified elsewhere: Secondary | ICD-10-CM

## 2016-02-02 DIAGNOSIS — E785 Hyperlipidemia, unspecified: Secondary | ICD-10-CM

## 2016-02-02 DIAGNOSIS — E1151 Type 2 diabetes mellitus with diabetic peripheral angiopathy without gangrene: Secondary | ICD-10-CM | POA: Diagnosis not present

## 2016-02-02 DIAGNOSIS — E039 Hypothyroidism, unspecified: Secondary | ICD-10-CM | POA: Diagnosis not present

## 2016-02-02 DIAGNOSIS — G894 Chronic pain syndrome: Secondary | ICD-10-CM

## 2016-02-02 DIAGNOSIS — R5381 Other malaise: Secondary | ICD-10-CM

## 2016-02-02 MED ORDER — OXYCODONE HCL 10 MG PO TABS: ORAL_TABLET | ORAL | Status: AC

## 2016-02-02 NOTE — Telephone Encounter (Signed)
Rx faxed to [facility] @ [phone], phone number [phone]  
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
## 2016-02-09 DIAGNOSIS — J029 Acute pharyngitis, unspecified: Secondary | ICD-10-CM | POA: Insufficient documentation

## 2016-02-09 DIAGNOSIS — R5381 Other malaise: Secondary | ICD-10-CM | POA: Insufficient documentation

## 2016-02-13 ENCOUNTER — Other Ambulatory Visit: Payer: Self-pay | Admitting: *Deleted

## 2016-02-13 MED ORDER — METHADONE HCL 10 MG PO TABS: ORAL_TABLET | ORAL | Status: DC

## 2016-02-13 NOTE — Telephone Encounter (Signed)
[facility] 

## 2016-02-18 ENCOUNTER — Ambulatory Visit (HOSPITAL_BASED_OUTPATIENT_CLINIC_OR_DEPARTMENT_OTHER): Payer: Medicare Other | Attending: Internal Medicine | Admitting: Radiology

## 2016-02-18 VITALS — Ht 65.0 in | Wt 297.0 lb

## 2016-02-18 DIAGNOSIS — R0683 Snoring: Secondary | ICD-10-CM | POA: Insufficient documentation

## 2016-02-18 DIAGNOSIS — G4710 Hypersomnia, unspecified: Secondary | ICD-10-CM

## 2016-02-18 DIAGNOSIS — G4719 Other hypersomnia: Secondary | ICD-10-CM | POA: Insufficient documentation

## 2016-02-18 DIAGNOSIS — J449 Chronic obstructive pulmonary disease, unspecified: Secondary | ICD-10-CM | POA: Diagnosis not present

## 2016-02-18 DIAGNOSIS — Z79899 Other long term (current) drug therapy: Secondary | ICD-10-CM | POA: Insufficient documentation

## 2016-02-18 DIAGNOSIS — R5383 Other fatigue: Secondary | ICD-10-CM | POA: Insufficient documentation

## 2016-02-18 DIAGNOSIS — Z6841 Body Mass Index (BMI) 40.0 and over, adult: Secondary | ICD-10-CM | POA: Insufficient documentation

## 2016-02-18 DIAGNOSIS — E669 Obesity, unspecified: Secondary | ICD-10-CM | POA: Diagnosis not present

## 2016-02-18 DIAGNOSIS — E119 Type 2 diabetes mellitus without complications: Secondary | ICD-10-CM | POA: Insufficient documentation

## 2016-02-18 DIAGNOSIS — I10 Essential (primary) hypertension: Secondary | ICD-10-CM | POA: Insufficient documentation

## 2016-02-18 DIAGNOSIS — G4733 Obstructive sleep apnea (adult) (pediatric): Secondary | ICD-10-CM | POA: Insufficient documentation

## 2016-02-22 DIAGNOSIS — R0683 Snoring: Secondary | ICD-10-CM | POA: Diagnosis not present

## 2016-02-22 DIAGNOSIS — G4710 Hypersomnia, unspecified: Secondary | ICD-10-CM | POA: Diagnosis not present

## 2016-02-22 NOTE — Progress Notes (Signed)
   Patient Name: [name], [name] Date: 02/18/2016 Gender: Female D.O.B: 1961-12-26 Age (years): 54 Referring Provider: Joseph Art Height (inches): 55 Interpreting Physician: Jetty Duhamel MD, ABSM Weight (lbs): 297 RPSGT: Shelah Lewandowsky BMI: 69 MRN: [id number] Neck Size: 18.50 CLINICAL INFORMATION Sleep Study Type: NPSG   Indication for sleep study: COPD, Diabetes, Excessive Daytime Sleepiness, Fatigue, Hypertension, Obesity, OSA, Re-Evaluation, Snoring, Witnessed Apneas   Epworth Sleepiness Score: "Patient unable to answer questions coherently for the Epworth Score"   SLEEP STUDY TECHNIQUE As per the AASM Manual for the Scoring of Sleep and Associated Events v2.3 (April 2016) with a hypopnea requiring 4% desaturations. The channels recorded and monitored were frontal, central and occipital EEG, electrooculogram (EOG), submentalis EMG (chin), nasal and oral airflow, thoracic and abdominal wall motion, anterior tibialis EMG, snore microphone, electrocardiogram, and pulse oximetry.  MEDICATIONS [name] medications include: charted for review Medications self-administered by patient during sleep study : methadone,valium, gabapentin, sucralfate, nitrofurantoin  SLEEP ARCHITECTURE The study was initiated at 11:30:37 PM and ended at 5:35:25 AM. Sleep onset time was 86.9 minutes and the sleep efficiency was 74.7%. The total sleep time was 272.5 minutes. Stage REM latency was 169.5 minutes. The patient spent 2.75% of the night in stage N1 sleep, 80.18% in stage N2 sleep, 1.65% in stage N3 and 15.41% in REM. Alpha intrusion was absent. Supine sleep was 11.17%.  RESPIRATORY PARAMETERS The overall apnea/hypopnea index (AHI) was 5.5 per hour. There were 25 total apneas, including 6 obstructive, 19 central and 0 mixed apneas. There were 0 hypopneas and 1 RERAs. The AHI during Stage REM sleep was 4.3 per hour. AHI while supine was 2.0 per hour. The mean oxygen saturation  was 95.31%. The minimum SpO2 during sleep was 92.00%. Study done on 4L supplemental O2 as ordered. Moderate snoring was noted during this study.  CARDIAC DATA The 2 lead EKG demonstrated sinus rhythm. The mean heart rate was 63.75 beats per minute. Other EKG findings include: None.  LEG MOVEMENT DATA The total PLMS were 23 with a resulting PLMS index of 5.06. Associated arousal with leg movement index was 0.4 .  IMPRESSIONS - Mild obstructive sleep apnea occurred during this study (AHI = 5.5/h). - No significant central sleep apnea occurred during this study (CAI = 4.2/h). - The patient had minimal or no oxygen desaturation during the study (Min O2 = 92.00%) while wearing O2 4L - The patient snored with Moderate snoring volume. - No cardiac abnormalities were noted during this study. - Mild periodic limb movements of sleep occurred during the study. No significant associated arousals. - Difficulty initiating sleep- patient ate and read prior to sleep onset at 1:14 AM.  DIAGNOSIS - Obstructive Sleep Apnea (327.23 [G47.33 ICD-10])  RECOMMENDATIONS - Very mild obstructive sleep apnea. Return to discuss treatment options. - Avoid alcohol, sedatives and other CNS depressants that may worsen sleep apnea and disrupt normal sleep architecture. - Sleep hygiene should be reviewed to assess factors that may improve sleep quality. - Weight management and regular exercise should be initiated or continued if appropriate.  Waymon Budge Diplomate, American Board of Sleep Medicine  ELECTRONICALLY SIGNED ON:  02/22/2016, 9:34 AM [facility] PH: [phone]   FX: [phone] ACCREDITED BY THE AMERICAN ACADEMY OF SLEEP MEDICINE

## 2016-03-04 ENCOUNTER — Other Ambulatory Visit: Payer: Self-pay

## 2016-03-04 MED ORDER — METHADONE HCL 10 MG PO TABS: ORAL_TABLET | ORAL | Status: DC

## 2016-03-04 NOTE — Telephone Encounter (Signed)
Rx faxed to [facility] @ [phone], phone number [phone]  

## 2016-03-08 ENCOUNTER — Non-Acute Institutional Stay (SKILLED_NURSING_FACILITY): Payer: Medicare Other | Admitting: Internal Medicine

## 2016-03-08 DIAGNOSIS — F329 Major depressive disorder, single episode, unspecified: Secondary | ICD-10-CM | POA: Diagnosis not present

## 2016-03-08 DIAGNOSIS — R5381 Other malaise: Secondary | ICD-10-CM | POA: Diagnosis not present

## 2016-03-08 DIAGNOSIS — N319 Neuromuscular dysfunction of bladder, unspecified: Secondary | ICD-10-CM | POA: Diagnosis not present

## 2016-03-08 DIAGNOSIS — F32A Depression, unspecified: Secondary | ICD-10-CM

## 2016-03-08 DIAGNOSIS — G894 Chronic pain syndrome: Secondary | ICD-10-CM | POA: Diagnosis not present
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
## 2016-04-06 IMAGING — CT CT ABD-PELV W/ CM
2 of 5 series · 17 of 46 positions shown, 19 images · IV contrast (OMNIPAQUE 300)
Comparison: 11/07/2015

CLINICAL DATA: Vomiting. Generalized abdominal pain. Patient was
medications.

EXAM:
CT ABDOMEN AND PELVIS WITH CONTRAST
TECHNIQUE: Multidetector CT imaging of the abdomen and pelvis was performed
using the standard protocol following bolus administration of
intravenous contrast.
CONTRAST:  100mL OMNIPAQUE IOHEXOL 300 MG/ML  SOLN

[Series 2: abd/pel with · axial · 0.98mm/px · z∈[-1048,-628]mm · 14 of 96 slices shown, 16 images]
[im 6/96  soft-tissue]
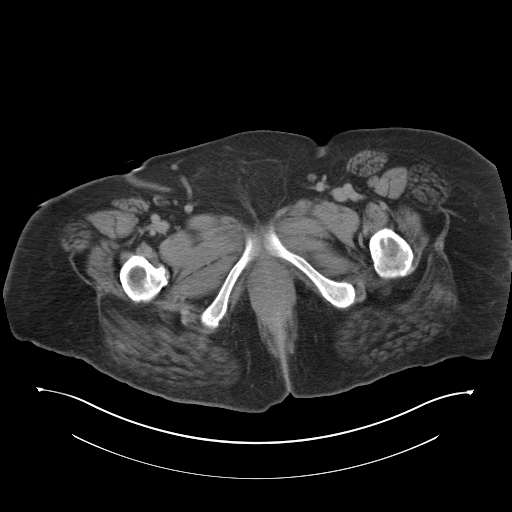
[im 6/96  bone]
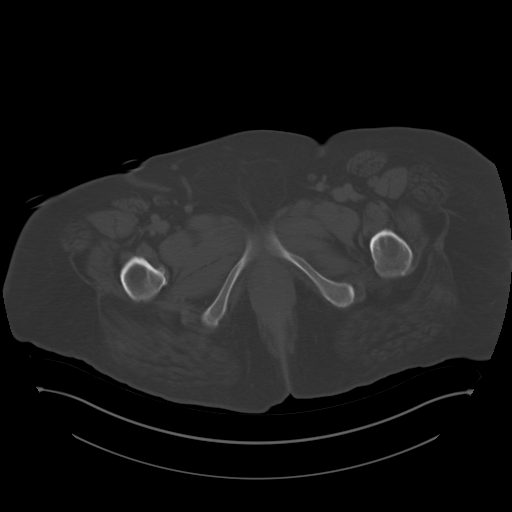
[im 11/96  soft-tissue]
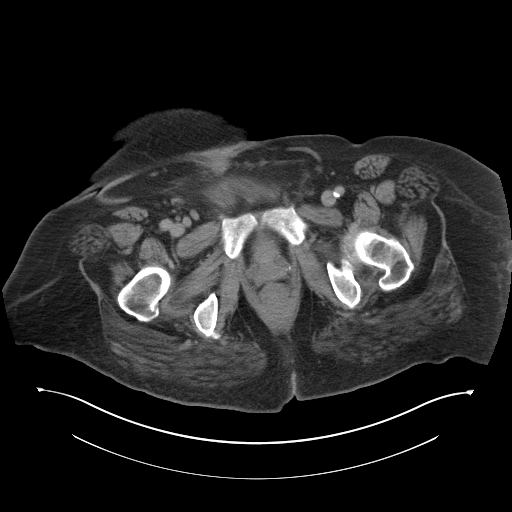
[im 22/96  soft-tissue]
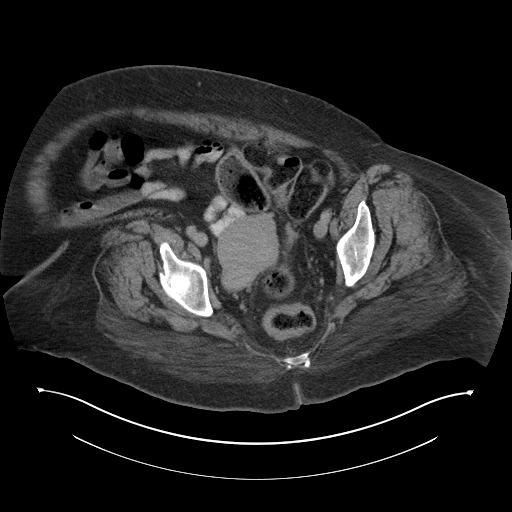
[im 27/96  soft-tissue]
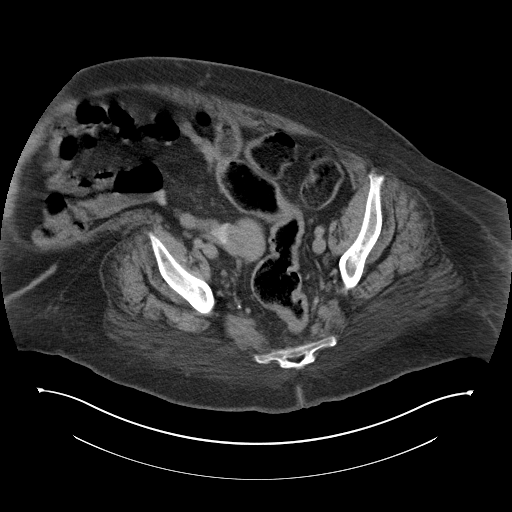
[im 32/96  soft-tissue]
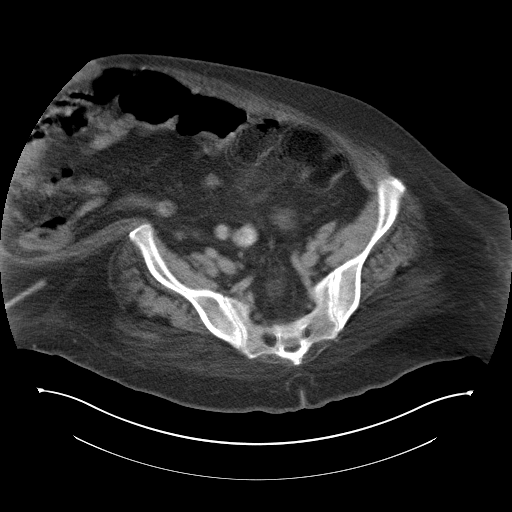
[im 37/96  soft-tissue]
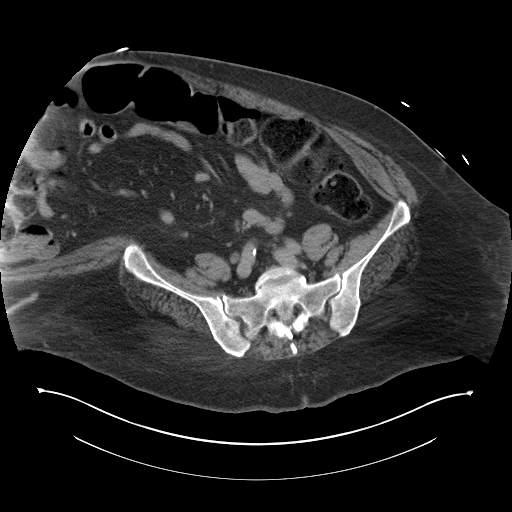
[im 43/96  soft-tissue]
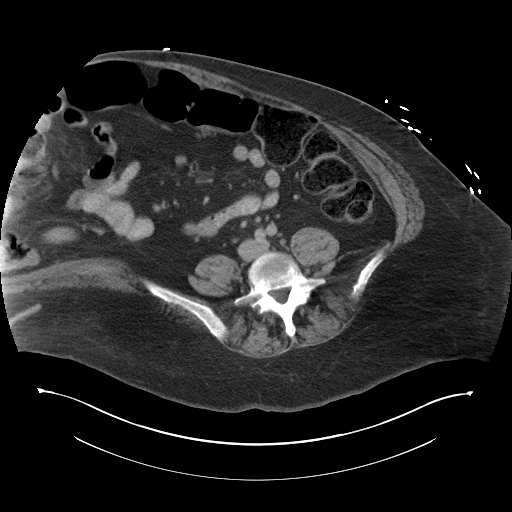
[im 53/96  soft-tissue]
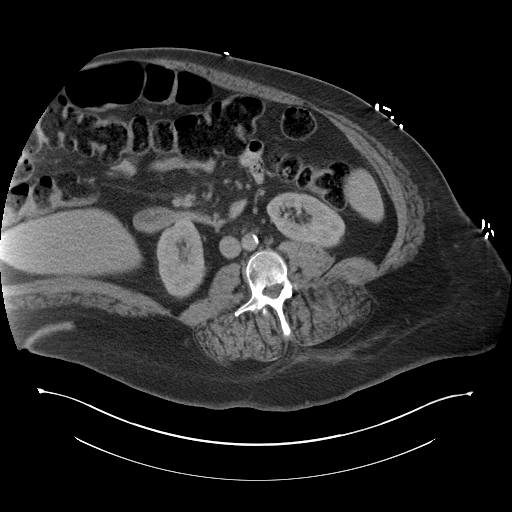
[im 59/96  soft-tissue]
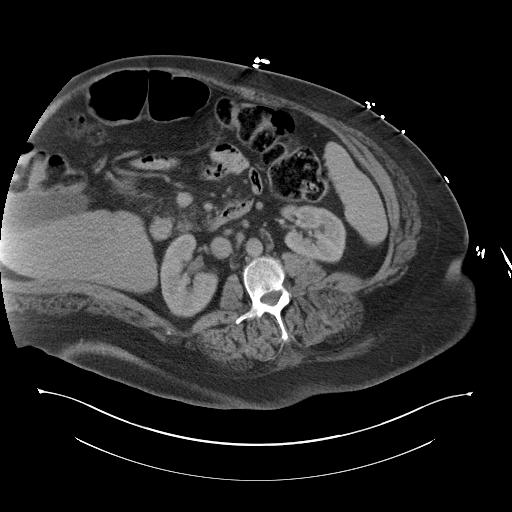
[im 59/96  bone]
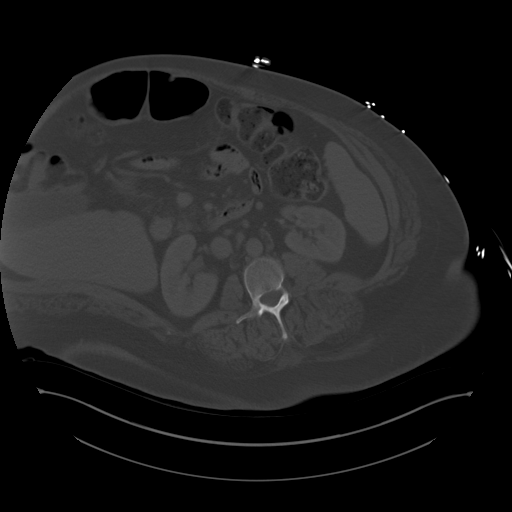
[im 64/96  soft-tissue]
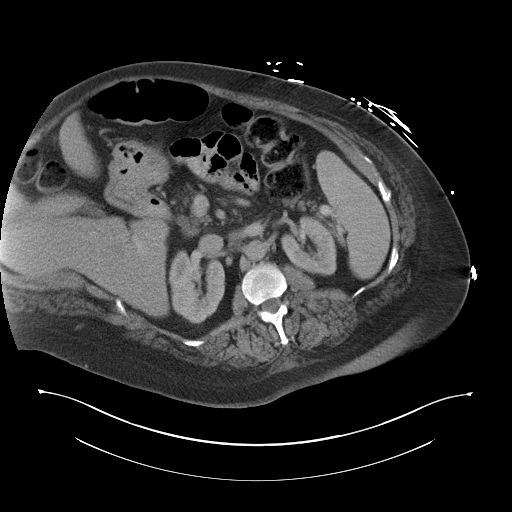
[im 69/96  soft-tissue]
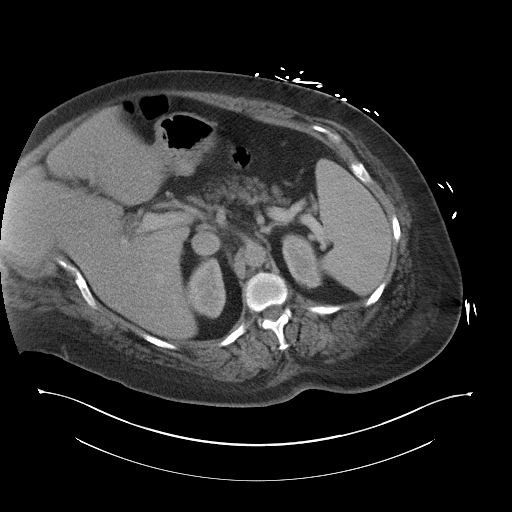
[im 74/96  soft-tissue]
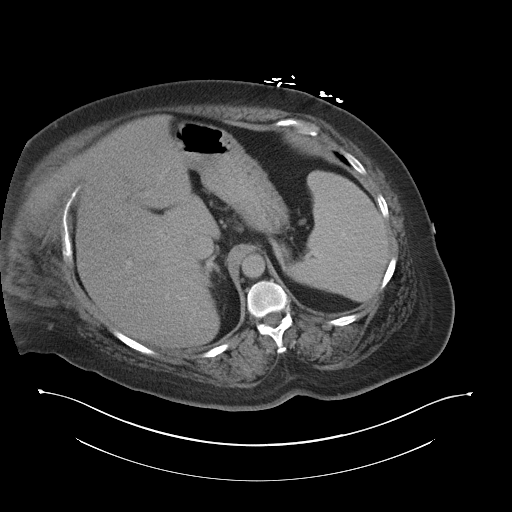
[im 85/96  soft-tissue]
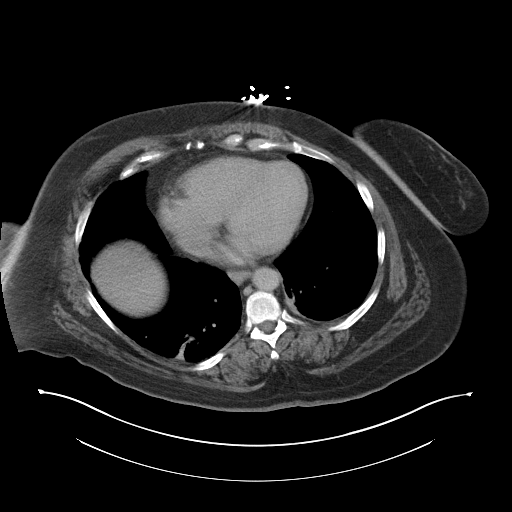
[im 90/96  soft-tissue]
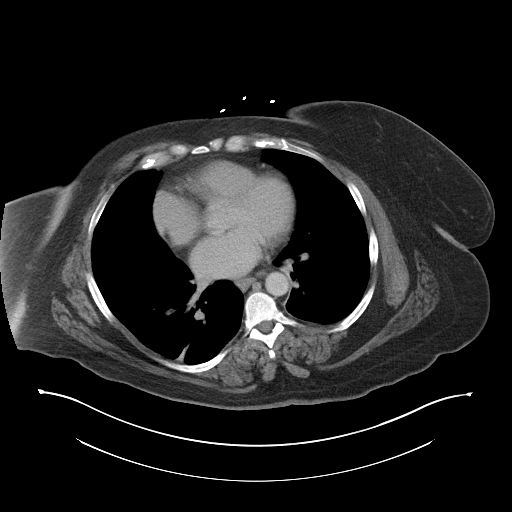

[Series 4: coronal a/|p · coronal · 0.94mm/px · 3 of 125 slices shown]
[im 42/125  soft-tissue]
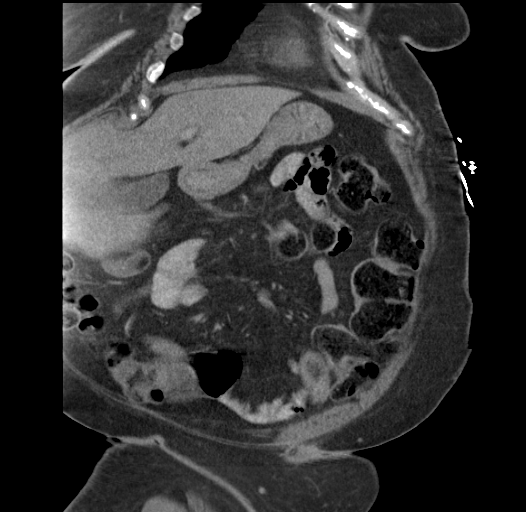
[im 56/125  soft-tissue]
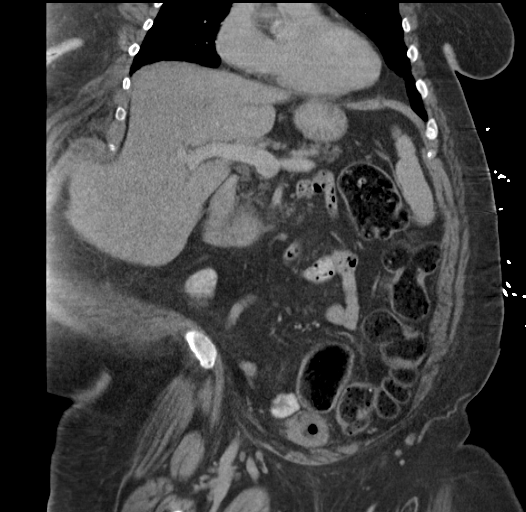
[im 69/125  soft-tissue]
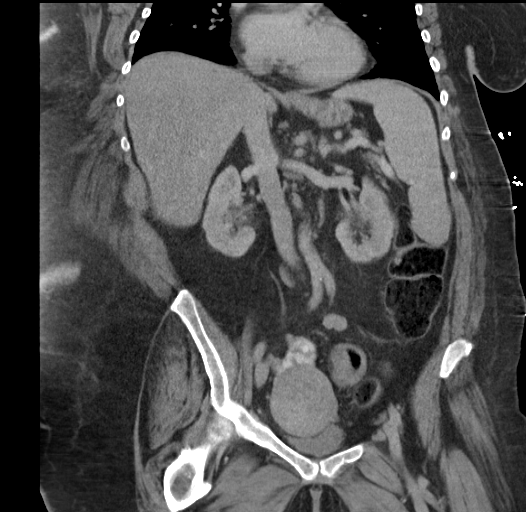

[17 of 46 positions shown; findings below may reference images not displayed]

FINDINGS: Linear atelectasis in the right lung base.

There is laxity of the anterior abdominal wall with portions of the
anterior and right lateral abdomen not included within the field of
view of this study. The spleen is mildly enlarged but otherwise
homogeneous. The liver, gallbladder, pancreas, adrenal glands,
kidneys, abdominal aorta, inferior vena cava, and retroperitoneal
lymph nodes are unremarkable. Visualized stomach, small bowel, and
colon are not abnormally distended. No free air or free fluid
demonstrated in the visualized abdomen.

Pelvis: Uterus and ovaries are not enlarged. Nodular appearance to
the uterus is consistent with uterine fibroids. Suprapubic catheter
deflects the bladder. No free or loculated pelvic fluid collections.
No pelvic mass or lymphadenopathy. The rectosigmoid colon is
decompressed but there appears to be thickening of the wall of the
rectosigmoid colon which may indicate proctocolitis. This appearance
is similar to previous study.
IMPRESSION: No acute process demonstrated in the abdomen or pelvis. No evidence
of bowel obstruction. Nonspecific thickening of the wall of the
rectosigmoid colon may represent proctocolitis but is unchanged
since prior study. Uterine fibroids. Suprapubic catheter. Mild
splenic enlargement.

## 2016-09-09 ENCOUNTER — Encounter (HOSPITAL_COMMUNITY): Payer: Self-pay | Admitting: Emergency Medicine

## 2016-09-09 ENCOUNTER — Observation Stay (HOSPITAL_COMMUNITY)
Admission: EM | Admit: 2016-09-09 | Discharge: 2016-09-10 | Disposition: A | Discharge status: Home / Self Care | Payer: Medicare Other | Attending: Family Medicine | Admitting: Family Medicine

## 2016-09-09 ENCOUNTER — Observation Stay (HOSPITAL_COMMUNITY): Payer: Medicare Other

## 2016-09-09 DIAGNOSIS — F419 Anxiety disorder, unspecified: Secondary | ICD-10-CM | POA: Diagnosis not present

## 2016-09-09 DIAGNOSIS — Z9981 Dependence on supplemental oxygen: Secondary | ICD-10-CM | POA: Diagnosis not present

## 2016-09-09 DIAGNOSIS — E1159 Type 2 diabetes mellitus with other circulatory complications: Secondary | ICD-10-CM | POA: Diagnosis not present

## 2016-09-09 DIAGNOSIS — N10 Acute tubulo-interstitial nephritis: Secondary | ICD-10-CM

## 2016-09-09 DIAGNOSIS — I10 Essential (primary) hypertension: Secondary | ICD-10-CM | POA: Diagnosis not present

## 2016-09-09 DIAGNOSIS — Z87440 Personal history of urinary (tract) infections: Secondary | ICD-10-CM | POA: Insufficient documentation

## 2016-09-09 DIAGNOSIS — Z79899 Other long term (current) drug therapy: Secondary | ICD-10-CM | POA: Diagnosis not present

## 2016-09-09 DIAGNOSIS — E662 Morbid (severe) obesity with alveolar hypoventilation: Secondary | ICD-10-CM | POA: Insufficient documentation

## 2016-09-09 DIAGNOSIS — Z6841 Body Mass Index (BMI) 40.0 and over, adult: Secondary | ICD-10-CM | POA: Diagnosis not present

## 2016-09-09 DIAGNOSIS — K219 Gastro-esophageal reflux disease without esophagitis: Secondary | ICD-10-CM | POA: Diagnosis not present

## 2016-09-09 DIAGNOSIS — E039 Hypothyroidism, unspecified: Secondary | ICD-10-CM | POA: Insufficient documentation

## 2016-09-09 DIAGNOSIS — M797 Fibromyalgia: Secondary | ICD-10-CM | POA: Diagnosis not present

## 2016-09-09 DIAGNOSIS — G2581 Restless legs syndrome: Secondary | ICD-10-CM | POA: Insufficient documentation

## 2016-09-09 DIAGNOSIS — G894 Chronic pain syndrome: Principal | ICD-10-CM | POA: Insufficient documentation

## 2016-09-09 DIAGNOSIS — N23 Unspecified renal colic: Secondary | ICD-10-CM | POA: Insufficient documentation

## 2016-09-09 DIAGNOSIS — Z794 Long term (current) use of insulin: Secondary | ICD-10-CM | POA: Diagnosis not present

## 2016-09-09 DIAGNOSIS — E1165 Type 2 diabetes mellitus with hyperglycemia: Secondary | ICD-10-CM | POA: Diagnosis not present

## 2016-09-09 DIAGNOSIS — Z993 Dependence on wheelchair: Secondary | ICD-10-CM | POA: Insufficient documentation

## 2016-09-09 DIAGNOSIS — N12 Tubulo-interstitial nephritis, not specified as acute or chronic: Secondary | ICD-10-CM | POA: Insufficient documentation

## 2016-09-09 DIAGNOSIS — N3010 Interstitial cystitis (chronic) without hematuria: Secondary | ICD-10-CM | POA: Insufficient documentation

## 2016-09-09 DIAGNOSIS — R000 Tachycardia, unspecified: Secondary | ICD-10-CM | POA: Diagnosis not present

## 2016-09-09 DIAGNOSIS — F329 Major depressive disorder, single episode, unspecified: Secondary | ICD-10-CM | POA: Diagnosis not present

## 2016-09-09 DIAGNOSIS — Z79891 Long term (current) use of opiate analgesic: Secondary | ICD-10-CM | POA: Diagnosis not present

## 2016-09-09 DIAGNOSIS — Z66 Do not resuscitate: Secondary | ICD-10-CM | POA: Insufficient documentation

## 2016-09-09 DIAGNOSIS — J449 Chronic obstructive pulmonary disease, unspecified: Secondary | ICD-10-CM | POA: Insufficient documentation

## 2016-09-09 DIAGNOSIS — Z87891 Personal history of nicotine dependence: Secondary | ICD-10-CM | POA: Insufficient documentation

## 2016-09-09 LAB — GLUCOSE, CAPILLARY
GLUCOSE-CAPILLARY: 388 mg/dL — AB (ref 65–99)
GLUCOSE-CAPILLARY: 421 mg/dL — AB (ref 65–99)
GLUCOSE-CAPILLARY: 323 mg/dL — AB (ref 65–99)
Glucose-Capillary: 382 mg/dL — ABNORMAL HIGH (ref 65–99)

## 2016-09-09 LAB — COMPREHENSIVE METABOLIC PANEL
ALBUMIN: 4.2 g/dL (ref 3.5–5.0)
ALK PHOS: 87 U/L (ref 38–126)
ALT: 16 U/L (ref 14–54)
ANION GAP: 9 (ref 5–15)
AST: 13 U/L — ABNORMAL LOW (ref 15–41)
BUN: 16 mg/dL (ref 6–20)
CHLORIDE: 96 mmol/L — AB (ref 101–111)
CO2: 29 mmol/L (ref 22–32)
Calcium: 9.2 mg/dL (ref 8.9–10.3)
Creatinine, Ser: 0.94 mg/dL (ref 0.44–1.00)
GFR calc non Af Amer: >60 mL/min (ref >60)
Glucose, Bld: 433 mg/dL — ABNORMAL HIGH (ref 65–99)
Potassium: 4 mmol/L (ref 3.5–5.1)
SODIUM: 134 mmol/L — AB (ref 135–145)
Total Bilirubin: 0.5 mg/dL (ref 0.3–1.2)
Total Protein: 7.7 g/dL (ref 6.5–8.1)

## 2016-09-09 LAB — CBC WITH DIFFERENTIAL/PLATELET
BASOS PCT: 0 %
Basophils Absolute: 0 10*3/uL (ref 0.0–0.1)
EOS ABS: 0.2 10*3/uL (ref 0.0–0.7)
EOS PCT: 2 %
HCT: 38.9 % (ref 36.0–46.0)
HEMOGLOBIN: 12.3 g/dL (ref 12.0–15.0)
Lymphocytes Relative: 22 %
Lymphs Abs: 2 10*3/uL (ref 0.7–4.0)
MCH: 27.4 pg (ref 26.0–34.0)
MCHC: 31.6 g/dL (ref 30.0–36.0)
MCV: 86.6 fL (ref 78.0–100.0)
Monocytes Absolute: 0.8 10*3/uL (ref 0.1–1.0)
Monocytes Relative: 8 %
NEUTROS PCT: 68 %
Neutro Abs: 6.5 10*3/uL (ref 1.7–7.7)
PLATELETS: 216 10*3/uL (ref 150–400)
RBC: 4.49 MIL/uL (ref 3.87–5.11)
RDW: 14.8 % (ref 11.5–15.5)
WBC: 9.5 10*3/uL (ref 4.0–10.5)

## 2016-09-09 LAB — URINALYSIS, ROUTINE W REFLEX MICROSCOPIC
BILIRUBIN URINE: NEGATIVE
Glucose, UA: >1000 mg/dL — AB
Hgb urine dipstick: NEGATIVE
KETONES UR: NEGATIVE mg/dL
NITRITE: POSITIVE — AB
PH: 5.5 (ref 5.0–8.0)
Protein, ur: NEGATIVE mg/dL
Specific Gravity, Urine: 1.041 — ABNORMAL HIGH (ref 1.005–1.030)

## 2016-09-09 LAB — URINE MICROSCOPIC-ADD ON
RBC / HPF: NONE SEEN RBC/hpf (ref 0–5)
Squamous Epithelial / LPF: NONE SEEN

## 2016-09-09 LAB — MRSA PCR SCREENING: MRSA BY PCR: POSITIVE — AB

## 2016-09-09 MED ORDER — MOMETASONE FURO-FORMOTEROL FUM 200-5 MCG/ACT IN AERO
2.0000 | INHALATION_SPRAY | Freq: Two times a day (BID) | RESPIRATORY_TRACT | Status: DC
  Administered 2016-09-09 – 2016-09-10 (×2): 2 via RESPIRATORY_TRACT
  Filled 2016-09-09: qty 8.8

## 2016-09-09 MED ORDER — GABAPENTIN 300 MG PO CAPS
300.0000 mg | ORAL_CAPSULE | Freq: Two times a day (BID) | ORAL | Status: DC
  Administered 2016-09-09 – 2016-09-10 (×3): 300 mg via ORAL
  Filled 2016-09-09 (×3): qty 1

## 2016-09-09 MED ORDER — LEVOTHYROXINE SODIUM 125 MCG PO TABS
250.0000 ug | ORAL_TABLET | Freq: Every day | ORAL | Status: DC
  Administered 2016-09-09 – 2016-09-10 (×2): 250 ug via ORAL
  Filled 2016-09-09 (×3): qty 2

## 2016-09-09 MED ORDER — INSULIN ASPART 100 UNIT/ML ~~LOC~~ SOLN
0.0000 [IU] | Freq: Every day | SUBCUTANEOUS | Status: DC
  Administered 2016-09-09: 5 [IU] via SUBCUTANEOUS

## 2016-09-09 MED ORDER — DEXTROSE 5 % IV SOLN
1.0000 g | Freq: Once | INTRAVENOUS | Status: AC
  Administered 2016-09-09: 1 g via INTRAVENOUS
  Filled 2016-09-09: qty 10

## 2016-09-09 MED ORDER — CYCLOBENZAPRINE HCL 10 MG PO TABS
10.0000 mg | ORAL_TABLET | Freq: Three times a day (TID) | ORAL | Status: DC
  Administered 2016-09-09 – 2016-09-10 (×4): 10 mg via ORAL
  Filled 2016-09-09 (×4): qty 1

## 2016-09-09 MED ORDER — ONDANSETRON HCL 4 MG/2ML IJ SOLN
4.0000 mg | Freq: Four times a day (QID) | INTRAMUSCULAR | Status: DC | PRN
Start: 2016-09-09 — End: 2016-09-10

## 2016-09-09 MED ORDER — OXYCODONE HCL 5 MG PO TABS
10.0000 mg | ORAL_TABLET | Freq: Four times a day (QID) | ORAL | Status: DC | PRN
  Administered 2016-09-09 – 2016-09-10 (×4): 10 mg via ORAL
  Filled 2016-09-09 (×4): qty 2

## 2016-09-09 MED ORDER — ACETAMINOPHEN 650 MG RE SUPP: 650.0000 mg | Freq: Four times a day (QID) | RECTAL | Status: DC | PRN

## 2016-09-09 MED ORDER — PANTOPRAZOLE SODIUM 40 MG PO TBEC
40.0000 mg | DELAYED_RELEASE_TABLET | Freq: Every day | ORAL | Status: DC
  Administered 2016-09-09 – 2016-09-10 (×2): 40 mg via ORAL
  Filled 2016-09-09 (×2): qty 1

## 2016-09-09 MED ORDER — INSULIN ASPART 100 UNIT/ML ~~LOC~~ SOLN
0.0000 [IU] | SUBCUTANEOUS | Status: DC
  Administered 2016-09-09: 9 [IU] via SUBCUTANEOUS

## 2016-09-09 MED ORDER — PRAMIPEXOLE DIHYDROCHLORIDE 0.125 MG PO TABS
0.1250 mg | ORAL_TABLET | Freq: Every day | ORAL | Status: DC
  Administered 2016-09-09: 0.125 mg via ORAL
  Filled 2016-09-09: qty 1

## 2016-09-09 MED ORDER — MONTELUKAST SODIUM 10 MG PO TABS
10.0000 mg | ORAL_TABLET | Freq: Every day | ORAL | Status: DC
  Administered 2016-09-09 – 2016-09-10 (×2): 10 mg via ORAL
  Filled 2016-09-09 (×2): qty 1

## 2016-09-09 MED ORDER — HYDROMORPHONE HCL 1 MG/ML IJ SOLN
1.0000 mg | Freq: Once | INTRAMUSCULAR | Status: AC
  Administered 2016-09-09: 1 mg via INTRAVENOUS
  Filled 2016-09-09: qty 1

## 2016-09-09 MED ORDER — SUCRALFATE 1 G PO TABS
1.0000 g | ORAL_TABLET | Freq: Three times a day (TID) | ORAL | Status: DC
  Administered 2016-09-09 – 2016-09-10 (×5): 1 g via ORAL
  Filled 2016-09-09 (×5): qty 1

## 2016-09-09 MED ORDER — HYDROCODONE-ACETAMINOPHEN 5-325 MG PO TABS
1.0000 | ORAL_TABLET | ORAL | Status: DC | PRN
  Administered 2016-09-09: 1 via ORAL
  Filled 2016-09-09: qty 1

## 2016-09-09 MED ORDER — MUPIROCIN 2 % EX OINT
1.0000 "application " | TOPICAL_OINTMENT | Freq: Two times a day (BID) | CUTANEOUS | Status: DC
  Administered 2016-09-09 – 2016-09-10 (×3): 1 via NASAL
  Filled 2016-09-09: qty 22

## 2016-09-09 MED ORDER — DOCUSATE SODIUM 100 MG PO CAPS
200.0000 mg | ORAL_CAPSULE | Freq: Two times a day (BID) | ORAL | Status: DC
  Administered 2016-09-09 – 2016-09-10 (×3): 200 mg via ORAL
  Filled 2016-09-09 (×3): qty 2

## 2016-09-09 MED ORDER — INSULIN ASPART PROT & ASPART (70-30 MIX) 100 UNIT/ML ~~LOC~~ SUSP
25.0000 [IU] | Freq: Two times a day (BID) | SUBCUTANEOUS | Status: DC
  Administered 2016-09-09 – 2016-09-10 (×2): 25 [IU] via SUBCUTANEOUS
  Filled 2016-09-09 (×2): qty 10

## 2016-09-09 MED ORDER — SENNOSIDES-DOCUSATE SODIUM 8.6-50 MG PO TABS: 1.0000 | ORAL_TABLET | Freq: Every evening | ORAL | Status: DC | PRN

## 2016-09-09 MED ORDER — INSULIN GLARGINE 100 UNIT/ML ~~LOC~~ SOLN
15.0000 [IU] | Freq: Every day | SUBCUTANEOUS | Status: DC
  Filled 2016-09-09: qty 0.15

## 2016-09-09 MED ORDER — OXYBUTYNIN CHLORIDE 5 MG PO TABS
5.0000 mg | ORAL_TABLET | Freq: Two times a day (BID) | ORAL | Status: DC
  Administered 2016-09-09 – 2016-09-10 (×3): 5 mg via ORAL
  Filled 2016-09-09 (×3): qty 1

## 2016-09-09 MED ORDER — CHLORHEXIDINE GLUCONATE 0.12 % MT SOLN
15.0000 mL | Freq: Two times a day (BID) | OROMUCOSAL | Status: DC
  Administered 2016-09-09 (×2): 15 mL via OROMUCOSAL
  Filled 2016-09-09 (×3): qty 15

## 2016-09-09 MED ORDER — ZOLPIDEM TARTRATE 5 MG PO TABS
5.0000 mg | ORAL_TABLET | Freq: Every evening | ORAL | Status: DC | PRN
  Administered 2016-09-09: 5 mg via ORAL
  Filled 2016-09-09: qty 1

## 2016-09-09 MED ORDER — CHLORHEXIDINE GLUCONATE CLOTH 2 % EX PADS
6.0000 | MEDICATED_PAD | Freq: Every day | CUTANEOUS | Status: DC
  Administered 2016-09-10: 6 via TOPICAL

## 2016-09-09 MED ORDER — LUBIPROSTONE 24 MCG PO CAPS
24.0000 ug | ORAL_CAPSULE | Freq: Two times a day (BID) | ORAL | Status: DC
  Administered 2016-09-09 – 2016-09-10 (×2): 24 ug via ORAL
  Filled 2016-09-09 (×3): qty 1

## 2016-09-09 MED ORDER — ENOXAPARIN SODIUM 40 MG/0.4ML ~~LOC~~ SOLN
40.0000 mg | SUBCUTANEOUS | Status: DC
  Administered 2016-09-09 – 2016-09-10 (×2): 40 mg via SUBCUTANEOUS
  Filled 2016-09-09 (×2): qty 0.4

## 2016-09-09 MED ORDER — SENNOSIDES-DOCUSATE SODIUM 8.6-50 MG PO TABS
1.0000 | ORAL_TABLET | Freq: Two times a day (BID) | ORAL | Status: DC
  Administered 2016-09-09 (×2): 1 via ORAL
  Filled 2016-09-09 (×4): qty 1

## 2016-09-09 MED ORDER — ACETAMINOPHEN 325 MG PO TABS
650.0000 mg | ORAL_TABLET | Freq: Four times a day (QID) | ORAL | Status: DC | PRN
  Administered 2016-09-09: 650 mg via ORAL
  Filled 2016-09-09: qty 2

## 2016-09-09 MED ORDER — BUPROPION HCL ER (XL) 300 MG PO TB24
300.0000 mg | ORAL_TABLET | Freq: Every day | ORAL | Status: DC
  Administered 2016-09-09 – 2016-09-10 (×2): 300 mg via ORAL
  Filled 2016-09-09 (×2): qty 1

## 2016-09-09 MED ORDER — INSULIN ASPART 100 UNIT/ML ~~LOC~~ SOLN
0.0000 [IU] | Freq: Three times a day (TID) | SUBCUTANEOUS | Status: DC
  Administered 2016-09-09 (×2): 20 [IU] via SUBCUTANEOUS
  Administered 2016-09-10: 11 [IU] via SUBCUTANEOUS
  Administered 2016-09-10: 20 [IU] via SUBCUTANEOUS

## 2016-09-09 MED ORDER — INSULIN GLARGINE 100 UNIT/ML ~~LOC~~ SOLN: 15.0000 [IU] | Freq: Every day | SUBCUTANEOUS | Status: DC

## 2016-09-09 MED ORDER — INSULIN DETEMIR 100 UNIT/ML ~~LOC~~ SOLN
15.0000 [IU] | Freq: Every day | SUBCUTANEOUS | Status: DC
  Administered 2016-09-09 – 2016-09-10 (×2): 15 [IU] via SUBCUTANEOUS
  Filled 2016-09-09 (×2): qty 0.15

## 2016-09-09 NOTE — ED Notes (Signed)
MD at bedside. 

## 2016-09-09 NOTE — ED Notes (Signed)
Bed: [id number] Expected date:  Expected time:  Means of arrival:  Comments: EMS 55 yo female multiple complaints/tresspassed from [facility]

## 2016-09-09 NOTE — Progress Notes (Signed)
CRITICAL VALUE ALERT  Critical value received:  MRSA positive nares  Date of notification:  09-09-16  Time of notification:  11:55  Critical value read back: yes  Nurse who received alert:  Silvano BilisKiristin Ervine Witucki, RN  MD notified (1st page):  Dr. Benjamine MolaVann  Time of first page:    MD notified (2nd page):  Time of second page:  Responding MD:  Dr. Benjamine MolaVann  Time MD responded:  12:00
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
## 2016-09-09 NOTE — Progress Notes (Signed)
Patient reports she want to eat her lunch and then write down things she wants to discuss. LCSWA left phone number for patient to call after she is done with lunch.

## 2016-09-09 NOTE — Progress Notes (Signed)
[id number]/Kivon Aprea,BSN,RN3,CCM: [phone]: abn form reviewed and signed by patient.
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
## 2016-09-09 NOTE — Progress Notes (Signed)
Received signout from Dr. Rhunette CroftNanavati [name] is a 55 year old female with pmh of COPD oxygen dependent, chronic suprapubic catheter, DM type 2, MDR UTIs, and RLS; who presents with multiple complaints. Complains of flank pain 3 weeks, broken oxygen concentrator, father dying at [facility], patient unable to travel back and forth to visit as she lives in [place]. UA positive. Urine culture pending. Antibiotics of Rocephin and Dilaudid 1 dose given in ED. Will need social work/case management to help assist. Admit to a MedSurg bed

## 2016-09-09 NOTE — Progress Notes (Signed)
[id number]/Jaaziah Schulke,BSN,RN3,CCM:TCT-[place] for o2 vendor- states that it is [employer] patient/[facility]ct-[facility] patient no such person has an account through the company.

## 2016-09-09 NOTE — Consult Note (Signed)
WOC Nurse wound consult note Reason for Consult: Areas of skin fold moisture collection, specifically intertriginous dermatitis of the inframammary, inguinal and sub pannicular skin folds.  Also noted are two firm papules (boils) of which the MD is aware and who has provided Nursing with guidance for care via verbal orders (i.e., warm compresses several times each day). Wound type: Moisture associated skin damage (MASD), specifically intertriginous dermatitis Pressure Ulcer POA: No Measurement: (N/A); length of skin folds Wound bed:No open areas Drainage (amount, consistency, odor) None Periwound: Erythematous, macerated in the sub pannicular areas Dressing procedure/placement/frequency: I have provided patient with a therapeutic mattress with low air loss feature until we can get moisture better managed.  Topically, Nursing will employ our house antimicrobial wicking textile (InterDry Ag+) in the skin folds to both manage bacteria and relocate moisture from the folds.  Turning and repositioning efforts are in place and patient used a motorized wheelchair for mobility, but I will provide a pressure redistribution chair cushion for her use while OOB in the recliner chair in her room. WOC nursing team will not follow, but will remain available to this patient, the nursing and medical teams.  Please re-consult if needed. Thanks, Ladona MowLaurie Derika Eckles, MSN, RN, GNP, Hans EdenCWOCN, CWON-AP, FAAN  Pager# [phone]
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
## 2016-09-09 NOTE — ED Notes (Signed)
Pt's catheter bag was changed. Will collect urine sample when there is enough in the new bag to send to lab

## 2016-09-10 DIAGNOSIS — G894 Chronic pain syndrome: Secondary | ICD-10-CM

## 2016-09-10 DIAGNOSIS — Z993 Dependence on wheelchair: Secondary | ICD-10-CM

## 2016-09-10 DIAGNOSIS — J449 Chronic obstructive pulmonary disease, unspecified: Secondary | ICD-10-CM | POA: Diagnosis not present

## 2016-09-10 DIAGNOSIS — N3010 Interstitial cystitis (chronic) without hematuria: Secondary | ICD-10-CM | POA: Diagnosis not present

## 2016-09-10 DIAGNOSIS — E662 Morbid (severe) obesity with alveolar hypoventilation: Secondary | ICD-10-CM

## 2016-09-10 LAB — BASIC METABOLIC PANEL
ANION GAP: 8 (ref 5–15)
BUN: 15 mg/dL (ref 6–20)
CHLORIDE: 100 mmol/L — AB (ref 101–111)
CO2: 29 mmol/L (ref 22–32)
Calcium: 8.9 mg/dL (ref 8.9–10.3)
Creatinine, Ser: 0.85 mg/dL (ref 0.44–1.00)
GFR calc non Af Amer: >60 mL/min (ref >60)
Glucose, Bld: 337 mg/dL — ABNORMAL HIGH (ref 65–99)
Potassium: 4.1 mmol/L (ref 3.5–5.1)
Sodium: 137 mmol/L (ref 135–145)

## 2016-09-10 LAB — CBC
HEMATOCRIT: 35.5 % — AB (ref 36.0–46.0)
HEMOGLOBIN: 11.5 g/dL — AB (ref 12.0–15.0)
MCH: 27.7 pg (ref 26.0–34.0)
MCHC: 32.4 g/dL (ref 30.0–36.0)
MCV: 85.5 fL (ref 78.0–100.0)
Platelets: 189 10*3/uL (ref 150–400)
RBC: 4.15 MIL/uL (ref 3.87–5.11)
RDW: 14.8 % (ref 11.5–15.5)
WBC: 7.1 10*3/uL (ref 4.0–10.5)

## 2016-09-10 LAB — GLUCOSE, CAPILLARY
Glucose-Capillary: 360 mg/dL — ABNORMAL HIGH (ref 65–99)
Glucose-Capillary: 251 mg/dL — ABNORMAL HIGH (ref 65–99)

## 2016-09-10 MED ORDER — INSULIN DETEMIR 100 UNIT/ML ~~LOC~~ SOLN: 20.0000 [IU] | Freq: Every day | SUBCUTANEOUS | 0 refills | Status: AC

## 2016-09-10 MED ORDER — INSULIN ASPART PROT & ASPART (70-30 MIX) 100 UNIT/ML ~~LOC~~ SUSP
30.0000 [IU] | Freq: Two times a day (BID) | SUBCUTANEOUS | Status: DC
  Filled 2016-09-10: qty 10

## 2016-09-10 MED ORDER — INSULIN DETEMIR 100 UNIT/ML ~~LOC~~ SOLN
20.0000 [IU] | Freq: Every day | SUBCUTANEOUS | Status: DC
Start: 2016-09-11 — End: 2016-09-10

## 2016-09-10 MED ORDER — CHLORHEXIDINE GLUCONATE 0.12 % MT SOLN: 15.0000 mL | Freq: Two times a day (BID) | OROMUCOSAL | 0 refills | Status: AC

## 2016-09-10 MED ORDER — SENNOSIDES-DOCUSATE SODIUM 8.6-50 MG PO TABS
1.0000 | ORAL_TABLET | Freq: Two times a day (BID) | ORAL | Status: DC
  Administered 2016-09-10: 1 via ORAL
  Filled 2016-09-10: qty 1

## 2016-09-10 NOTE — Progress Notes (Signed)
LCSWA met with patient at bedside. Patient reports she is frustrated because [facility] will not allow her to stay at the facility overnight. Patient report her cousin [name] has arranged to place her in a hotel for a couple of nights. Patient reports she has no way to get back to [facility] to get her motorized wheel chair, then get to the hotel. Patient reports she needs more 02, and a concentrator. LCSWA provided emotional support encouraged the patient the best possible safe discharge is back to her ALF[name]. Patient is refuses to go back to ALF.  LCSWA discussed with Supervisor Shawna Orleans, recommended patient safe D/C back to ALF and get family involve to assist.   LCSWA contacted pt. [place], [phone]. Patient Cousin reports he was aware of the patient here at the hospital. He reports he is will assist the patient with a hotel room on [address], assist her with food and getting her back to the ALF . He cannot transport her in his car due to patient size. He reports he will assist the patient, if the hospital can provide a manuel wheelchair.  Informed Tamsen Roers would consult case management.   LCSWA spoke to RN-Abby and Nursing Director Joycelyn Schmid about patient and family concerns.   LCSWA contacted RN-Case Manager-Rhonda about the manuel chair/ 02. Waiting for return call.

## 2016-09-10 NOTE — Discharge Summary (Signed)
Physician Discharge Summary  [name] ZOX:[id number] DOB: [birth year] DOA: 09/09/2016  PCP: No primary care provider on file.  Admit date: 09/09/2016 Discharge date: 09/10/2016  Equipment/Devices: oxygen 3.5 L/m  Discharge Condition: STABLE CODE STATUS: DNR  Diet recommendation: Heart Healthy / Carb Modified  Brief/Interim Summary: Chief Complaint: kicked out of [facility] for trespassing  HPI: [name] is a 55 y.o. female with medical history significant of chronic pain, O2 dependant COPD, and diabetes. Patient is dependent on wheelchair to get around.  Patient apparently has removed all her belongings from her assisted living in [place]. Has told them she is not planning on returning. Patient got on a train to [place], than above, then wrote her motorized wheelchair 1-1/2 miles to [place] to see her "dying parents". Patient states that her demented mother told her she could stay with her in her room at [facility]. The patient arrived at [facility] her O2 canister had been damaged and was unable to get hooked to her oxygen. Per the record and patient was removed by the [facility] and brought to the ER for trespassing. Patient has also been complaining of bilateral kidney pain 3 weeks, she endorses chills and fever all of which are subjective. Patient states her pain has not been adequately controlled in her assisted living and states she has 90 diagnosis is that require pain management.  ED Course: In the ER patient was essentially said to be here because the nursing home were apparently told her she could not stay there overnight and her O2 canister was damage. They also thought that perhaps she could be having a urinary tract infection/pyelonephritis. Hospitalists were asked to observe.    The patient had an ultrasound of her kidneys and there was no pyelonephritis found.  Her blood sugars were elevated and her insulin doses were adjusted.     Patient apparently has removed all her belongings from her assisted living in [place]. Has told them she is not planning on returning. Patient got on a train to [place], than above, then wrote her motorized wheelchair 1-1/2 miles to [place] to see her "dying parents". Patient states that her demented mother told her she could stay with her in her room at [facility]. The patient arrived at [facility] her O2 canister had been damaged and was unable to get hooked to her oxygen. Per the record and patient was removed by the [facility] and brought to the ER for trespassing. Will consult social worker to figure out discharge plan for patient.  Questionable pyelonephritis/UTI -Urine culture pending but patient is colonized and does not have a treatable UTI or pyelonephritis.  She has normal WBC, no fever or chills or flank pain.   -renal ultrasound negative for pyelonephritis -will hold on abx -has chronic indwelling foley catheter  Chronic pain -avoided IV pain meds, pt to resume her home pain management regimen, no prescriptions given for pain medications at discharge.   O2 dependant COPD - Continue [place] 3.5L per minute home oxygen  DM -slightly adjusted her regimen, increased her levemir to 20 units and increased NPH 70/30 to 30 units BID with meals.  - Pt advised to monitor blood glucose 5 times per day.   DVT prophylaxis: lovenox Code Status: DNR Family Communication: patient Disposition Plan: has removed all belongings from ALF and brought them to [facility] with no place to live Consults called: social work and Futures trader assisted with social issues and discharge planning   Discharge Diagnoses:  Active Problems:  Chronic pain syndrome   Morbid obesity (HCC)   COPD (chronic obstructive pulmonary disease) (HCC)   Chronic interstitial cystitis   Extreme obesity with alveolar hypoventilation (HCC)   Dependent on wheelchair  Discharge  Instructions    Medication List    STOP taking these medications   insulin glargine 100 UNIT/ML injection Commonly known as:  LANTUS   polyethylene glycol packet Commonly known as:  MIRALAX / GLYCOLAX     TAKE these medications   bisacodyl 5 MG EC tablet Commonly known as:  DULCOLAX Take 1 tablet (5 mg total) by mouth every other day.   buPROPion 300 MG 24 hr tablet Commonly known as:  WELLBUTRIN XL Take 300 mg by mouth daily.   calcium carbonate 750 MG chewable tablet Commonly known as:  TUMS EX Chew 1,500 mg by mouth every 12 (twelve) hours as needed for heartburn (may self medicate & keep at bedside).   chlorhexidine 0.12 % solution Commonly known as:  PERIDEX Use as directed 15 mLs in the mouth or throat 2 (two) times daily.   cholecalciferol 1000 units tablet Commonly known as:  VITAMIN D Take 5,000 Units by mouth daily.   cyclobenzaprine 10 MG tablet Commonly known as:  FLEXERIL Take 10 mg by mouth 3 (three) times daily.   diazepam 5 MG tablet Commonly known as:  VALIUM Take 1-2 tablets (5-10 mg total) by mouth every 12 (twelve) hours as needed for anxiety (TAKES 5MG  IN AM AND 10MG  IN PM, ONLY AS NEEDED FOR ANXIETY). What changed:  how much to take  when to take this  reasons to take this   DSS 100 MG Caps Take 200 mg by mouth 2 (two) times daily.   Fluticasone-Salmeterol 500-50 MCG/DOSE Aepb Commonly known as:  ADVAIR Inhale 1 puff into the lungs 2 (two) times daily.   FRESHKOTE 2.7-2 % Soln Generic drug:  Polyvinyl Alcohol-Povidone Place 1 drop into both eyes 2 (two) times daily.   gabapentin 300 MG capsule Commonly known as:  NEURONTIN Take 300 mg by mouth 2 (two) times daily.   insulin detemir 100 UNIT/ML injection Commonly known as:  LEVEMIR Inject 0.2 mLs (20 Units total) into the skin daily. Start taking on:  09/11/2016   insulin lispro 100 UNIT/ML injection Commonly known as:  HUMALOG Inject 1-6 Units into the skin 4 (four) times  daily -  before meals and at bedtime. Sliding Scale -  141-190= 1 U 191-240= 2 U 241-290= 3 U 341-390= 4 U 391-400= 6 U OVER 400 -- CALL MD   insulin NPH-regular Human (70-30) 100 UNIT/ML injection Commonly known as:  NOVOLIN 70/30 Inject 25 Units into the skin 2 (two) times daily with a meal.   levothyroxine 125 MCG tablet Commonly known as:  SYNTHROID, LEVOTHROID Take 250 mcg by mouth daily before breakfast.   lubiprostone 24 MCG capsule Commonly known as:  AMITIZA Take 24 mcg by mouth 2 (two) times daily with a meal.   montelukast 10 MG tablet Commonly known as:  SINGULAIR Take 10 mg by mouth daily.   neomycin-bacitracin-polymyxin ointment Commonly known as:  NEOSPORIN Apply 1 application topically daily. Clean cath site & apply daily   nitroGLYCERIN 0.4 MG SL tablet Commonly known as:  NITROSTAT Place 0.4 mg under the tongue every 5 (five) minutes as needed for chest pain.   nystatin powder Generic drug:  nystatin Apply 1 g topically daily. Apply to folds   nystatin cream Commonly known as:  MYCOSTATIN Apply 1 application topically  daily. Apply to underside of breasts and abdomen   omeprazole 20 MG capsule Commonly known as:  PRILOSEC Take 20 mg by mouth daily.   oxybutynin 5 MG tablet Commonly known as:  DITROPAN Take 5 mg by mouth 2 (two) times daily.   Oxycodone HCl 10 MG Tabs Take one tablet by mouth every 4 hours as needed for pain What changed:  how much to take  how to take this  when to take this  additional instructions   PARoxetine 25 MG 24 hr tablet Commonly known as:  PAXIL-CR Take 1 tablet (25 mg total) by mouth daily. What changed:  when to take this   potassium chloride 10 MEQ CR capsule Commonly known as:  MICRO-K Take 10 mEq by mouth daily.   pramipexole 0.125 MG tablet Commonly known as:  MIRAPEX Take 0.125 mg by mouth at bedtime.   sennosides-docusate sodium 8.6-50 MG tablet Commonly known as:  SENOKOT-S Take 1 tablet by  mouth 2 (two) times daily.   sucralfate 1 g tablet Commonly known as:  CARAFATE Take 1 g by mouth 4 (four) times daily -  with meals and at bedtime.       Allergies  Allergen Reactions  . Ibuprofen Other (See Comments) and Shortness Of Breath  . Sulfa Antibiotics Other (See Comments), Itching and Shortness Of Breath  . Levofloxacin Nausea Only    Other fluoroquinolones OK.  . Lisinopril Other (See Comments)  . Penicillins Other (See Comments) and Hives   Procedures/Studies: [facility] Renal  Result Date: 09/09/2016 CLINICAL DATA:  Pyelonephritis.  Bilateral flank pain for 3 weeks. EXAM: RENAL / URINARY TRACT ULTRASOUND COMPLETE COMPARISON:  CT abdomen and pelvis 12/10/2015. FINDINGS: Right Kidney: Length: 12.7 cm. Echogenicity within normal limits. No mass or hydronephrosis visualized. Left Kidney: Length: 13.5 cm. Echogenicity within normal limits. No mass or hydronephrosis visualized. Bladder: Decompressed with a suprapubic catheter in place. IMPRESSION: Negative for hydronephrosis.  Negative exam. Electronically Signed   By: Drusilla Kannerhomas  Dalessio M.D.   On: 09/09/2016 10:32     Subjective: Pt has remained stable while observed in hospital.  No active acute medical issues.    Discharge Exam: Vitals:   09/10/16 0538 09/10/16 0745  BP: (!) 157/78 135/85  Pulse: 96 87  Resp: 20 20  Temp: 98.5 F (36.9 C) 98.7 F (37.1 C)   Vitals:   09/09/16 1424 09/09/16 2015 09/10/16 0538 09/10/16 0745  BP:  (!) 177/86 (!) 157/78 135/85  Pulse:  96 96 87  Resp:  18 20 20   Temp:  98.5 F (36.9 C) 98.5 F (36.9 C) 98.7 F (37.1 C)  TempSrc:  Oral Oral Oral  SpO2:  100% 96% 97%  Weight: 130.7 kg (288 lb 2.3 oz)     Height: 5\' 5"  (1.651 m)       General: Pt is alert, awake, not in acute distress Eyes: PERRL, lids and conjunctivae normal ENMT: Mucous membranes are moist. Posterior pharynx clear of any exudate or lesions.Normal dentition.  Neck: normal, supple, no masses, no  thyromegaly Respiratory: diminished due to body habitus Cardiovascular: Regular rate and rhythm, no murmurs / rubs / gallops. No extremity edema. 2+ pedal pulses. No carotid bruits.  Abdomen:  Tender on flanks, no masses palpated. No hepatosplenomegaly. Bowel sounds positive. obese  Neurologic: CN 2-12 grossly intact. Sensation intact, DTR normal. Strength 5/5 in all 4.    The results of significant diagnostics from this hospitalization (including imaging, microbiology, ancillary and laboratory) are listed below for  reference.     Microbiology: Recent Results (from the past 240 hour(s))  MRSA PCR Screening     Status: Abnormal   Collection Time: 09/09/16  7:56 AM  Result Value Ref Range Status   MRSA by PCR POSITIVE (A) NEGATIVE Final    Comment:        The GeneXpert MRSA Assay (FDA approved for NASAL specimens only), is one component of a comprehensive MRSA colonization surveillance program. It is not intended to diagnose MRSA infection nor to guide or monitor treatment for MRSA infections. RESULT CALLED TO, READ BACK BY AND VERIFIED WITH: RYAN,K @ 1154 ON [id number] BY POTEAT,S      Labs: BNP (last 3 results) No results for input(s): BNP in the last 8760 hours. Basic Metabolic Panel:  Recent Labs Lab 09/09/16 0258 09/10/16 0552  NA 134* 137  K 4.0 4.1  CL 96* 100*  CO2 29 29  GLUCOSE 433* 337*  BUN 16 15  CREATININE 0.94 0.85  CALCIUM 9.2 8.9   Liver Function Tests:  Recent Labs Lab 09/09/16 0258  AST 13*  ALT 16  ALKPHOS 87  BILITOT 0.5  PROT 7.7  ALBUMIN 4.2   No results for input(s): LIPASE, AMYLASE in the last 168 hours. No results for input(s): AMMONIA in the last 168 hours. CBC:  Recent Labs Lab 09/09/16 0258 09/10/16 0552  WBC 9.5 7.1  NEUTROABS 6.5  --   HGB 12.3 11.5*  HCT 38.9 35.5*  MCV 86.6 85.5  PLT 216 189   Cardiac Enzymes: No results for input(s): CKTOTAL, CKMB, CKMBINDEX, TROPONINI in the last 168 hours. BNP: Invalid  input(s): POCBNP CBG:  Recent Labs Lab 09/09/16 0741 09/09/16 1131 09/09/16 1645 09/09/16 2027 09/10/16 0755  GLUCAP 388* 421* 382* 323* 360*   D-Dimer No results for input(s): DDIMER in the last 72 hours. Hgb A1c No results for input(s): HGBA1C in the last 72 hours. Lipid Profile No results for input(s): CHOL, HDL, LDLCALC, TRIG, CHOLHDL, LDLDIRECT in the last 72 hours. Thyroid function studies No results for input(s): TSH, T4TOTAL, T3FREE, THYROIDAB in the last 72 hours.  Invalid input(s): FREET3 Anemia work up No results for input(s): VITAMINB12, FOLATE, FERRITIN, TIBC, IRON, RETICCTPCT in the last 72 hours. Urinalysis    Component Value Date/Time   COLORURINE YELLOW 09/09/2016 0326   APPEARANCEUR CLEAR 09/09/2016 0326   LABSPEC 1.041 (H) 09/09/2016 0326   PHURINE 5.5 09/09/2016 0326   GLUCOSEU >1000 (A) 09/09/2016 0326   HGBUR NEGATIVE 09/09/2016 0326   BILIRUBINUR NEGATIVE 09/09/2016 0326   KETONESUR NEGATIVE 09/09/2016 0326   PROTEINUR NEGATIVE 09/09/2016 0326   UROBILINOGEN 1.0 11/10/2015 1230   NITRITE POSITIVE (A) 09/09/2016 0326   LEUKOCYTESUR SMALL (A) 09/09/2016 0326   Sepsis Labs Invalid input(s): PROCALCITONIN,  WBC,  LACTICIDVEN Microbiology Recent Results (from the past 240 hour(s))  MRSA PCR Screening     Status: Abnormal   Collection Time: 09/09/16  7:56 AM  Result Value Ref Range Status   MRSA by PCR POSITIVE (A) NEGATIVE Final    Comment:        The GeneXpert MRSA Assay (FDA approved for NASAL specimens only), is one component of a comprehensive MRSA colonization surveillance program. It is not intended to diagnose MRSA infection nor to guide or monitor treatment for MRSA infections. RESULT CALLED TO, READ BACK BY AND VERIFIED WITH: RYAN,K @ 1154 ON [id number] BY POTEAT,S    Time coordinating discharge: 24 mins  SIGNED: Standley Dakins, MD  [facility] 09/10/2016,  12:31 PM Pager   If 7PM-7AM, please contact  night-coverage [email or web address] Password [email or web address]

## 2016-09-10 NOTE — Progress Notes (Signed)
Pt was informed this morning of pending discharge. Pt was made aware from social work that there were 2 options: to be transferred back to her facility or for her family member to come pick her up and transport her to her next destination. Pt informed nursing staff [facility] and escorted by [employer] a few nights prior. Pt was informed that we would not provide transportation to [facility]. The nursing staff informed the pt that we could provide a manual wheelchair for the pt to transfer until she was able to get her electric wheelchair back. Case management provided the pt with the manual wheelchair prior to discharge. [facility] also provided the pt with a oxygen tank prior to discharge. Pt became very irritated due to the fact that her oxygen tank would only last her 3-3.5 hours. She became irritated and was yelling at staff that she was not going make it and stated "I will die with oxygen". Case management called around to the oxygen providers and was able to provide the pt with her oxygen compressor. [facility] delivered the compressor to the room. Pt paid for taxi to [facility] so that she would be able to pick up her electric wheelchair along with her other belongings. Discharge instructions were given to the pt and reviewed the instructions with her. No questions or concerns at this time.  Nil Xiong W Estephanie Hubbs, RN

## 2016-09-10 NOTE — Progress Notes (Signed)
Date:  September 10, 2016 [name], BSN, CrawfordRN3, ConnecticutCCM   [phone] Travel 02 through [facility]-Jermine notified

## 2016-09-10 NOTE — Progress Notes (Signed)
Inpatient Diabetes Program Recommendations  AACE/ADA: New Consensus Statement on Inpatient Glycemic Control (2015)  Target Ranges:  Prepandial:   less than 140 mg/dL      Peak postprandial:   less than 180 mg/dL (1-2 hours)      Critically ill patients:  140 - 180 mg/dL   Lab Results  Component Value Date   GLUCAP 360 (H) 09/10/2016   HGBA1C 6.6 (H) 11/09/2015    Review of Glycemic Control  Diabetes history: DM2 Outpatient Diabetes medications: NPH 70/30 25 units bid, Lantus 15 units QHS, Humalog 1-6 qid Current orders for Inpatient glycemic control: same as above, + Novolog resistant tidwc and hs  Blood sugars continue in the 300-400s.  Needs insulin adjustment.  Inpatient Diabetes Program Recommendations:    Increase Lantus to 20 units QD Increase 70/30 to 30 units bid Need updated HgbA1C.  Will follow.Thank you. Ailene Ardshonda Oluwaseun Cremer, RD, LDN, CDE Inpatient Diabetes Coordinator [phone]

## 2016-09-10 NOTE — Care Management Obs Status (Signed)
MEDICARE OBSERVATION STATUS NOTIFICATION   Patient Details  Name: [name] MRN: [id number] Date of Birth: 04/22/1961   Medicare Observation Status Notification Given:  Other (see comment)refused to sign    [name], Mikisha Roseland Lynn, RN 09/10/2016, 12:44 PM

## 2016-09-10 NOTE — Progress Notes (Signed)
[id number]/Rhonda Davis,BSN,RN3,CCM:  o2 concentrator obtained for patient.

## 2016-09-10 NOTE — Care Management Note (Signed)
Case Management Note  Patient Details  Name: [name] MRN: [id number] Date of Birth: 09/02/1961  Subjective/Objective:                    Action/Plan:   Expected Discharge Date:   (UNKNOWN)               Expected Discharge Plan:     In-House Referral:     Discharge planning Services   dme  Post Acute Care Choice:    Choice offered to:     DME Arranged:   oxygen DME Agency:   [facility] d[facility]  HH Arranged:    HH Agency:     Status of Service:   completed  If discussed at [employer] Length of [employer] Meetings, dates discussed:    Additional Comments:  [name], [name], RN 09/10/2016, 3:50 PM

## 2016-09-10 NOTE — Progress Notes (Signed)
Date: September 10, 2016 Discharge orders checked for needs. Manuel wheelchair with seat cushion ordered from [facility] Felicie Mornjermane Rhonda Davis, RN, BSN, [place]M   [phone]

## 2016-09-10 NOTE — Progress Notes (Signed)
The patient has received [employer] Walls and O2. The patient cousin [name] informed. [name] reports his wife-[phone] will meet patient over at [facility], to gather belongings, transport her to hotel room,  assist her with all needs for the remainder of the weekend. They will arrange for pt. to Safely return to her ALF.   Patient reports she has her own money to pay for the taxi. [employer] called.  Patient does not express any other concerns at this time.

## 2016-09-11 LAB — URINE CULTURE: Culture: 60000 — AB
